# Patient Record
Sex: Female | Born: 1995 | Hispanic: Yes | Marital: Single | Smoking: Never smoker
Health system: Southern US, Community
[De-identification: ages and names within clinical notes are randomized; demographics above are authoritative.]

## PROBLEM LIST (undated history)

## (undated) DIAGNOSIS — J45909 Unspecified asthma, uncomplicated: Secondary | ICD-10-CM

## (undated) DIAGNOSIS — Z923 Personal history of irradiation: Secondary | ICD-10-CM

## (undated) DIAGNOSIS — R519 Headache, unspecified: Secondary | ICD-10-CM

## (undated) DIAGNOSIS — E119 Type 2 diabetes mellitus without complications: Secondary | ICD-10-CM

## (undated) DIAGNOSIS — G43909 Migraine, unspecified, not intractable, without status migrainosus: Secondary | ICD-10-CM

## (undated) DIAGNOSIS — F32A Depression, unspecified: Secondary | ICD-10-CM

## (undated) DIAGNOSIS — F419 Anxiety disorder, unspecified: Secondary | ICD-10-CM

## (undated) DIAGNOSIS — D500 Iron deficiency anemia secondary to blood loss (chronic): Secondary | ICD-10-CM

## (undated) DIAGNOSIS — Z9221 Personal history of antineoplastic chemotherapy: Secondary | ICD-10-CM

## (undated) DIAGNOSIS — D649 Anemia, unspecified: Secondary | ICD-10-CM

## (undated) DIAGNOSIS — C539 Malignant neoplasm of cervix uteri, unspecified: Secondary | ICD-10-CM

## (undated) DIAGNOSIS — N838 Other noninflammatory disorders of ovary, fallopian tube and broad ligament: Secondary | ICD-10-CM

## (undated) HISTORY — PX: TONSILLECTOMY: SUR1361

## (undated) HISTORY — DX: Personal history of irradiation: Z92.3

## (undated) HISTORY — DX: Morbid (severe) obesity due to excess calories: E66.01

## (undated) HISTORY — DX: Other noninflammatory disorders of ovary, fallopian tube and broad ligament: N83.8

---

## 2018-11-10 ENCOUNTER — Other Ambulatory Visit: Payer: Self-pay

## 2018-11-10 ENCOUNTER — Encounter (HOSPITAL_COMMUNITY): Payer: Self-pay | Admitting: *Deleted

## 2018-11-10 ENCOUNTER — Emergency Department (HOSPITAL_COMMUNITY)
Admission: EM | Admit: 2018-11-10 | Discharge: 2018-11-10 | Disposition: A | Discharge status: Home / Self Care | Payer: Medicaid Other | Attending: Emergency Medicine | Admitting: Emergency Medicine

## 2018-11-10 DIAGNOSIS — J45909 Unspecified asthma, uncomplicated: Secondary | ICD-10-CM | POA: Insufficient documentation

## 2018-11-10 DIAGNOSIS — R109 Unspecified abdominal pain: Secondary | ICD-10-CM | POA: Diagnosis present

## 2018-11-10 HISTORY — DX: Unspecified asthma, uncomplicated: J45.909

## 2018-11-10 HISTORY — DX: Anemia, unspecified: D64.9

## 2018-11-10 LAB — URINALYSIS, ROUTINE W REFLEX MICROSCOPIC
Bilirubin Urine: NEGATIVE
Glucose, UA: NEGATIVE mg/dL
Ketones, ur: NEGATIVE mg/dL
Nitrite: NEGATIVE
Protein, ur: 30 mg/dL — AB
RBC / HPF: >50 RBC/hpf — ABNORMAL HIGH (ref 0–5)
Specific Gravity, Urine: 1.015 (ref 1.005–1.030)
WBC, UA: >50 WBC/hpf — ABNORMAL HIGH (ref 0–5)
pH: 5 (ref 5.0–8.0)

## 2018-11-10 LAB — CBC
HCT: 29.7 % — ABNORMAL LOW (ref 36.0–46.0)
Hemoglobin: 7.7 g/dL — ABNORMAL LOW (ref 12.0–15.0)
MCH: 17.5 pg — ABNORMAL LOW (ref 26.0–34.0)
MCHC: 25.9 g/dL — ABNORMAL LOW (ref 30.0–36.0)
MCV: 67.5 fL — ABNORMAL LOW (ref 80.0–100.0)
Platelets: 503 10*3/uL — ABNORMAL HIGH (ref 150–400)
RBC: 4.4 MIL/uL (ref 3.87–5.11)
RDW: 21 % — ABNORMAL HIGH (ref 11.5–15.5)
WBC: 11.5 10*3/uL — ABNORMAL HIGH (ref 4.0–10.5)
nRBC: 0 % (ref 0.0–0.2)

## 2018-11-10 LAB — COMPREHENSIVE METABOLIC PANEL
ALT: 13 U/L (ref 0–44)
AST: 14 U/L — ABNORMAL LOW (ref 15–41)
Albumin: 4.1 g/dL (ref 3.5–5.0)
Alkaline Phosphatase: 107 U/L (ref 38–126)
Anion gap: 8 (ref 5–15)
BUN: 12 mg/dL (ref 6–20)
CO2: 23 mmol/L (ref 22–32)
Calcium: 9 mg/dL (ref 8.9–10.3)
Chloride: 104 mmol/L (ref 98–111)
Creatinine, Ser: 0.65 mg/dL (ref 0.44–1.00)
GFR calc Af Amer: >60 mL/min (ref >60)
GFR calc non Af Amer: >60 mL/min (ref >60)
Glucose, Bld: 105 mg/dL — ABNORMAL HIGH (ref 70–99)
Potassium: 3.8 mmol/L (ref 3.5–5.1)
Sodium: 135 mmol/L (ref 135–145)
Total Bilirubin: 0.4 mg/dL (ref 0.3–1.2)
Total Protein: 7.9 g/dL (ref 6.5–8.1)

## 2018-11-10 LAB — DIFFERENTIAL
Abs Immature Granulocytes: 0.05 10*3/uL (ref 0.00–0.07)
Basophils Absolute: 0 10*3/uL (ref 0.0–0.1)
Basophils Relative: 0 %
Eosinophils Absolute: 0.4 10*3/uL (ref 0.0–0.5)
Eosinophils Relative: 4 %
Immature Granulocytes: 0 %
Lymphocytes Relative: 31 %
Lymphs Abs: 3.5 10*3/uL (ref 0.7–4.0)
Monocytes Absolute: 0.8 10*3/uL (ref 0.1–1.0)
Monocytes Relative: 7 %
Neutro Abs: 6.7 10*3/uL (ref 1.7–7.7)
Neutrophils Relative %: 58 %

## 2018-11-10 LAB — LIPASE, BLOOD: Lipase: 31 U/L (ref 11–51)

## 2018-11-10 NOTE — Discharge Instructions (Addendum)
Your abdominal cramping resolved while here in the ED.  Follow up with a primary care provider for any further management. Follow-up with your primary care provider or OB/GYN for management of heavy menstrual cycles and your anemia.

## 2018-11-10 NOTE — ED Triage Notes (Signed)
Pt c/o sudden, stabbing pain in abdomen x 2 hours. Denies n/v/d.

## 2018-11-10 NOTE — ED Notes (Signed)
POC Preg resulted Negative.

## 2018-11-10 NOTE — ED Provider Notes (Signed)
[facility] EMERGENCY DEPARTMENT Provider Note   CSN: [id number] Arrival date & time: 11/10/18  1826     History   Chief Complaint Chief Complaint  Patient presents with  . Abdominal Pain    HPI Alyssa Becker is a 23 y.o. female.     HPI   Alyssa Becker is a 23 y.o. female, with a history of asthma and anemia, presenting to the ED with generalized abdominal cramping beginning around 5 PM today.  Symptoms resolved while in the waiting room around 7 PM and have not recurred. Patient just finished her menstrual cycle couple days ago.  She states she has heavy cycles that contribute to her anemia.  Denies fever/chills, N/V/C/D, shortness of breath, cough, chest pain, flank/back pain, urinary symptoms, abnormal vaginal discharge/bleeding, dizziness, syncope, or any other complaints.   Past Medical History:  Diagnosis Date  . Anemia   . Asthma     There are no active problems to display for this patient.   History reviewed. No pertinent surgical history.   OB History   No obstetric history on file.      Home Medications    Prior to Admission medications   Not on File    Family History No family history on file.  Social History Social History   Tobacco Use  . Smoking status: Never Smoker  . Smokeless tobacco: Never Used  Substance Use Topics  . Alcohol use: Never    Frequency: Never  . Drug use: Never     Allergies   Pineapple   Review of Systems Review of Systems  Constitutional: Negative for chills, diaphoresis and fever.  Respiratory: Negative for cough and shortness of breath.   Cardiovascular: Negative for chest pain.  Gastrointestinal: Positive for abdominal pain. Negative for blood in stool, constipation, diarrhea, nausea and vomiting.  Genitourinary: Negative for dysuria, flank pain, hematuria, vaginal bleeding and vaginal discharge.  Musculoskeletal: Negative for back pain.  Neurological: Negative for syncope and weakness.   All other systems reviewed and are negative.    Physical Exam Updated Vital Signs BP (!) 152/86 (BP Location: Right Arm)   Pulse 89   Temp 98.4 F (36.9 C) (Oral)   Resp 16   Ht 5\' 3"  (1.6 m)   Wt 99.8 kg   LMP 11/03/2018   SpO2 100%   BMI 38.97 kg/m   Physical Exam Vitals signs and nursing note reviewed.  Constitutional:      General: She is not in acute distress.    Appearance: She is well-developed. She is not diaphoretic.  HENT:     Head: Normocephalic and atraumatic.     Mouth/Throat:     Mouth: Mucous membranes are moist.     Pharynx: Oropharynx is clear.  Eyes:     Conjunctiva/sclera: Conjunctivae normal.  Neck:     Musculoskeletal: Neck supple.  Cardiovascular:     Rate and Rhythm: Normal rate and regular rhythm.     Pulses: Normal pulses.     Heart sounds: Normal heart sounds.  Pulmonary:     Effort: Pulmonary effort is normal. No respiratory distress.     Breath sounds: Normal breath sounds.  Abdominal:     Palpations: Abdomen is soft.     Tenderness: There is no abdominal tenderness. There is no guarding.  Musculoskeletal:     Right lower leg: No edema.     Left lower leg: No edema.  Lymphadenopathy:     Cervical: No cervical adenopathy.  Skin:  General: Skin is warm and dry.  Neurological:     Mental Status: She is alert.  Psychiatric:        Mood and Affect: Mood and affect normal.        Speech: Speech normal.        Behavior: Behavior normal.      ED Treatments / Results  Labs (all labs ordered are listed, but only abnormal results are displayed) Labs Reviewed  COMPREHENSIVE METABOLIC PANEL - Abnormal; Notable for the following components:      Result Value   Glucose, Bld 105 (*)    AST 14 (*)    All other components within normal limits  CBC - Abnormal; Notable for the following components:   WBC 11.5 (*)    Hemoglobin 7.7 (*)    HCT 29.7 (*)    MCV 67.5 (*)    MCH 17.5 (*)    MCHC 25.9 (*)    RDW 21.0 (*)    Platelets  503 (*)    All other components within normal limits  URINALYSIS, ROUTINE W REFLEX MICROSCOPIC - Abnormal; Notable for the following components:   APPearance CLOUDY (*)    Hgb urine dipstick LARGE (*)    Protein, ur 30 (*)    Leukocytes,Ua MODERATE (*)    RBC / HPF >50 (*)    WBC, UA >50 (*)    Bacteria, UA RARE (*)    Non Squamous Epithelial 0-5 (*)    All other components within normal limits  URINE CULTURE  LIPASE, BLOOD  DIFFERENTIAL  POC URINE PREG, ED    EKG None  Radiology No results found.  Procedures Procedures (including critical care time)  Medications Ordered in ED Medications - No data to display   Initial Impression / Assessment and Plan / ED Course  I have reviewed the triage vital signs and the nursing notes.  Pertinent labs & imaging results that were available during my care of the patient were reviewed by me and considered in my medical decision making (see chart for details).         Patient presents with abdominal cramping. Patient is nontoxic appearing, afebrile, not tachycardic, not tachypneic, not hypotensive, maintains excellent SPO2 on room air, and is in no apparent distress.  Pain resolved shortly after arrival in the waiting room and did not recur. She has a completely benign abdominal exam. She has an anemia that patient states is typical for her.  Informed her that this level of anemia would need to be tracked more closely by a primary care provider.  I suspect it may be due to her heavy menstrual cycles, which would need to be addressed by OB/GYN. Return precautions discussed. Patient voices understanding of these instructions, accepts the plan, and is comfortable with discharge.  Urine pregnancy reportedly negative by RN Marita Kansas, however, this result was unable to be imported into the computer system.   Vitals:   11/10/18 1844 11/10/18 1845 11/10/18 2306  BP:  (!) 152/86 122/67  Pulse:  89 71  Resp:  16 16  Temp:  98.4 F (36.9 C)    TempSrc:  Oral   SpO2:  100% 97%  Weight: 99.8 kg    Height: 5\' 3"  (1.6 m)       Final Clinical Impressions(s) / ED Diagnoses   Final diagnoses:  Abdominal cramping    ED Discharge Orders    None       Lorayne Bender, PA-C 11/11/18 0023  Julianne Rice, MD 11/15/18 [phone]

## 2018-11-12 LAB — URINE CULTURE: Culture: >=100000 — AB

## 2018-11-12 LAB — POC URINE PREG, ED: Preg Test, Ur: NEGATIVE

## 2018-11-13 ENCOUNTER — Telehealth: Payer: Self-pay | Admitting: Emergency Medicine

## 2018-11-13 NOTE — Progress Notes (Signed)
ED Antimicrobial Stewardship Positive Culture Follow Up   Alyssa Becker is an 23 y.o. female who presented to [facility] on 11/10/2018 with a chief complaint of  Chief Complaint  Patient presents with  . Abdominal Pain    Recent Results (from the past 720 hour(s))  Urine culture     Status: Abnormal   Collection Time: 11/10/18 11:08 PM   Specimen: Urine, Clean Catch  Result Value Ref Range Status   Specimen Description   Final    URINE, CLEAN CATCH Performed at [facility], [address] [place], [place] [address]    Special Requests   Final    NONE Performed at [facility], [address] [place], [place] [address]    Culture (A)  Final    >=100,000 COLONIES/mL DIPHTHEROIDS(CORYNEBACTERIUM SPECIES) Standardized susceptibility testing for this organism is not available. Performed at [facility], [place] [address], [place], [place] [address]    Report Status 11/12/2018 FINAL  Final   [x]  Patient discharged originally without antimicrobial agent  New antibiotic prescription: No further treatment indicated for asymptomatic bacteriuria  ED Provider: Janetta Hora, PA-C   Romona Curls 11/13/2018, 10:51 AM Clinical Pharmacist Monday - Friday phone -  [phone] Saturday - Sunday phone - [phone]

## 2018-11-13 NOTE — Telephone Encounter (Signed)
Post ED Visit - Positive Culture Follow-up  Culture report reviewed by antimicrobial stewardship pharmacist: [place] Team []  Elenor Quinones, Pharm.D. []  Heide Guile, Pharm.D., BCPS AQ-ID []  Parks Neptune, Pharm.D., BCPS []  Alycia Rossetti, Pharm.D., BCPS []  [place], Pharm.D., BCPS, AAHIVP []  Legrand Como, Pharm.D., BCPS, AAHIVP []  Salome Arnt, PharmD, BCPS []  Johnnette Gourd, PharmD, BCPS []  Hughes Better, PharmD, BCPS [x]  Elicia Lamp, PharmD []  Laqueta Linden, PharmD, BCPS []  Albertina Parr, PharmD  [place] Team []  Leodis Sias, PharmD []  Lindell Spar, PharmD []  Royetta Asal, PharmD []  Graylin Shiver, Rph []  Rema Fendt) Glennon Mac, PharmD []  Arlyn Dunning, PharmD []  Netta Cedars, PharmD []  Dia Sitter, PharmD []  Leone Haven, PharmD []  Gretta Arab, PharmD []  Theodis Shove, PharmD []  Peggyann Juba, PharmD []  Reuel Boom, PharmD   Positive urine culture  no further patient follow-up is required at this time.  [name] Alyssa Becker 11/13/2018, 6:07 PM

## 2019-03-15 ENCOUNTER — Emergency Department (HOSPITAL_COMMUNITY)
Admission: EM | Admit: 2019-03-15 | Discharge: 2019-03-15 | Disposition: A | Discharge status: Home / Self Care | Payer: Medicaid Other | Attending: Emergency Medicine | Admitting: Emergency Medicine

## 2019-03-15 ENCOUNTER — Other Ambulatory Visit: Payer: Self-pay

## 2019-03-15 ENCOUNTER — Encounter (HOSPITAL_COMMUNITY): Payer: Self-pay

## 2019-03-15 DIAGNOSIS — D649 Anemia, unspecified: Secondary | ICD-10-CM | POA: Insufficient documentation

## 2019-03-15 DIAGNOSIS — R42 Dizziness and giddiness: Secondary | ICD-10-CM | POA: Diagnosis present

## 2019-03-15 DIAGNOSIS — Z79899 Other long term (current) drug therapy: Secondary | ICD-10-CM | POA: Diagnosis not present

## 2019-03-15 DIAGNOSIS — N920 Excessive and frequent menstruation with regular cycle: Secondary | ICD-10-CM | POA: Diagnosis not present

## 2019-03-15 DIAGNOSIS — J45909 Unspecified asthma, uncomplicated: Secondary | ICD-10-CM | POA: Diagnosis not present

## 2019-03-15 LAB — BASIC METABOLIC PANEL
Anion gap: 7 (ref 5–15)
BUN: 13 mg/dL (ref 6–20)
CO2: 26 mmol/L (ref 22–32)
Calcium: 8.7 mg/dL — ABNORMAL LOW (ref 8.9–10.3)
Chloride: 103 mmol/L (ref 98–111)
Creatinine, Ser: 0.63 mg/dL (ref 0.44–1.00)
GFR calc Af Amer: >60 mL/min (ref >60)
GFR calc non Af Amer: >60 mL/min (ref >60)
Glucose, Bld: 145 mg/dL — ABNORMAL HIGH (ref 70–99)
Potassium: 3.5 mmol/L (ref 3.5–5.1)
Sodium: 136 mmol/L (ref 135–145)

## 2019-03-15 LAB — CBC WITH DIFFERENTIAL/PLATELET
Abs Immature Granulocytes: 0.04 10*3/uL (ref 0.00–0.07)
Basophils Absolute: 0 10*3/uL (ref 0.0–0.1)
Basophils Relative: 0 %
Eosinophils Absolute: 0.2 10*3/uL (ref 0.0–0.5)
Eosinophils Relative: 1 %
HCT: 27.5 % — ABNORMAL LOW (ref 36.0–46.0)
Hemoglobin: 7.6 g/dL — ABNORMAL LOW (ref 12.0–15.0)
Immature Granulocytes: 0 %
Lymphocytes Relative: 34 %
Lymphs Abs: 3.7 10*3/uL (ref 0.7–4.0)
MCH: 17.3 pg — ABNORMAL LOW (ref 26.0–34.0)
MCHC: 27.6 g/dL — ABNORMAL LOW (ref 30.0–36.0)
MCV: 62.5 fL — ABNORMAL LOW (ref 80.0–100.0)
Monocytes Absolute: 0.5 10*3/uL (ref 0.1–1.0)
Monocytes Relative: 4 %
Neutro Abs: 6.6 10*3/uL (ref 1.7–7.7)
Neutrophils Relative %: 61 %
Platelets: 400 10*3/uL (ref 150–400)
RBC: 4.4 MIL/uL (ref 3.87–5.11)
RDW: 20.4 % — ABNORMAL HIGH (ref 11.5–15.5)
WBC: 11 10*3/uL — ABNORMAL HIGH (ref 4.0–10.5)
nRBC: 0 % (ref 0.0–0.2)

## 2019-03-15 LAB — POC URINE PREG, ED: Preg Test, Ur: NEGATIVE

## 2019-03-15 MED ORDER — SODIUM CHLORIDE 0.9 % IV BOLUS
1000.0000 mL | Freq: Once | INTRAVENOUS | Status: AC
  Administered 2019-03-15: 1000 mL via INTRAVENOUS

## 2019-03-15 NOTE — ED Notes (Signed)
Pt is aware we need urine sample.  

## 2019-03-15 NOTE — ED Provider Notes (Signed)
[facility] Provider Note   CSN: [id number] Arrival date & time: 03/15/19  0715     History Chief Complaint  Patient presents with  . Dizziness    Alyssa Becker is a 24 y.o. female with a history of asthma and anemia associated with menorrhagia presenting with a 1 week history of increasing lightheadedness which she describes as transient dizziness with positional changes without near or full syncope.  She has a history of very heavy menstrual flow, historically has had to have blood transfusions for this problem, received 2 units of blood when living in [place].  Her current menses started yesterday and states it has been heavier than normal.  She denies chest pain, shortness of breath abdominal pain, fevers or chills.  She has had some typical menstrual cramping for which she has taken Tylenol.  She initially reports having to change her pad every 5 minutes since yesterday, however has been here over an hour and has not needed to change her pad, stating she is not currently bleeding.  She has had no GYN evaluation for her heavy periods.  Denies vaginal discharge or risk for STDs.  She established care with a pcp in [place] last month and she was prescribed iron supplementation at this visit which she started taking yesterday 325mg .    The history is provided by the patient.       Past Medical History:  Diagnosis Date  . Anemia   . Asthma     There are no problems to display for this patient.   History reviewed. No pertinent surgical history.   OB History   No obstetric history on file.     No family history on file.  Social History   Tobacco Use  . Smoking status: Never Smoker  . Smokeless tobacco: Never Used  Substance Use Topics  . Alcohol use: Never  . Drug use: Never    Home Medications Prior to Admission medications   Medication Sig Start Date End Date Taking? Authorizing Provider  albuterol (VENTOLIN HFA) 108 (90 Base) MCG/ACT  inhaler Inhale 1-2 puffs into the lungs every 6 (six) hours as needed for wheezing or shortness of breath.   Yes [provider]  ferrous sulfate 325 (65 FE) MG tablet Take 325 mg by mouth daily.    Yes [provider]    Allergies    Pineapple  Review of Systems   Review of Systems  Constitutional: Negative for chills and fever.  HENT: Negative for congestion and sore throat.   Eyes: Negative.   Respiratory: Negative for chest tightness and shortness of breath.   Cardiovascular: Negative for chest pain.  Gastrointestinal: Negative for abdominal pain, nausea and vomiting.  Genitourinary: Positive for menstrual problem and vaginal bleeding. Negative for dysuria.  Musculoskeletal: Negative for arthralgias, joint swelling and neck pain.  Skin: Negative.  Negative for rash and wound.  Neurological: Positive for light-headedness. Negative for dizziness, syncope, weakness, numbness and headaches.  Psychiatric/Behavioral: Negative.     Physical Exam Updated Vital Signs BP 128/67   Pulse (!) 115   Temp 97.8 F (36.6 C) (Oral)   Resp 18   Ht 5\' 3"  (1.6 m)   Wt 128.4 kg   LMP 03/14/2019   SpO2 100%   BMI 50.13 kg/m   Physical Exam Vitals and nursing note reviewed.  Constitutional:      Appearance: She is well-developed.  HENT:     Head: Normocephalic and atraumatic.  Eyes:  General: No scleral icterus.    Conjunctiva/sclera: Conjunctivae normal.     Comments: Mild conjunctival pallor.   Cardiovascular:     Rate and Rhythm: Regular rhythm. Tachycardia present.     Heart sounds: Normal heart sounds.  Pulmonary:     Effort: Pulmonary effort is normal.     Breath sounds: Normal breath sounds. No wheezing.  Abdominal:     General: Bowel sounds are normal.     Palpations: Abdomen is soft. There is no mass.     Tenderness: There is no abdominal tenderness. There is no guarding.  Genitourinary:    Comments: deferred Musculoskeletal:        General: Normal  range of motion.     Cervical back: Normal range of motion.  Skin:    General: Skin is warm and dry.  Neurological:     Mental Status: She is alert.     ED Results / Procedures / Treatments   Labs (all labs ordered are listed, but only abnormal results are displayed) Labs Reviewed  CBC WITH DIFFERENTIAL/PLATELET - Abnormal; Notable for the following components:      Result Value   WBC 11.0 (*)    Hemoglobin 7.6 (*)    HCT 27.5 (*)    MCV 62.5 (*)    MCH 17.3 (*)    MCHC 27.6 (*)    RDW 20.4 (*)    All other components within normal limits  BASIC METABOLIC PANEL - Abnormal; Notable for the following components:   Glucose, Bld 145 (*)    Calcium 8.7 (*)    All other components within normal limits  POC URINE PREG, ED    EKG None  Radiology No results found.  Procedures Procedures (including critical care time)  Medications Ordered in ED Medications  sodium chloride 0.9 % bolus 1,000 mL (0 mLs Intravenous Stopped 03/15/19 1244)  sodium chloride 0.9 % bolus 1,000 mL (1,000 mLs Intravenous New Bag/Given 03/15/19 1244)    ED Course  I have reviewed the triage vital signs and the nursing notes.  Pertinent labs & imaging results that were available during my care of the patient were reviewed by me and considered in my medical decision making (see chart for details).    MDM Rules/Calculators/A&P                      Patient with chronic significant anemia felt secondary to heavy periods.  Her lab tests today are stable, although she is significantly anemic with a hemoglobin of 7.6 this is stable compared to hemoglobin of 7.7 which was measured here in October.  She was tilt positive for orthostatics, was given 2 L of normal saline after which time she felt improved.  It was not felt she required blood transfusion at this time, especially since she has just started taking iron supplementation just yesterday and her symptoms are not severe enough to require acute transfusion.   We discussed referral to GYN for further evaluation of her menorrhagia which she is willing to do.  [facility]  She was encouraged to continue her iron supplementation.   Final Clinical Impression(s) / ED Diagnoses Final diagnoses:  Lightheadedness  Chronic anemia  Menorrhagia with regular cycle    Rx / DC Orders ED Discharge Orders    None       Landis Martins 03/15/19 1313    Ezequiel Essex, MD 03/15/19 [phone]

## 2019-03-15 NOTE — Discharge Instructions (Addendum)
Continue taking your iron supplementation and make sure you are staying hydrated by drinking plenty of fluids.  Your labs today are stable but as discussed you are significantly anemic but there is no reason to suspect that your body will not improve this now that you have started your iron supplement.  Take this daily. I recommend followup with a gynecologist for further management of your heavy periods which can also help improve your anemia.

## 2019-03-15 NOTE — ED Triage Notes (Signed)
Pt reports feeling dizzy x 1 week.  Pt says felt like she was going to pass out yesterday.  Pt started period yesterday and says is heavier than normal.  Denies any n/v/d, cough, or fever.

## 2019-04-07 ENCOUNTER — Encounter (HOSPITAL_COMMUNITY): Payer: Self-pay | Admitting: Hematology

## 2019-04-07 ENCOUNTER — Other Ambulatory Visit: Payer: Self-pay

## 2019-04-07 ENCOUNTER — Inpatient Hospital Stay (HOSPITAL_COMMUNITY): Payer: Medicaid Other

## 2019-04-07 ENCOUNTER — Inpatient Hospital Stay (HOSPITAL_COMMUNITY): Payer: Medicaid Other | Attending: Hematology | Admitting: Hematology

## 2019-04-07 VITALS — BP 158/73 | HR 116 | Temp 97.3°F | Resp 18 | Ht 66.0 in | Wt 299.4 lb

## 2019-04-07 DIAGNOSIS — Z79899 Other long term (current) drug therapy: Secondary | ICD-10-CM | POA: Diagnosis not present

## 2019-04-07 DIAGNOSIS — D508 Other iron deficiency anemias: Secondary | ICD-10-CM | POA: Insufficient documentation

## 2019-04-07 DIAGNOSIS — N920 Excessive and frequent menstruation with regular cycle: Secondary | ICD-10-CM | POA: Diagnosis not present

## 2019-04-07 DIAGNOSIS — D72829 Elevated white blood cell count, unspecified: Secondary | ICD-10-CM | POA: Diagnosis not present

## 2019-04-07 DIAGNOSIS — J45909 Unspecified asthma, uncomplicated: Secondary | ICD-10-CM | POA: Diagnosis not present

## 2019-04-07 DIAGNOSIS — D509 Iron deficiency anemia, unspecified: Secondary | ICD-10-CM | POA: Diagnosis not present

## 2019-04-07 LAB — CBC WITH DIFFERENTIAL/PLATELET
Abs Immature Granulocytes: 0.04 10*3/uL (ref 0.00–0.07)
Basophils Absolute: 0 10*3/uL (ref 0.0–0.1)
Basophils Relative: 0 %
Eosinophils Absolute: 0.2 10*3/uL (ref 0.0–0.5)
Eosinophils Relative: 2 %
HCT: 31.8 % — ABNORMAL LOW (ref 36.0–46.0)
Hemoglobin: 8 g/dL — ABNORMAL LOW (ref 12.0–15.0)
Immature Granulocytes: 1 %
Lymphocytes Relative: 32 %
Lymphs Abs: 2.7 10*3/uL (ref 0.7–4.0)
MCH: 16 pg — ABNORMAL LOW (ref 26.0–34.0)
MCHC: 25.2 g/dL — ABNORMAL LOW (ref 30.0–36.0)
MCV: 63.6 fL — ABNORMAL LOW (ref 80.0–100.0)
Monocytes Absolute: 0.5 10*3/uL (ref 0.1–1.0)
Monocytes Relative: 6 %
Neutro Abs: 5.1 10*3/uL (ref 1.7–7.7)
Neutrophils Relative %: 59 %
Platelets: 317 10*3/uL (ref 150–400)
RBC: 5 MIL/uL (ref 3.87–5.11)
RDW: 21.1 % — ABNORMAL HIGH (ref 11.5–15.5)
WBC: 8.6 10*3/uL (ref 4.0–10.5)
nRBC: 0 % (ref 0.0–0.2)

## 2019-04-07 LAB — LACTATE DEHYDROGENASE: LDH: 136 U/L (ref 98–192)

## 2019-04-07 LAB — IRON AND TIBC
Iron: 14 ug/dL — ABNORMAL LOW (ref 28–170)
Saturation Ratios: 4 % — ABNORMAL LOW (ref 10.4–31.8)
TIBC: 386 ug/dL (ref 250–450)
UIBC: 372 ug/dL

## 2019-04-07 LAB — RETICULOCYTES
Immature Retic Fract: 22.7 % — ABNORMAL HIGH (ref 2.3–15.9)
RBC.: 4.97 MIL/uL (ref 3.87–5.11)
Retic Count, Absolute: 128.2 10*3/uL (ref 19.0–186.0)
Retic Ct Pct: 2.6 % (ref 0.4–3.1)

## 2019-04-07 LAB — VITAMIN B12: Vitamin B-12: 493 pg/mL (ref 180–914)

## 2019-04-07 LAB — FERRITIN: Ferritin: 4 ng/mL — ABNORMAL LOW (ref 11–307)

## 2019-04-07 LAB — FOLATE: Folate: 14.9 ng/mL (ref >5.9)

## 2019-04-07 NOTE — Patient Instructions (Addendum)
[place] at [facility] Discharge Instructions  You were seen today by Dr. Delton Coombes. He went over your history, family history and how you've been feeling lately. Continue taking the Iron supplement. You will have blood drawn prior to leaving the hospital today. He will schedule you for 2 weekly doses of IV Iron. He will see you back in 3 weeks for follow up.   Thank you for choosing [place] at [facility] to provide your oncology and hematology care.  To afford each patient quality time with our provider, please arrive at least 15 minutes before your scheduled appointment time.   If you have a lab appointment with the [facility] please come in thru the  Main Entrance and check in at the main information desk  You need to re-schedule your appointment should you arrive 10 or more minutes late.  We strive to give you quality time with our providers, and arriving late affects you and other patients whose appointments are after yours.  Also, if you no show three or more times for appointments you may be dismissed from the clinic at the providers discretion.     Again, thank you for choosing [facility].  Our hope is that these requests will decrease the amount of time that you wait before being seen by our physicians.       _____________________________________________________________  Should you have questions after your visit to [facility], please contact our office at [phone] between the hours of 8:00 a.m. and 4:30 p.m.  Voicemails left after 4:00 p.m. will not be returned until the following business day.  For prescription refill requests, have your pharmacy contact our office and allow 72 hours.    Cancer Center Support Programs:   > Cancer Support Group  2nd Tuesday of the month 1pm-2pm, Journey Room

## 2019-04-07 NOTE — Assessment & Plan Note (Addendum)
1.  Severe microcytic anemia: -CBC from 03/15/2019 when she presented to the ER with dizziness showed hemoglobin 7.6 with MCV 62.5. -CBC from 11/10/2018 showed hemoglobin 7.7 with MCV 67. -She denies any fevers, night sweats or weight loss. -He denies any bleeding per rectum or melena. -She has menorrhagia with heavy menses with bleeding lasting 4 to 5 days, heavy, every 28 days. -She reports that she has been taking iron tablet twice daily since 2019.  She denies any constipation. -She reportedly had 2 units of blood transfusion while in [place]. -No family history of recurrent blood transfusions or malignancies. -I think she has difficulty absorbing iron as there is no improvement in hemoglobin despite her taking iron tablet since 2019. -I have recommended parenteral iron therapy with Feraheme.  We will give 2 doses once a week.  We discussed about side effects in detail. -I will check a CBC as a baseline today along with a baseline ferritin, iron panel.  We will also rule out other nutritional deficiencies by checking 123456, folic acid, methylmalonic acid and copper.  Will check LDH reticulocyte count and haptoglobin.  We will also send SPEP. -We will see her back in 3 weeks for follow-up.  2.  Leukocytosis: -She has very mild leukocytosis reported on the 2 CBC she had in epic system. -Her white count was 11 and 11.5 with normal differential. -We will follow up on today's CBC. -Thrombocytosis is reactive secondary to iron deficiency.

## 2019-04-07 NOTE — Progress Notes (Signed)
CONSULT NOTE  Patient Care Team: Patient, No Pcp Per as PCP - General (General Practice)  CHIEF COMPLAINTS/PURPOSE OF CONSULTATION:  Microcytic anemia.  HISTORY OF PRESENTING ILLNESS:  Alyssa Becker 24 y.o. female is seen at the request of Dr. Wyvonnia Dusky for further work-up and management of severe microcytic anemia.  She came to the ER with dizziness.  She was found to have orthostatic hypotension.  Hemoglobin was 7.6 with MCV of 62.5.  She was taking iron tablet twice daily.  She was given 2 L of fluid when her orthostatic symptoms improved.  She reports on and off pressure in the chest when she exerts.  Also reports fatigue all the time.  Appetite is 50%.  Also reports some headaches which are chronic.  She had a history of 2 units of PRBC when she was living in [place].  Reports having heavy menses, lasting 4 to 5 days every 28 days.  She was started on iron tablet in 2019.  She takes it twice daily and denies any complications from it.  Denies any fevers, night sweats or weight loss.  She never had a colonoscopy.  She is not on any antiplatelet drugs.  Her only medication is inhaler for her asthma.  Today she denies any dizziness.  Denies any bleeding per rectum or melena.  Denies any pica.   MEDICAL HISTORY:  Past Medical History:  Diagnosis Date  . Anemia   . Asthma     SURGICAL HISTORY: History reviewed. No pertinent surgical history.  SOCIAL HISTORY: Social History   Socioeconomic History  . Marital status: Single    Spouse name: Not on file  . Number of children: Not on file  . Years of education: Not on file  . Highest education level: Not on file  Occupational History  . Not on file  Tobacco Use  . Smoking status: Never Smoker  . Smokeless tobacco: Never Used  Substance and Sexual Activity  . Alcohol use: Never  . Drug use: Never  . Sexual activity: Not Currently  Other Topics Concern  . Not on file  Social History Narrative  . Not on file   Social  Determinants of Health   Financial Resource Strain:   . Difficulty of Paying Living Expenses:   Food Insecurity:   . Worried About Charity fundraiser in the Last Year:   . Arboriculturist in the Last Year:   Transportation Needs:   . Film/video editor (Medical):   Marland Kitchen Lack of Transportation (Non-Medical):   Physical Activity:   . Days of Exercise per Week:   . Minutes of Exercise per Session:   Stress:   . Feeling of Stress :   Social Connections:   . Frequency of Communication with Friends and Family:   . Frequency of Social Gatherings with Friends and Family:   . Attends Religious Services:   . Active Member of Clubs or Organizations:   . Attends Archivist Meetings:   Marland Kitchen Marital Status:   Intimate Partner Violence:   . Fear of Current or Ex-Partner:   . Emotionally Abused:   Marland Kitchen Physically Abused:   . Sexually Abused:     FAMILY HISTORY: Family History  Problem Relation Age of Onset  . Asthma Mother   . Diabetes Mother   . Hypertension Mother   . Asthma Father   . Diabetes Father   . Kidney disease Father   . Diabetes Sister   . Asthma Sister   .  Asthma Brother   . Diabetes Brother   . Diabetes Maternal Uncle   . Diabetes Maternal Grandmother   . Hypertension Maternal Grandmother   . Anemia Maternal Grandmother   . Heart attack Paternal Grandfather     ALLERGIES:  is allergic to pineapple.  MEDICATIONS:  Current Outpatient Medications  Medication Sig Dispense Refill  . albuterol (VENTOLIN HFA) 108 (90 Base) MCG/ACT inhaler Inhale 1-2 puffs into the lungs every 6 (six) hours as needed for wheezing or shortness of breath.    . ferrous sulfate 325 (65 FE) MG tablet Take 325 mg by mouth daily.      No current facility-administered medications for this visit.    REVIEW OF SYSTEMS:   Constitutional: Denies fevers, chills or abnormal night sweats.  Positive for headaches. Eyes: Denies blurriness of vision, double vision or watery eyes Ears, nose,  mouth, throat, and face: Denies mucositis or sore throat Respiratory: Denies cough, dyspnea or wheezes  Cardiovascular: Denies palpitation, or lower extremity swelling.  Reports chest pressure when she exerts. Gastrointestinal:  Denies nausea, heartburn or change in bowel habits Skin: Denies abnormal skin rashes Lymphatics: Denies new lymphadenopathy or easy bruising Neurological:Denies numbness, tingling or new weaknesses Behavioral/Psych: Mood is stable, no new changes  All other systems were reviewed with the patient and are negative.  PHYSICAL EXAMINATION: ECOG PERFORMANCE STATUS: 1 - Symptomatic but completely ambulatory  Vitals:   04/07/19 1304  BP: (!) 158/73  Pulse: (!) 116  Resp: 18  Temp: (!) 97.3 F (36.3 C)  SpO2: 100%   Filed Weights   04/07/19 1304  Weight: 299 lb 6.4 oz (135.8 kg)    GENERAL:alert, no distress and comfortable SKIN: skin color, texture, turgor are normal, no rashes or significant lesions EYES: normal, conjunctiva are pink and non-injected, sclera clear OROPHARYNX:no exudate, no erythema and lips, buccal mucosa, and tongue normal  NECK: supple, thyroid normal size, non-tender, without nodularity LYMPH:  no palpable lymphadenopathy in the cervical, axillary or inguinal LUNGS: clear to auscultation and percussion with normal breathing effort HEART: regular rate & rhythm and no murmurs and no lower extremity edema.  Tachycardia present. ABDOMEN:abdomen soft, non-tender and normal bowel sounds Musculoskeletal:no cyanosis of digits and no clubbing  PSYCH: alert & oriented x 3 with fluent speech NEURO: no focal motor/sensory deficits  LABORATORY DATA:  I have reviewed the data as listed Recent Results (from the past 2160 hour(s))  CBC with Differential     Status: Abnormal   Collection Time: 03/15/19  9:17 AM  Result Value Ref Range   WBC 11.0 (H) 4.0 - 10.5 K/uL   RBC 4.40 3.87 - 5.11 MIL/uL   Hemoglobin 7.6 (L) 12.0 - 15.0 g/dL    Comment:  Reticulocyte Hemoglobin testing may be clinically indicated, consider ordering this additional test [id number]    HCT 27.5 (L) 36.0 - 46.0 %   MCV 62.5 (L) 80.0 - 100.0 fL   MCH 17.3 (L) 26.0 - 34.0 pg   MCHC 27.6 (L) 30.0 - 36.0 g/dL   RDW 20.4 (H) 11.5 - 15.5 %   Platelets 400 150 - 400 K/uL    Comment: PLATELET COUNT CONFIRMED BY SMEAR SPECIMEN CHECKED FOR CLOTS    nRBC 0.0 0.0 - 0.2 %   Neutrophils Relative % 61 %   Neutro Abs 6.6 1.7 - 7.7 K/uL   Lymphocytes Relative 34 %   Lymphs Abs 3.7 0.7 - 4.0 K/uL   Monocytes Relative 4 %   Monocytes Absolute 0.5 0.1 -  1.0 K/uL   Eosinophils Relative 1 %   Eosinophils Absolute 0.2 0.0 - 0.5 K/uL   Basophils Relative 0 %   Basophils Absolute 0.0 0.0 - 0.1 K/uL   Immature Granulocytes 0 %   Abs Immature Granulocytes 0.04 0.00 - 0.07 K/uL   Reactive, Benign Lymphocytes PRESENT    Schistocytes PRESENT    Tear Drop Cells PRESENT    Polychromasia PRESENT    Target Cells PRESENT     Comment: Performed at [facility], [address] [place], [place] [id number]  Basic metabolic panel     Status: Abnormal   Collection Time: 03/15/19  9:17 AM  Result Value Ref Range   Sodium 136 135 - 145 mmol/L   Potassium 3.5 3.5 - 5.1 mmol/L   Chloride 103 98 - 111 mmol/L   CO2 26 22 - 32 mmol/L   Glucose, Bld 145 (H) 70 - 99 mg/dL    Comment: Glucose reference range applies only to samples taken after fasting for at least 8 hours.   BUN 13 6 - 20 mg/dL   Creatinine, Ser 0.63 0.44 - 1.00 mg/dL   Calcium 8.7 (L) 8.9 - 10.3 mg/dL   GFR calc non Af Amer >60 >60 mL/min   GFR calc Af Amer >60 >60 mL/min   Anion gap 7 5 - 15    Comment: Performed at [facility], [address] [place], [place] [address]  POC urine preg, ED (not at [facility])     Status: None   Collection Time: 03/15/19 12:18 PM  Result Value Ref Range   Preg Test, Ur NEGATIVE NEGATIVE    Comment:        THE SENSITIVITY OF THIS METHODOLOGY IS >24 mIU/mL       ASSESSMENT & PLAN:   Microcytic anemia 1.  Severe microcytic anemia: -CBC from 03/15/2019 when she presented to the ER with dizziness showed hemoglobin 7.6 with MCV 62.5. -CBC from 11/10/2018 showed hemoglobin 7.7 with MCV 67. -She denies any fevers, night sweats or weight loss. -He denies any bleeding per rectum or melena. -She has menorrhagia with heavy menses with bleeding lasting 4 to 5 days, heavy, every 28 days. -She reports that she has been taking iron tablet twice daily since 2019.  She denies any constipation. -She reportedly had 2 units of blood transfusion while in [place]. -No family history of recurrent blood transfusions or malignancies. -I think she has difficulty absorbing iron as there is no improvement in hemoglobin despite her taking iron tablet since 2019. -I have recommended parenteral iron therapy with Feraheme.  We will give 2 doses once a week.  We discussed about side effects in detail. -I will check a CBC as a baseline today along with a baseline ferritin, iron panel.  We will also rule out other nutritional deficiencies by checking [id number], folic acid, methylmalonic acid and copper.  Will check LDH reticulocyte count and haptoglobin.  We will also send SPEP. -We will see her back in 3 weeks for follow-up.  2.  Leukocytosis: -She has very mild leukocytosis reported on the 2 CBC she had in epic system. -Her white count was 11 and 11.5 with normal differential. -We will follow up on today's CBC. -Thrombocytosis is reactive secondary to iron deficiency.     All questions were answered. The patient knows to call the clinic with any problems, questions or concerns.      Derek Jack, MD 04/07/19 1:54 PM

## 2019-04-08 ENCOUNTER — Inpatient Hospital Stay (HOSPITAL_COMMUNITY): Payer: Medicaid Other

## 2019-04-08 ENCOUNTER — Encounter (HOSPITAL_COMMUNITY): Payer: Self-pay

## 2019-04-08 VITALS — BP 131/56 | HR 88 | Temp 97.9°F | Resp 18

## 2019-04-08 DIAGNOSIS — D508 Other iron deficiency anemias: Secondary | ICD-10-CM | POA: Diagnosis not present

## 2019-04-08 DIAGNOSIS — D509 Iron deficiency anemia, unspecified: Secondary | ICD-10-CM

## 2019-04-08 LAB — HAPTOGLOBIN: Haptoglobin: 174 mg/dL (ref 33–278)

## 2019-04-08 MED ORDER — SODIUM CHLORIDE 0.9 % IV SOLN: Freq: Once | INTRAVENOUS | Status: AC

## 2019-04-08 MED ORDER — SODIUM CHLORIDE 0.9 % IV SOLN
510.0000 mg | Freq: Once | INTRAVENOUS | Status: AC
  Administered 2019-04-08: 510 mg via INTRAVENOUS
  Filled 2019-04-08: qty 510

## 2019-04-08 NOTE — Progress Notes (Signed)
Alyssa Becker tolerated Feraheme infusion well without complaints or incident. Peripheral IV site checked with positive blood return noted prior to and after infusion. VSS upon discharge. Pt discharged self ambulatory in satisfactory condition

## 2019-04-08 NOTE — Patient Instructions (Signed)
[facility] at [facility] Discharge Instructions  Received Feraheme infusion today. Follow-up as scheduled. Call clinic for any questions or concerns   Thank you for choosing [facility] at [facility] to provide your oncology and hematology care.  To afford each patient quality time with our provider, please arrive at least 15 minutes before your scheduled appointment time.   If you have a lab appointment with the Cancer Center please come in thru the Main Entrance and check in at the main information desk.  You need to re-schedule your appointment should you arrive 10 or more minutes late.  We strive to give you quality time with our providers, and arriving late affects you and other patients whose appointments are after yours.  Also, if you no show three or more times for appointments you may be dismissed from the clinic at the providers discretion.     Again, thank you for choosing [facility].  Our hope is that these requests will decrease the amount of time that you wait before being seen by our physicians.       _____________________________________________________________  Should you have questions after your visit to  Cancer Center, please contact our office at [phone] between the hours of 8:00 a.m. and 4:30 p.m.  Voicemails left after 4:00 p.m. will not be returned until the following business day.  For prescription refill requests, have your pharmacy contact our office and allow 72 hours.    Due to Covid, you will need to wear a mask upon entering the hospital. If you do not have a mask, a mask will be given to you at the Main Entrance upon arrival. For doctor visits, patients may have 1 support person with them. For treatment visits, patients can not have anyone with them due to social distancing guidelines and our immunocompromised population.     

## 2019-04-11 LAB — PROTEIN ELECTROPHORESIS, SERUM
A/G Ratio: 1.1 (ref 0.7–1.7)
Albumin ELP: 3.6 g/dL (ref 2.9–4.4)
Alpha-1-Globulin: 0.3 g/dL (ref 0.0–0.4)
Alpha-2-Globulin: 0.7 g/dL (ref 0.4–1.0)
Beta Globulin: 1.1 g/dL (ref 0.7–1.3)
Gamma Globulin: 1.3 g/dL (ref 0.4–1.8)
Globulin, Total: 3.4 g/dL (ref 2.2–3.9)
Total Protein ELP: 7 g/dL (ref 6.0–8.5)

## 2019-04-12 LAB — COPPER, SERUM: Copper: 156 ug/dL (ref 80–158)

## 2019-04-15 ENCOUNTER — Encounter (HOSPITAL_COMMUNITY): Payer: Self-pay

## 2019-04-15 ENCOUNTER — Inpatient Hospital Stay (HOSPITAL_COMMUNITY): Payer: Medicaid Other

## 2019-04-15 ENCOUNTER — Other Ambulatory Visit: Payer: Self-pay

## 2019-04-15 VITALS — BP 110/79 | HR 87 | Temp 96.9°F | Resp 18

## 2019-04-15 DIAGNOSIS — D508 Other iron deficiency anemias: Secondary | ICD-10-CM | POA: Diagnosis not present

## 2019-04-15 DIAGNOSIS — D509 Iron deficiency anemia, unspecified: Secondary | ICD-10-CM

## 2019-04-15 LAB — METHYLMALONIC ACID, SERUM: Methylmalonic Acid, Quantitative: 134 nmol/L (ref 0–378)

## 2019-04-15 MED ORDER — SODIUM CHLORIDE 0.9 % IV SOLN
510.0000 mg | Freq: Once | INTRAVENOUS | Status: AC
  Administered 2019-04-15: 510 mg via INTRAVENOUS
  Filled 2019-04-15: qty 510

## 2019-04-15 MED ORDER — SODIUM CHLORIDE 0.9 % IV SOLN: Freq: Once | INTRAVENOUS | Status: AC

## 2019-04-15 NOTE — Patient Instructions (Signed)
[facility] at [facility]  Discharge Instructions:   _______________________________________________________________  Thank you for choosing [facility] at [facility] to provide your oncology and hematology care.  To afford each patient quality time with our providers, please arrive at least 15 minutes before your scheduled appointment.  You need to re-schedule your appointment if you arrive 10 or more minutes late.  We strive to give you quality time with our providers, and arriving late affects you and other patients whose appointments are after yours.  Also, if you no show three or more times for appointments you may be dismissed from the clinic.  Again, thank you for choosing [facility] at [facility]. Our hope is that these requests will allow you access to exceptional care and in a timely manner. _______________________________________________________________  If you have questions after your visit, please contact our office at [phone] between the hours of 8:30 a.m. and 5:00 p.m. Voicemails left after 4:30 p.m. will not be returned until the following business day. _______________________________________________________________  For prescription refill requests, have your pharmacy contact our office. _______________________________________________________________  Recommendations made by the consultant and any test results will be sent to your referring physician. _______________________________________________________________ 

## 2019-04-15 NOTE — Progress Notes (Signed)
Patient presents today for Feraheme infusion. Vital signs stable. Patient has no complaints of any significant changes since her last visit. Patient states she has received IRON in the past with no complications.   Feraheme given today per MD orders. Tolerated infusion without adverse affects. Vital signs stable. No complaints at this time. Discharged from clinic ambulatory. F/U with [facility] as scheduled.

## 2019-04-21 ENCOUNTER — Inpatient Hospital Stay (HOSPITAL_COMMUNITY): Payer: Medicaid Other | Attending: Hematology | Admitting: Hematology

## 2019-05-16 ENCOUNTER — Other Ambulatory Visit: Payer: Self-pay

## 2019-05-16 ENCOUNTER — Ambulatory Visit: Payer: Medicaid Other | Attending: Internal Medicine

## 2019-05-16 DIAGNOSIS — Z20822 Contact with and (suspected) exposure to covid-19: Secondary | ICD-10-CM

## 2019-05-17 ENCOUNTER — Telehealth: Payer: Self-pay

## 2019-05-17 LAB — NOVEL CORONAVIRUS, NAA: SARS-CoV-2, NAA: NOT DETECTED

## 2019-05-17 LAB — SARS-COV-2, NAA 2 DAY TAT

## 2019-05-17 NOTE — Telephone Encounter (Signed)
Given COVID 19 results, verbalizes understanding. 

## 2019-05-18 ENCOUNTER — Emergency Department (HOSPITAL_COMMUNITY): Payer: Medicaid Other

## 2019-05-18 ENCOUNTER — Other Ambulatory Visit: Payer: Self-pay

## 2019-05-18 ENCOUNTER — Emergency Department (HOSPITAL_COMMUNITY)
Admission: EM | Admit: 2019-05-18 | Discharge: 2019-05-19 | Disposition: A | Discharge status: Home / Self Care | Payer: Medicaid Other | Attending: Emergency Medicine | Admitting: Emergency Medicine

## 2019-05-18 ENCOUNTER — Encounter (HOSPITAL_COMMUNITY): Payer: Self-pay | Admitting: Emergency Medicine

## 2019-05-18 DIAGNOSIS — J45909 Unspecified asthma, uncomplicated: Secondary | ICD-10-CM | POA: Diagnosis not present

## 2019-05-18 DIAGNOSIS — U071 COVID-19: Secondary | ICD-10-CM | POA: Diagnosis not present

## 2019-05-18 DIAGNOSIS — R42 Dizziness and giddiness: Secondary | ICD-10-CM | POA: Diagnosis present

## 2019-05-18 DIAGNOSIS — Z79899 Other long term (current) drug therapy: Secondary | ICD-10-CM | POA: Diagnosis not present

## 2019-05-18 LAB — CBC WITH DIFFERENTIAL/PLATELET
Abs Immature Granulocytes: 0.11 10*3/uL — ABNORMAL HIGH (ref 0.00–0.07)
Basophils Absolute: 0.1 10*3/uL (ref 0.0–0.1)
Basophils Relative: 1 %
Eosinophils Absolute: 0.2 10*3/uL (ref 0.0–0.5)
Eosinophils Relative: 2 %
HCT: 25.9 % — ABNORMAL LOW (ref 36.0–46.0)
Hemoglobin: 7.7 g/dL — ABNORMAL LOW (ref 12.0–15.0)
Immature Granulocytes: 1 %
Lymphocytes Relative: 17 %
Lymphs Abs: 1.6 10*3/uL (ref 0.7–4.0)
MCH: 21.8 pg — ABNORMAL LOW (ref 26.0–34.0)
MCHC: 29.7 g/dL — ABNORMAL LOW (ref 30.0–36.0)
MCV: 73.2 fL — ABNORMAL LOW (ref 80.0–100.0)
Monocytes Absolute: 0.8 10*3/uL (ref 0.1–1.0)
Monocytes Relative: 8 %
Neutro Abs: 6.8 10*3/uL (ref 1.7–7.7)
Neutrophils Relative %: 71 %
Platelets: 450 10*3/uL — ABNORMAL HIGH (ref 150–400)
RBC: 3.54 MIL/uL — ABNORMAL LOW (ref 3.87–5.11)
RDW: 27.2 % — ABNORMAL HIGH (ref 11.5–15.5)
WBC: 9.5 10*3/uL (ref 4.0–10.5)
nRBC: 0 % (ref 0.0–0.2)

## 2019-05-18 LAB — POC SARS CORONAVIRUS 2 AG -  ED: SARS Coronavirus 2 Ag: POSITIVE — AB

## 2019-05-18 LAB — BASIC METABOLIC PANEL
Anion gap: 8 (ref 5–15)
BUN: 15 mg/dL (ref 6–20)
CO2: 23 mmol/L (ref 22–32)
Calcium: 8.2 mg/dL — ABNORMAL LOW (ref 8.9–10.3)
Chloride: 104 mmol/L (ref 98–111)
Creatinine, Ser: 0.73 mg/dL (ref 0.44–1.00)
GFR calc Af Amer: >60 mL/min (ref >60)
GFR calc non Af Amer: >60 mL/min (ref >60)
Glucose, Bld: 181 mg/dL — ABNORMAL HIGH (ref 70–99)
Potassium: 3.5 mmol/L (ref 3.5–5.1)
Sodium: 135 mmol/L (ref 135–145)

## 2019-05-18 LAB — HCG, SERUM, QUALITATIVE: Preg, Serum: NEGATIVE

## 2019-05-18 MED ORDER — ONDANSETRON HCL 4 MG PO TABS
4.0000 mg | ORAL_TABLET | Freq: Four times a day (QID) | ORAL | 0 refills | Status: DC
Start: 2019-05-18 — End: 2020-10-03

## 2019-05-18 MED ORDER — BENZONATATE 100 MG PO CAPS
100.0000 mg | ORAL_CAPSULE | Freq: Three times a day (TID) | ORAL | 0 refills | Status: AC
Start: 2019-05-18 — End: 2019-05-23

## 2019-05-18 MED ORDER — SODIUM CHLORIDE 0.9 % IV BOLUS
1000.0000 mL | Freq: Once | INTRAVENOUS | Status: AC
  Administered 2019-05-18: 1000 mL via INTRAVENOUS

## 2019-05-18 MED ORDER — ACETAMINOPHEN 500 MG PO TABS
1000.0000 mg | ORAL_TABLET | Freq: Once | ORAL | Status: AC
  Administered 2019-05-18: 1000 mg via ORAL
  Filled 2019-05-18: qty 2

## 2019-05-18 MED ORDER — SODIUM CHLORIDE 0.9 % IV BOLUS
1000.0000 mL | Freq: Once | INTRAVENOUS | Status: AC
  Administered 2019-05-18: 20:00:00 1000 mL via INTRAVENOUS

## 2019-05-18 NOTE — ED Provider Notes (Signed)
[facility] EMERGENCY DEPARTMENT Provider Note   CSN: [id number] Arrival date & time: 05/18/19  1751     History Chief Complaint  Patient presents with  . Dizziness    Alyssa Becker is a 24 y.o. female.  HPI   Patient is a 24 year old female with a history of anemia, asthma, who presents to the emergency department today for evaluation of dizziness described as lightheadedness.  Denies vertiginous symptoms.  States she has felt this way for the last several days.  She is also had vaginal bleeding for the last 8 days since starting her menstrual cycle.  She has a history of heavy menstrual cycles for many many years and has required blood transfusions in the past.  She receives iron infusions for this. States that the heavy bleeding she is currently experiencing is very normal for her during her menses.  Additionally, she tells me that she has been having body aches, nose pain, sore throat, cough, chest wall pain with cough, nausea, vomiting for the last several days.  She states that multiple people in her household have recently been diagnosed with Covid including her significant other.  She states she tested negative earlier in the week however since then her symptoms have progressed.  Past Medical History:  Diagnosis Date  . Anemia   . Asthma     Patient Active Problem List   Diagnosis Date Noted  . Microcytic anemia 04/07/2019    History reviewed. No pertinent surgical history.   OB History   No obstetric history on file.     Family History  Problem Relation Age of Onset  . Asthma Mother   . Diabetes Mother   . Hypertension Mother   . Asthma Father   . Diabetes Father   . Kidney disease Father   . Diabetes Sister   . Asthma Sister   . Asthma Brother   . Diabetes Brother   . Diabetes Maternal Uncle   . Diabetes Maternal Grandmother   . Hypertension Maternal Grandmother   . Anemia Maternal Grandmother   . Heart attack Paternal Grandfather     Social  History   Tobacco Use  . Smoking status: Never Smoker  . Smokeless tobacco: Never Used  Substance Use Topics  . Alcohol use: Never  . Drug use: Never    Home Medications Prior to Admission medications   Medication Sig Start Date End Date Taking? Authorizing Provider  albuterol (VENTOLIN HFA) 108 (90 Base) MCG/ACT inhaler Inhale 1-2 puffs into the lungs every 6 (six) hours as needed for wheezing or shortness of breath.   Yes [provider]  ferrous sulfate 325 (65 FE) MG tablet Take 325 mg by mouth daily.    Yes [provider]  benzonatate (TESSALON) 100 MG capsule Take 1 capsule (100 mg total) by mouth every 8 (eight) hours for 5 days. 05/18/19 05/23/19  Cerinity Zynda S, PA-C  ondansetron (ZOFRAN) 4 MG tablet Take 1 tablet (4 mg total) by mouth every 6 (six) hours. 05/18/19   Ishitha Roper S, PA-C    Allergies    Pineapple  Review of Systems   Review of Systems  Constitutional: Positive for fatigue. Negative for chills and fever.  HENT: Negative for ear pain and sore throat.   Eyes: Negative for pain and visual disturbance.  Respiratory: Positive for cough and shortness of breath.   Cardiovascular: Positive for chest pain.  Gastrointestinal: Positive for nausea and vomiting. Negative for abdominal pain, constipation and diarrhea.  Genitourinary: Positive  for vaginal bleeding. Negative for dysuria and hematuria.  Musculoskeletal: Positive for myalgias.  Skin: Negative for rash.  Neurological: Positive for dizziness and light-headedness.  All other systems reviewed and are negative.   Physical Exam Updated Vital Signs BP 125/75   Pulse (!) 127   Temp 99.8 F (37.7 C) (Oral)   Resp 17   Ht 5\' 5"  (1.651 m)   Wt 133.9 kg   LMP 05/11/2019   SpO2 100%   BMI 49.14 kg/m   Physical Exam Vitals and nursing note reviewed.  Constitutional:      General: She is not in acute distress.    Appearance: She is well-developed.  HENT:     Head: Normocephalic  and atraumatic.     Mouth/Throat:     Mouth: Mucous membranes are moist.     Pharynx: No oropharyngeal exudate or posterior oropharyngeal erythema.  Eyes:     Conjunctiva/sclera: Conjunctivae normal.  Cardiovascular:     Rate and Rhythm: Regular rhythm. Tachycardia present.     Heart sounds: Normal heart sounds. No murmur.  Pulmonary:     Effort: Pulmonary effort is normal. No respiratory distress.     Breath sounds: Normal breath sounds. No wheezing, rhonchi or rales.  Abdominal:     General: Bowel sounds are normal.     Palpations: Abdomen is soft.     Tenderness: There is no abdominal tenderness. There is no guarding or rebound.  Genitourinary:    Comments: deferred Musculoskeletal:     Cervical back: Neck supple.  Skin:    General: Skin is warm and dry.  Neurological:     Mental Status: She is alert.     Comments: Mental Status:  Alert, thought content appropriate, able to give a coherent history. Speech fluent without evidence of aphasia. Able to follow 2 step commands without difficulty.  Cranial Nerves:  II:  Pupils equal, round, reactive to light III,IV, VI: ptosis not present, extra-ocular motions intact bilaterally  V,VII: smile symmetric, facial light touch sensation equal VIII: hearing grossly normal to voice  X: uvula elevates symmetrically  XI: bilateral shoulder shrug symmetric and strong XII: midline tongue extension without fassiculations Motor:  Normal tone. 5/5 strength of BUE and BLE major muscle groups including strong and equal grip strength and dorsiflexion/plantar flexion Sensory: light touch normal in all extremities.     ED Results / Procedures / Treatments   Labs (all labs ordered are listed, but only abnormal results are displayed) Labs Reviewed  CBC WITH DIFFERENTIAL/PLATELET - Abnormal; Notable for the following components:      Result Value   RBC 3.54 (*)    Hemoglobin 7.7 (*)    HCT 25.9 (*)    MCV 73.2 (*)    MCH 21.8 (*)    MCHC  29.7 (*)    RDW 27.2 (*)    Platelets 450 (*)    Abs Immature Granulocytes 0.11 (*)    All other components within normal limits  BASIC METABOLIC PANEL - Abnormal; Notable for the following components:   Glucose, Bld 181 (*)    Calcium 8.2 (*)    All other components within normal limits  HCG, SERUM, QUALITATIVE  URINALYSIS, ROUTINE W REFLEX MICROSCOPIC  POC SARS CORONAVIRUS 2 AG -  ED    EKG None  Radiology DG Chest Portable 1 View  Result Date: 05/18/2019 CLINICAL DATA:  Patient with dizziness for 3 days. EXAM: PORTABLE CHEST 1 VIEW COMPARISON:  None. FINDINGS: Normal cardiac and mediastinal contours.  Low lung volumes. Bibasilar atelectasis. No pleural effusion or pneumothorax. IMPRESSION: Low lung volumes with bibasilar atelectasis. Electronically Signed   By: Lovey Newcomer M.D.   On: 05/18/2019 19:59    Procedures Procedures (including critical care time)  Medications Ordered in ED Medications  sodium chloride 0.9 % bolus 1,000 mL (has no administration in time range)  sodium chloride 0.9 % bolus 1,000 mL (0 mLs Intravenous Stopped 05/18/19 2134)  acetaminophen (TYLENOL) tablet 1,000 mg (1,000 mg Oral Given 05/18/19 2040)    ED Course  I have reviewed the triage vital signs and the nursing notes.  Pertinent labs & imaging results that were available during my care of the patient were reviewed by me and considered in my medical decision making (see chart for details).    MDM Rules/Calculators/A&P                      25 year old female presenting for evaluation of dizziness/lightheadedness, vaginal bleeding.  Also with URI symptoms and recent exposure to multiple people who are positive for Covid.  On arrival she is tachycardic, febrile.  Her blood pressure is normal and she has a normal respiratory rate and O2 sat. She does have evidence of orthostasis.  Her abdomen is soft and nontender.  Lungs are clear to auscultation.  Pelvic exam deferred.  We will get labs, chest  x-ray, give IV fluids, Tylenol and reassess.  CBC with anemia, hgb 7.7, decreased from prior 1 month ago however does seem to be consistent with patients baseline BMP w/o significant electrolyte derangement, normal kidney function Pregnancy test pending UA pending  EKG with sinus tachycardia, low voltage in the precordial leads and borderline t abnormalities diffusely   Chest x-ray reviewed/interpreted - Low lung volumes with bibasilar atelectasis.   9:49 PM rechecked patient.  She is sitting up in bed on her iPad.  She states she feels much improved after the first liter of IV fluids however her heart rate is still elevated in the 120s.  She has been able to tolerate ice chips without any episodes of vomiting.  We discussed her labs thus far and her anemia.  She states that she does not feel like she needs a blood transfusion at this time.  I am in agreement with this given her hemoglobin appears to be baseline today.  At shift change, care transition to Kem Parkinson, PA-C with plan to follow-up on pending UA, pregnancy test and reassessment after second liter of IV fluids.  If heart rate improving and patient is still feeling well, she is likely appropriate for discharge with symptomatic management with cough medication, Zofran and OB/GYN follow-up.  Final Clinical Impression(s) / ED Diagnoses Final diagnoses:  Lightheadedness  Person under investigation for COVID-19    Rx / DC Orders ED Discharge Orders         Ordered    ondansetron (ZOFRAN) 4 MG tablet  Every 6 hours     05/18/19 2152    benzonatate (TESSALON) 100 MG capsule  Every 8 hours     05/18/19 2152           Bishop Dublin 05/18/19 2217    Carmin Muskrat, MD 05/23/19 1429

## 2019-05-18 NOTE — ED Triage Notes (Signed)
Dizzy x 3 days.  Reports heavy vaginal bleeding x 8 days. Unable to state how many pads she bleeds through in an hour.

## 2019-05-18 NOTE — ED Notes (Signed)
Pt given snack per request- pt drank both cups of water

## 2019-05-18 NOTE — ED Provider Notes (Signed)
   Patient signed out to me by Marvia Pickles, PA-C at end of shift pending recheck after IV fluid hydration.  Patient here with dizziness and flulike symptoms for several days.  She was found to be orthostatic and tachycardic.  She has been given IV normal saline and she is tolerating oral fluids well.  Evaluation here hemoglobin was found to be 7.7 which appears to be her baseline.  She is Covid positive.  Patient of advised of findings, she is not hypoxic and reports feeling better and states she is ready for discharge home.  She remains tachycardic after her second liter of IV fluids, but states that she feels much better and she ambulated in the department and no longer feels dizzy.  Heart rate improved from initial.  She was also seen by Dr. Vanita Panda.  She was given instructions to quarantine at home and she agrees to this plan.  Return precautions discussed.  She will continue oral fluids at home as well.    Labs Reviewed  CBC WITH DIFFERENTIAL/PLATELET - Abnormal; Notable for the following components:      Result Value   RBC 3.54 (*)    Hemoglobin 7.7 (*)    HCT 25.9 (*)    MCV 73.2 (*)    MCH 21.8 (*)    MCHC 29.7 (*)    RDW 27.2 (*)    Platelets 450 (*)    Abs Immature Granulocytes 0.11 (*)    All other components within normal limits  BASIC METABOLIC PANEL - Abnormal; Notable for the following components:   Glucose, Bld 181 (*)    Calcium 8.2 (*)    All other components within normal limits  POC SARS CORONAVIRUS 2 AG -  ED - Abnormal; Notable for the following components:   SARS Coronavirus 2 Ag POSITIVE (*)    All other components within normal limits  HCG, SERUM, QUALITATIVE  URINALYSIS, ROUTINE W REFLEX MICROSCOPIC      Kem Parkinson, PA-C 05/19/19 0016    Carmin Muskrat, MD 05/23/19 1430

## 2019-05-18 NOTE — Discharge Instructions (Addendum)
You were given a prescription for zofran to help with your nausea. Please take as directed.  Take tessalon as directed for your cough.   Your Covid test this evening was positive.  You will need to quarantine at home for at least 10 days.  Please follow up with and OB-GYN within 5-7 days for re-evaluation of your symptoms of vaginal bleeding.  If you do not have a primary care provider, information for a healthcare clinic has been provided for you to make arrangements for follow up care. Please return to the emergency department for any new or worsening symptoms.

## 2019-05-18 NOTE — ED Notes (Signed)
Pt given 2 cups of water per request

## 2019-05-19 ENCOUNTER — Encounter: Payer: Self-pay | Admitting: Physician Assistant

## 2019-05-19 ENCOUNTER — Other Ambulatory Visit: Payer: Self-pay | Admitting: Physician Assistant

## 2019-05-19 DIAGNOSIS — U071 COVID-19: Secondary | ICD-10-CM

## 2019-05-19 MED ORDER — SODIUM CHLORIDE 0.9 % IV SOLN
Freq: Once | INTRAVENOUS | Status: AC
  Filled 2019-05-19: qty 700

## 2019-05-19 NOTE — Progress Notes (Signed)
  I connected by phone with Alyssa Becker on 05/19/2019 at 8:01 AM to discuss the potential use of an new treatment for mild to moderate COVID-19 viral infection in non-hospitalized patients.  This patient is a 24 y.o. female that meets the FDA criteria for Emergency Use Authorization of bamlanivimab/etesevimab or casirivimab/imdevimab.  Has a (+) direct SARS-CoV-2 viral test result  Has mild or moderate COVID-19   Is ? 23 years of age and weighs ? 40 kg  Is NOT hospitalized due to COVID-19  Is NOT requiring oxygen therapy or requiring an increase in baseline oxygen flow rate due to COVID-19  Is within 10 days of symptom onset  Has at least one of the high risk factor(s) for progression to severe COVID-19 and/or hospitalization as defined in EUA.  Specific high risk criteria : BMI >/= 35   I have spoken and communicated the following to the patient or parent/caregiver:  1. FDA has authorized the emergency use of bamlanivimab/etesevimab and casirivimab\imdevimab for the treatment of mild to moderate COVID-19 in adults and pediatric patients with positive results of direct SARS-CoV-2 viral testing who are 37 years of age and older weighing at least 40 kg, and who are at high risk for progressing to severe COVID-19 and/or hospitalization.  2. The significant known and potential risks and benefits of bamlanivimab/etesevimab and casirivimab\imdevimab, and the extent to which such potential risks and benefits are unknown.  3. Information on available alternative treatments and the risks and benefits of those alternatives, including clinical trials.  4. Patients treated with bamlanivimab/etesevimab and casirivimab\imdevimab should continue to self-isolate and use infection control measures (e.g., wear mask, isolate, social distance, avoid sharing personal items, clean and disinfect "high touch" surfaces, and frequent handwashing) according to CDC guidelines.   5. The patient or  parent/caregiver has the option to accept or refuse bamlanivimab/etesevimab or casirivimab\imdevimab .  After reviewing this information with the patient, The patient agreed to proceed with receiving the bamlanivimab/etesevimab infusion and will be provided a copy of the Fact sheet prior to receiving the infusion.  Sx onset 4/26. Set up for infusion 4/30 @ 11:30am. Directions given.   Angelena Form PA-C 05/19/2019 8:01 AM

## 2019-05-20 ENCOUNTER — Ambulatory Visit (HOSPITAL_COMMUNITY)
Admission: RE | Admit: 2019-05-20 | Discharge: 2019-05-20 | Disposition: A | Discharge status: Home / Self Care | Payer: Medicaid Other | Source: Ambulatory Visit | Attending: Pulmonary Disease | Admitting: Pulmonary Disease

## 2019-05-20 DIAGNOSIS — U071 COVID-19: Secondary | ICD-10-CM | POA: Diagnosis present

## 2019-05-20 MED ORDER — ALBUTEROL SULFATE HFA 108 (90 BASE) MCG/ACT IN AERS: 2.0000 | INHALATION_SPRAY | Freq: Once | RESPIRATORY_TRACT | Status: DC | PRN

## 2019-05-20 MED ORDER — EPINEPHRINE 0.3 MG/0.3ML IJ SOAJ: 0.3000 mg | Freq: Once | INTRAMUSCULAR | Status: DC | PRN

## 2019-05-20 MED ORDER — METHYLPREDNISOLONE SODIUM SUCC 125 MG IJ SOLR: 125.0000 mg | Freq: Once | INTRAMUSCULAR | Status: DC | PRN

## 2019-05-20 MED ORDER — FAMOTIDINE IN NACL 20-0.9 MG/50ML-% IV SOLN: 20.0000 mg | Freq: Once | INTRAVENOUS | Status: DC | PRN

## 2019-05-20 MED ORDER — SODIUM CHLORIDE 0.9 % IV SOLN: INTRAVENOUS | Status: DC | PRN

## 2019-05-20 MED ORDER — DIPHENHYDRAMINE HCL 50 MG/ML IJ SOLN: 50.0000 mg | Freq: Once | INTRAMUSCULAR | Status: DC | PRN

## 2019-05-20 NOTE — Discharge Instructions (Signed)
10 Things You Can Do to Manage Your COVID-19 Symptoms at Home If you have possible or confirmed COVID-19: 1. Stay home from work and school. And stay away from other public places. If you must go out, avoid using any kind of public transportation, ridesharing, or taxis. 2. Monitor your symptoms carefully. If your symptoms get worse, call your healthcare provider immediately. 3. Get rest and stay hydrated. 4. If you have a medical appointment, call the healthcare provider ahead of time and tell them that you have or may have COVID-19. 5. For medical emergencies, call 911 and notify the dispatch personnel that you have or may have COVID-19. 6. Cover your cough and sneezes with a tissue or use the inside of your elbow. 7. Wash your hands often with soap and water for at least 20 seconds or clean your hands with an alcohol-based hand sanitizer that contains at least 60% alcohol. 8. As much as possible, stay in a specific room and away from other people in your home. Also, you should use a separate bathroom, if available. If you need to be around other people in or outside of the home, wear a mask. 9. Avoid sharing personal items with other people in your household, like dishes, towels, and bedding. 10. Clean all surfaces that are touched often, like counters, tabletops, and doorknobs. Use household cleaning sprays or wipes according to the label instructions. cdc.gov/coronavirus 07/21/2018   

## 2019-05-20 NOTE — Progress Notes (Signed)
Patient ID: Alyssa Becker, female   DOB: 17-May-1995, 24 y.o.   MRN: [id number]  Diagnosis: [id number]  Procedure: [facility] Infusion [facility] Med: bamlanivimab\etesevimab infusion - Provided patient with bamlanimivab\etesevimab fact sheet for patients, parents and caregivers prior to infusion.  Complications: No immediate complications noted.  Discharge: Discharged home   Heide Scales 05/20/2019

## 2019-08-29 ENCOUNTER — Encounter (HOSPITAL_COMMUNITY): Payer: Self-pay | Admitting: *Deleted

## 2019-08-29 ENCOUNTER — Emergency Department (HOSPITAL_COMMUNITY)
Admission: EM | Admit: 2019-08-29 | Discharge: 2019-08-29 | Disposition: A | Discharge status: Home / Self Care | Payer: Medicaid Other | Attending: Emergency Medicine | Admitting: Emergency Medicine

## 2019-08-29 ENCOUNTER — Other Ambulatory Visit: Payer: Self-pay

## 2019-08-29 DIAGNOSIS — Z5321 Procedure and treatment not carried out due to patient leaving prior to being seen by health care provider: Secondary | ICD-10-CM | POA: Diagnosis not present

## 2019-08-29 DIAGNOSIS — R935 Abnormal findings on diagnostic imaging of other abdominal regions, including retroperitoneum: Secondary | ICD-10-CM | POA: Insufficient documentation

## 2019-08-29 NOTE — ED Triage Notes (Signed)
Pt states she did 2 pregnancy tests at home that turned out positive and states she wants to confirm she is pregnant; pt states she has not had a menstrual cycle since April 2021

## 2019-10-24 ENCOUNTER — Ambulatory Visit
Admission: EM | Admit: 2019-10-24 | Discharge: 2019-10-24 | Disposition: A | Discharge status: Home / Self Care | Payer: Medicaid Other | Attending: Emergency Medicine | Admitting: Emergency Medicine

## 2019-10-24 ENCOUNTER — Other Ambulatory Visit: Payer: Self-pay

## 2019-10-24 DIAGNOSIS — M25562 Pain in left knee: Secondary | ICD-10-CM

## 2019-10-24 DIAGNOSIS — J029 Acute pharyngitis, unspecified: Secondary | ICD-10-CM

## 2019-10-24 LAB — POCT RAPID STREP A (OFFICE): Rapid Strep A Screen: NEGATIVE

## 2019-10-24 MED ORDER — LIDOCAINE VISCOUS HCL 2 % MT SOLN: 15.0000 mL | OROMUCOSAL | 0 refills | Status: DC | PRN

## 2019-10-24 MED ORDER — IBUPROFEN 800 MG PO TABS: 800.0000 mg | ORAL_TABLET | Freq: Three times a day (TID) | ORAL | 0 refills | Status: DC

## 2019-10-24 NOTE — ED Provider Notes (Signed)
[place]   [id number] 10/24/19 Arrival Time: 0845  WN:IOEV THROAT  SUBJECTIVE: History from: patient.  Alyssa Becker is a 24 y.o. female who presents with abrupt onset of sore throat for the past 1 days.  Denies sick exposure to strep, flu or mono, or precipitating event.  Has tried OTC medication without relief.  Symptoms are made worse with swallowing, but tolerating liquids and own secretions without difficulty.  Denies previous symptoms in the past.   Denies chills, fever, nausea, vomiting, diarrhea.  She is also complaining of left knee pain.  Reports she fell on concrete.  Report pain of 1 on a scale of 1-10.  Has tried OTC Tylenol with relief.  Denies similar symptoms in the past.  Denies alleviating or aggravating factors.  Denies chills, fever, nausea, vomiting, diarrhea  ROS: As per HPI.  All other pertinent ROS negative.      Past Medical History:  Diagnosis Date   Anemia    Asthma    Morbid obesity ([place])    History reviewed. No pertinent surgical history. Allergies  Allergen Reactions   Pineapple Anaphylaxis and Swelling    Throat swells   No current facility-administered medications on file prior to encounter.   Current Outpatient Medications on File Prior to Encounter  Medication Sig Dispense Refill   albuterol (VENTOLIN HFA) 108 (90 Base) MCG/ACT inhaler Inhale 1-2 puffs into the lungs every 6 (six) hours as needed for wheezing or shortness of breath.     ferrous sulfate 325 (65 FE) MG tablet Take 325 mg by mouth daily.      ondansetron (ZOFRAN) 4 MG tablet Take 1 tablet (4 mg total) by mouth every 6 (six) hours. 12 tablet 0   Social History   Socioeconomic History   Marital status: Single    Spouse name: Not on file   Number of children: Not on file   Years of education: Not on file   Highest education level: Not on file  Occupational History   Not on file  Tobacco Use   Smoking status: Never Smoker   Smokeless tobacco:  Never Used  Vaping Use   Vaping Use: Never used  Substance and Sexual Activity   Alcohol use: Never   Drug use: Never   Sexual activity: Not Currently  Other Topics Concern   Not on file  Social History Narrative   Not on file   Social Determinants of Health   Financial Resource Strain:    Difficulty of Paying Living Expenses: Not on file  Food Insecurity:    Worried About [place] in the Last Year: Not on file   Ran Out of Food in the Last Year: Not on file  Transportation Needs:    Lack of Transportation (Medical): Not on file   Lack of Transportation (Non-Medical): Not on file  Physical Activity:    Days of Exercise per Week: Not on file   Minutes of Exercise per Session: Not on file  Stress:    Feeling of Stress : Not on file  Social Connections:    Frequency of Communication with Friends and Family: Not on file   Frequency of Social Gatherings with Friends and Family: Not on file   Attends Religious Services: Not on file   Active Member of Clubs or Organizations: Not on file   Attends Archivist Meetings: Not on file   Marital Status: Not on file  Intimate Partner Violence:    Fear of Current or  Ex-Partner: Not on file   Emotionally Abused: Not on file   Physically Abused: Not on file   Sexually Abused: Not on file   Family History  Problem Relation Age of Onset   Asthma Mother    Diabetes Mother    Hypertension Mother    Asthma Father    Diabetes Father    Kidney disease Father    Diabetes Sister    Asthma Sister    Asthma Brother    Diabetes Brother    Diabetes Maternal Uncle    Diabetes Maternal Grandmother    Hypertension Maternal Grandmother    Anemia Maternal Grandmother    Heart attack Paternal Grandfather     OBJECTIVE:  Vitals:   10/24/19 0904  BP: 131/81  Pulse: (!) 102  Resp: 20  Temp: 98.6 F (37 C)  SpO2: 96%    Physical Exam Vitals and nursing note reviewed.   Constitutional:      General: She is not in acute distress.    Appearance: Normal appearance. She is normal weight. She is not ill-appearing, toxic-appearing or diaphoretic.  HENT:     Head: Normocephalic.     Right Ear: Tympanic membrane, ear canal and external ear normal. There is no impacted cerumen.     Left Ear: Tympanic membrane, ear canal and external ear normal. There is no impacted cerumen.     Nose: Nose normal. No congestion or rhinorrhea.     Mouth/Throat:     Mouth: Mucous membranes are moist. No oral lesions.     Pharynx: Uvula midline. No pharyngeal swelling, oropharyngeal exudate or posterior oropharyngeal erythema.     Comments: Tonsillectomy Cardiovascular:     Rate and Rhythm: Normal rate and regular rhythm.     Pulses: Normal pulses.     Heart sounds: Normal heart sounds. No murmur heard.  No friction rub. No gallop.   Pulmonary:     Effort: Pulmonary effort is normal. No respiratory distress.     Breath sounds: Normal breath sounds. No stridor. No wheezing, rhonchi or rales.  Chest:     Chest wall: No tenderness.  Neurological:     Mental Status: She is alert and oriented to person, place, and time.     LABS: Results for orders placed or performed during the hospital encounter of 10/24/19 (from the past 24 hour(s))  POCT rapid strep A     Status: None   Collection Time: 10/24/19  9:38 AM  Result Value Ref Range   Rapid Strep A Screen Negative Negative     ASSESSMENT & PLAN:  1. Sore throat   2. Acute pain of left knee     Meds ordered this encounter  Medications   lidocaine (XYLOCAINE) 2 % solution    Sig: Use as directed 15 mLs in the mouth or throat as needed for mouth pain.    Dispense:  100 mL    Refill:  0     Strep test negative, will send out for culture and we will call you with results Declines test for mono at this time Get plenty of rest and push fluids Viscous lidocaine prescribed.  This is an oral solution you can swish, and  gargle as needed for symptomatic relief of sore throat.  Do not exceed 8 doses in a 24 hour period.  Do not use prior to eating, as this will numb your entire mouth.   Drink warm or cool liquids, use throat lozenges, or popsicles to help alleviate symptoms  Take OTC ibuprofen or tylenol as needed for pain Follow up with PCP if symptoms persists Return or go to ER if patient has any new or worsening symptoms such as fever, chills, nausea, vomiting, worsening sore throat, cough, abdominal pain, chest pain, changes in bowel or bladder habits, etc...  Follow RICE instruction that is attached Follow PCP/orthopedic if symptoms persist Return or go to ED for worsening of symptoms  Reviewed expectations re: course of current medical issues. Questions answered. Outlined signs and symptoms indicating need for more acute intervention. Patient verbalized understanding. After Visit Summary given.        Emerson Monte, [place] 10/24/19 [phone]

## 2019-10-24 NOTE — Discharge Instructions (Addendum)
Strep test negative, will send out for culture and we will call you with results Declines test for mono at this time Get plenty of rest and push fluids Viscous lidocaine prescribed.  This is an oral solution you can swish, and gargle as needed for symptomatic relief of sore throat.  Do not exceed 8 doses in a 24 hour period.  Do not use prior to eating, as this will numb your entire mouth.   Drink warm or cool liquids, use throat lozenges, or popsicles to help alleviate symptoms Take OTC ibuprofen or tylenol as needed for pain Follow up with PCP if symptoms persists Return or go to ER if patient has any new or worsening symptoms such as fever, chills, nausea, vomiting, worsening sore throat, cough, abdominal pain, chest pain, changes in bowel or bladder habits, etc...  Follow RICE instruction that is attached Follow PCP/orthopedic if symptoms persist Return or go to ED for worsening of symptoms

## 2019-10-24 NOTE — ED Triage Notes (Signed)
Pt presents with sore throat that began yesterday  And also had fall on concrete last week injuring left knee, describes pain in knee as 1/10, had covid in April, declines testing

## 2019-10-26 ENCOUNTER — Other Ambulatory Visit: Payer: Self-pay

## 2019-10-26 ENCOUNTER — Emergency Department (HOSPITAL_COMMUNITY)
Admission: EM | Admit: 2019-10-26 | Discharge: 2019-10-26 | Disposition: A | Discharge status: Home / Self Care | Payer: Medicaid Other | Attending: Emergency Medicine | Admitting: Emergency Medicine

## 2019-10-26 ENCOUNTER — Emergency Department (HOSPITAL_COMMUNITY): Payer: Medicaid Other

## 2019-10-26 ENCOUNTER — Encounter (HOSPITAL_COMMUNITY): Payer: Self-pay | Admitting: Emergency Medicine

## 2019-10-26 DIAGNOSIS — M25561 Pain in right knee: Secondary | ICD-10-CM | POA: Diagnosis not present

## 2019-10-26 DIAGNOSIS — W19XXXA Unspecified fall, initial encounter: Secondary | ICD-10-CM | POA: Diagnosis not present

## 2019-10-26 DIAGNOSIS — J45909 Unspecified asthma, uncomplicated: Secondary | ICD-10-CM | POA: Insufficient documentation

## 2019-10-26 MED ORDER — ACETAMINOPHEN 325 MG PO TABS
650.0000 mg | ORAL_TABLET | Freq: Once | ORAL | Status: AC
  Administered 2019-10-26: 650 mg via ORAL
  Filled 2019-10-26: qty 2

## 2019-10-26 NOTE — ED Triage Notes (Signed)
Pt c/o of right knee pain. States she heard a pop today and is unable to bear weight.

## 2019-10-26 NOTE — ED Notes (Signed)
I assumed care of this patient at this time. At the time of assuming care, bed is locked in the lowest position, side rails x2, call bell within reach. Pt is resting comfortably in bed, right leg elevated and talking on phone at this time. All questions and concerns voiced addressed by this RN at this time. Educated on call light and hourly rounding and verbalized understanding at this time.

## 2019-10-26 NOTE — Discharge Instructions (Addendum)
Please read and follow all provided instructions.  You have been seen today for knee pain. Follow up with orthopedic doctor.   Tests performed today include: An x-ray of the affected area - does NOT show any broken bones or dislocations.  Vital signs. See below for your results today.   Home care instructions: -- *PRICE in the first 24-48 hours after injury: Protect (with brace, splint, sling), if given by your provider Rest Ice- Do not apply ice pack directly to your skin, place towel or similar between your skin and ice/ice pack. Apply ice for 20 min, then remove for 40 min while awake Compression- Wear brace, elastic bandage, splint as directed by your provider Elevate affected extremity above the level of your heart when not walking around for the first 24-48 hours   Use Ibuprofen (Motrin/Advil) 600mg  every 6 hours as needed for pain (do not exceed max dose in 24 hours, 2400mg )  Follow-up instructions: Please follow-up with your primary care provider or the provided orthopedic physician (bone specialist) if you continue to have significant pain in 1 week. In this case you may have a more severe injury that requires further care.   Return instructions:  Please return if your toes or feet are numb or tingling, appear gray or blue, or you have severe pain (also elevate the leg and loosen splint or wrap if you were given one) Please return to the Emergency Department if you experience worsening symptoms.  Please return if you have any other emergent concerns. Additional Information:  Your vital signs today were: BP (!) 144/79 (BP Location: Right Arm)   Pulse 98   Temp 97.9 F (36.6 C) (Oral)   Resp 18   Ht 5\' 6"  (1.676 m)   Wt 132.5 kg   LMP 09/13/2019   SpO2 100%   BMI 47.13 kg/m  If your blood pressure (BP) was elevated above 135/85 this visit, please have this repeated by your doctor within one month. ---------------

## 2019-10-26 NOTE — ED Provider Notes (Signed)
[facility] EMERGENCY DEPARTMENT Provider Note   CSN: [id number] Arrival date & time: 10/26/19  1438     History Chief Complaint  Patient presents with  . Knee Pain    Alyssa Becker is a 24 y.o. female with past medical history of anemia, asthma, morbid obesity that presents the emergency department today for right knee pain.  Patient states that she fell on her knee on Monday, fell directly on her knee, since then has been having some pain on her knee.  States that she was speed walking today and heard a popping sensation on that knee.  No twisting motion.   Denies any pain radiating down into her leg.  Denies any numbness or tingling.  States that she cannot bear weight on her knee.  States that she was in normal health before this.  Denies any fevers, chills, nausea, vomiting.  Has not taken anything for this.   HPI     Past Medical History:  Diagnosis Date  . Anemia   . Asthma   . Morbid obesity [facility])     Patient Active Problem List   Diagnosis Date Noted  . Morbid obesity ([place])   . Microcytic anemia 04/07/2019    History reviewed. No pertinent surgical history.   OB History   No obstetric history on file.     Family History  Problem Relation Age of Onset  . Asthma Mother   . Diabetes Mother   . Hypertension Mother   . Asthma Father   . Diabetes Father   . Kidney disease Father   . Diabetes Sister   . Asthma Sister   . Asthma Brother   . Diabetes Brother   . Diabetes Maternal Uncle   . Diabetes Maternal Grandmother   . Hypertension Maternal Grandmother   . Anemia Maternal Grandmother   . Heart attack Paternal Grandfather     Social History   Tobacco Use  . Smoking status: Never Smoker  . Smokeless tobacco: Never Used  Vaping Use  . Vaping Use: Never used  Substance Use Topics  . Alcohol use: Never  . Drug use: Never    Home Medications Prior to Admission medications   Medication Sig Start Date End Date Taking? Authorizing Provider   albuterol (VENTOLIN HFA) 108 (90 Base) MCG/ACT inhaler Inhale 1-2 puffs into the lungs every 6 (six) hours as needed for wheezing or shortness of breath.    [provider]  ferrous sulfate 325 (65 FE) MG tablet Take 325 mg by mouth daily.     [provider]  ibuprofen (ADVIL) 800 MG tablet Take 1 tablet (800 mg total) by mouth 3 (three) times daily. 10/24/19   Avegno, Darrelyn Hillock, FNP  lidocaine (XYLOCAINE) 2 % solution Use as directed 15 mLs in the mouth or throat as needed for mouth pain. 10/24/19   Avegno, Darrelyn Hillock, FNP  ondansetron (ZOFRAN) 4 MG tablet Take 1 tablet (4 mg total) by mouth every 6 (six) hours. 05/18/19   Couture, Cortni S, PA-C    Allergies    Pineapple  Review of Systems   Review of Systems  Constitutional: Negative for diaphoresis, fatigue and fever.  Eyes: Negative for visual disturbance.  Respiratory: Negative for shortness of breath.   Cardiovascular: Negative for chest pain.  Gastrointestinal: Negative for nausea and vomiting.  Musculoskeletal: Positive for arthralgias. Negative for back pain and myalgias.  Skin: Negative for color change, pallor, rash and wound.  Neurological: Negative for syncope, weakness, light-headedness, numbness and  headaches.  Psychiatric/Behavioral: Negative for behavioral problems and confusion.    Physical Exam Updated Vital Signs BP (!) 144/79 (BP Location: Right Arm)   Pulse 98   Temp 97.9 F (36.6 C) (Oral)   Resp 18   Ht 5\' 6"  (1.676 m)   Wt 132.5 kg   LMP 09/13/2019   SpO2 100%   BMI 47.13 kg/m   Physical Exam Constitutional:      General: She is not in acute distress.    Appearance: Normal appearance. She is not ill-appearing, toxic-appearing or diaphoretic.  Cardiovascular:     Rate and Rhythm: Normal rate and regular rhythm.     Pulses: Normal pulses.  Pulmonary:     Effort: Pulmonary effort is normal.     Breath sounds: Normal breath sounds.  Musculoskeletal:     Left knee: No swelling,  deformity, effusion, erythema, ecchymosis, lacerations, bony tenderness or crepitus. Decreased range of motion. Tenderness present.       Legs:     Comments: Knee with  tenderness inferior to patella, as depicted in the picture.  No erythema or warmth noted.  No effusion.  Patient able to range knee slowly in all directions, slightly limited due to pain.  Unable to perform laxity test due to habitus.  Was able to see patient move knee and get out of bed to bear weight on knee.  Patient did have difficulty with this but was able to do it.  Patient with normal range of motion to ankle and toes.  PT and DP pulses 2+ and equal, normal cap refill.  Normal sensation throughout.  Skin:    General: Skin is warm and dry.     Capillary Refill: Capillary refill takes less than 2 seconds.  Neurological:     General: No focal deficit present.     Mental Status: She is alert and oriented to person, place, and time.  Psychiatric:        Mood and Affect: Mood normal.        Behavior: Behavior normal.        Thought Content: Thought content normal.     ED Results / Procedures / Treatments   Labs (all labs ordered are listed, but only abnormal results are displayed) Labs Reviewed - No data to display  EKG None  Radiology DG Knee Complete 4 Views Right  Result Date: 10/26/2019 CLINICAL DATA:  Recent fall with right-sided knee pain, initial encounter EXAM: RIGHT KNEE - COMPLETE 4+ VIEW COMPARISON:  None. FINDINGS: No acute fracture or dislocation is noted. Mild soft tissue swelling is noted anteriorly. IMPRESSION: No acute fracture noted. Electronically Signed   By: Inez Catalina M.D.   On: 10/26/2019 15:53    Procedures Procedures (including critical care time)  Medications Ordered in ED Medications  acetaminophen (TYLENOL) tablet 650 mg (has no administration in time range)    ED Course  I have reviewed the triage vital signs and the nursing notes.  Pertinent labs & imaging results that were  available during my care of the patient were reviewed by me and considered in my medical decision making (see chart for details).    MDM Rules/Calculators/A&P                         Alyssa Becker is a 24 y.o. female with past medical history of anemia, asthma, morbid obesity that presents the emergency department today for right knee pain.  Patient with tenderness inferior to  patella, unable to fully perform laxity test due to habitus, however patient did not twist the knee so unlikely for ligamental tear.  Patient could have dislocated and relocated knee, patient is distally neurovascularly intact with good PT and DP pulses therefore not worried about popliteal injury.  Patient with normal sensation and strength throughout leg, distally neurovascularly intact.  Knee brace, crutches, Ortho referral given and symptomatic treatment discussed. Upon reassement, pt able to walk on knee with brace and crutches, pt to be discharged.   Doubt need for further emergent work up at this time. I explained the diagnosis and have given explicit precautions to return to the ER including for any other new or worsening symptoms. The patient understands and accepts the medical plan as it's been dictated and I have answered their questions. Discharge instructions concerning home care and prescriptions have been given. The patient is STABLE and is discharged to home in good condition. Final Clinical Impression(s) / ED Diagnoses Final diagnoses:  Acute pain of right knee    Rx / DC Orders ED Discharge Orders    None       Alfredia Client, PA-C 10/26/19 1742    Fredia Sorrow, MD 10/28/19 1536

## 2019-10-26 NOTE — ED Notes (Signed)
X Ray at bedside at this time.  

## 2019-10-27 LAB — CULTURE, GROUP A STREP (THRC)

## 2020-02-16 ENCOUNTER — Encounter (HOSPITAL_COMMUNITY): Payer: Self-pay | Admitting: Emergency Medicine

## 2020-02-16 ENCOUNTER — Emergency Department (HOSPITAL_COMMUNITY)
Admission: EM | Admit: 2020-02-16 | Discharge: 2020-02-16 | Disposition: A | Discharge status: Home / Self Care | Payer: Medicaid Other | Attending: Emergency Medicine | Admitting: Emergency Medicine

## 2020-02-16 ENCOUNTER — Other Ambulatory Visit: Payer: Self-pay

## 2020-02-16 DIAGNOSIS — Z5321 Procedure and treatment not carried out due to patient leaving prior to being seen by health care provider: Secondary | ICD-10-CM | POA: Insufficient documentation

## 2020-02-16 DIAGNOSIS — R1030 Lower abdominal pain, unspecified: Secondary | ICD-10-CM | POA: Insufficient documentation

## 2020-02-16 LAB — CBC
HCT: 38.9 % (ref 36.0–46.0)
Hemoglobin: 10.5 g/dL — ABNORMAL LOW (ref 12.0–15.0)
MCH: 18.1 pg — ABNORMAL LOW (ref 26.0–34.0)
MCHC: 27 g/dL — ABNORMAL LOW (ref 30.0–36.0)
MCV: 67 fL — ABNORMAL LOW (ref 80.0–100.0)
Platelets: 393 10*3/uL (ref 150–400)
RBC: 5.81 MIL/uL — ABNORMAL HIGH (ref 3.87–5.11)
RDW: 25.9 % — ABNORMAL HIGH (ref 11.5–15.5)
WBC: 11.8 10*3/uL — ABNORMAL HIGH (ref 4.0–10.5)
nRBC: 0 % (ref 0.0–0.2)

## 2020-02-16 LAB — COMPREHENSIVE METABOLIC PANEL
ALT: 19 U/L (ref 0–44)
AST: 21 U/L (ref 15–41)
Albumin: 4.1 g/dL (ref 3.5–5.0)
Alkaline Phosphatase: 99 U/L (ref 38–126)
Anion gap: 5 (ref 5–15)
BUN: 12 mg/dL (ref 6–20)
CO2: 27 mmol/L (ref 22–32)
Calcium: 9.2 mg/dL (ref 8.9–10.3)
Chloride: 103 mmol/L (ref 98–111)
Creatinine, Ser: 0.62 mg/dL (ref 0.44–1.00)
GFR, Estimated: >60 mL/min (ref >60)
Glucose, Bld: 109 mg/dL — ABNORMAL HIGH (ref 70–99)
Potassium: 3.7 mmol/L (ref 3.5–5.1)
Sodium: 135 mmol/L (ref 135–145)
Total Bilirubin: 0.3 mg/dL (ref 0.3–1.2)
Total Protein: 7.9 g/dL (ref 6.5–8.1)

## 2020-02-16 LAB — LIPASE, BLOOD: Lipase: 33 U/L (ref 11–51)

## 2020-02-16 NOTE — ED Triage Notes (Signed)
Pt c/o lower abd pain x 2.5 weeks that is intermittent. Pt denies any n/v/d or urinary problems.

## 2020-02-21 DIAGNOSIS — Z8616 Personal history of COVID-19: Secondary | ICD-10-CM

## 2020-02-21 HISTORY — DX: Personal history of COVID-19: Z86.16

## 2020-07-02 IMAGING — DX DG CHEST 1V PORT
1 series · 1 of 1 positions shown · non-contrast
Comparison: None.

CLINICAL DATA: Patient with dizziness for 3 days.

EXAM:
PORTABLE CHEST 1 VIEW

[chest ap grid]
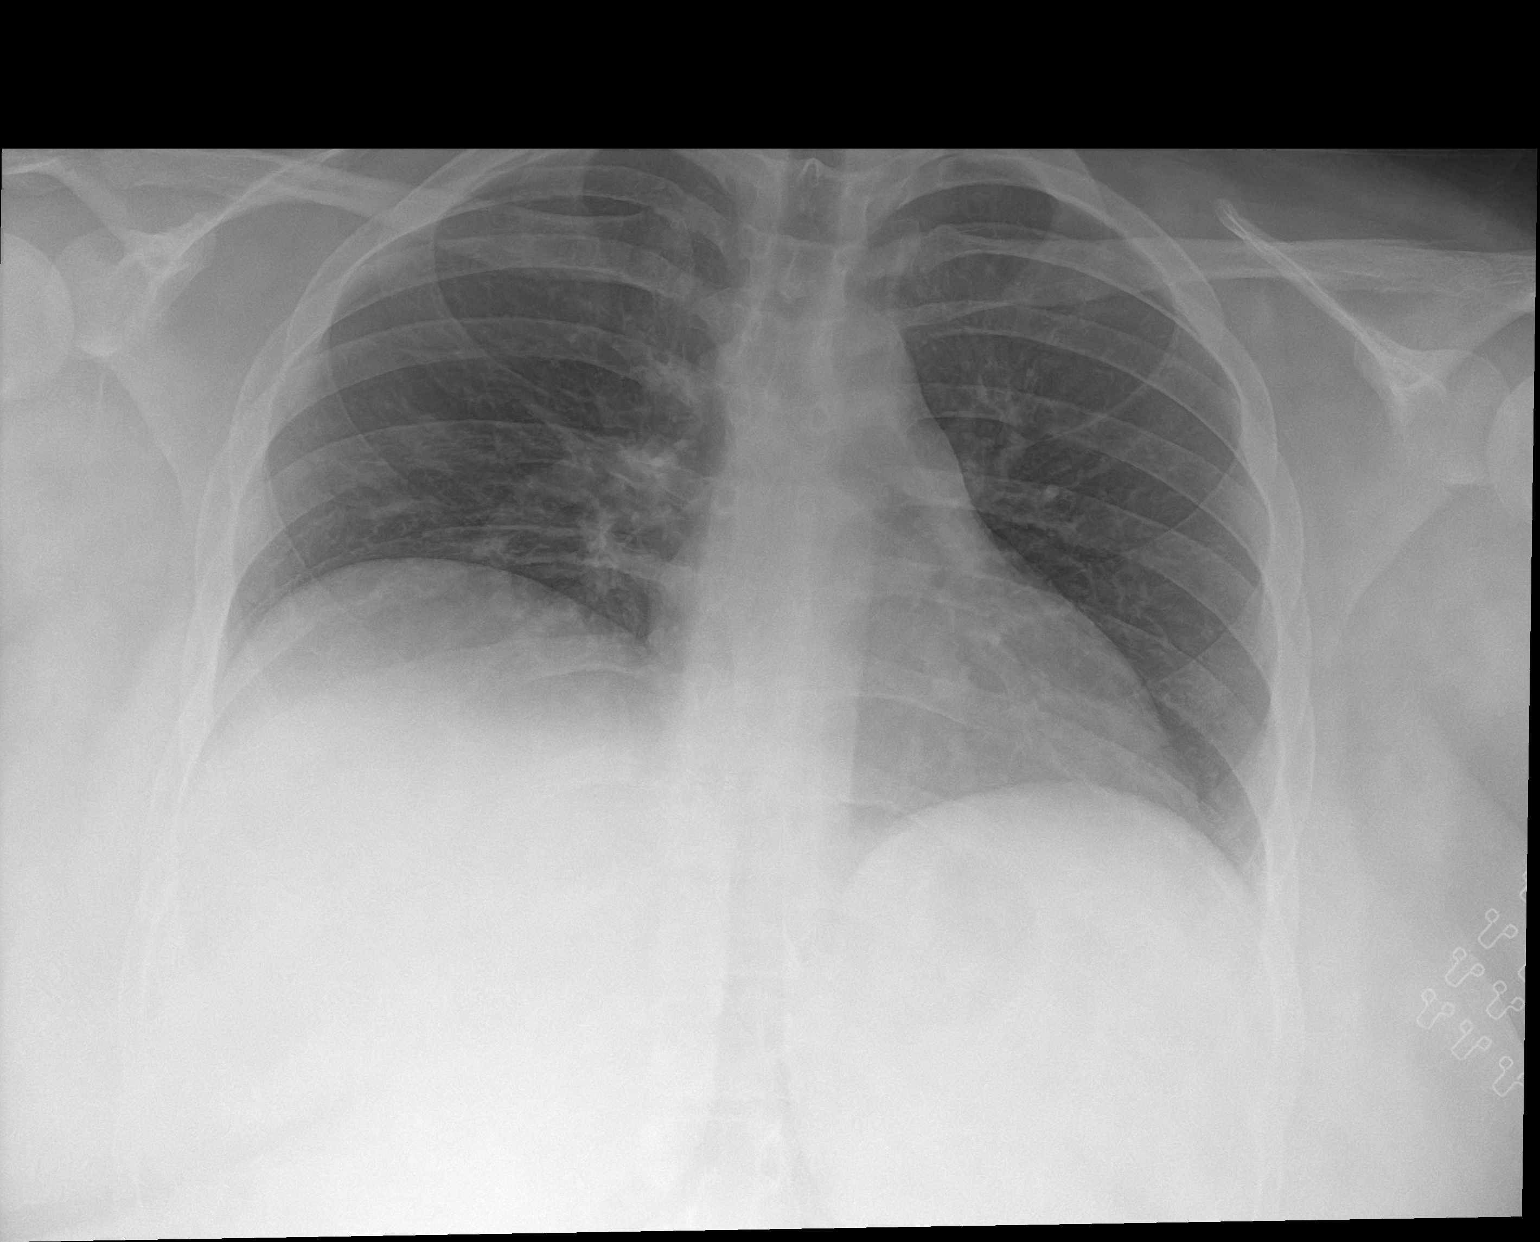

[1 of 1 positions shown; findings below may reference images not displayed]

FINDINGS: Normal cardiac and mediastinal contours. Low lung volumes. Bibasilar
atelectasis. No pleural effusion or pneumothorax.
IMPRESSION: Low lung volumes with bibasilar atelectasis.

## 2020-10-01 ENCOUNTER — Emergency Department (HOSPITAL_COMMUNITY): Payer: Medicaid Other

## 2020-10-01 ENCOUNTER — Ambulatory Visit
Admission: EM | Admit: 2020-10-01 | Discharge: 2020-10-01 | Disposition: A | Discharge status: Home / Self Care | Payer: Medicaid Other

## 2020-10-01 ENCOUNTER — Encounter (HOSPITAL_COMMUNITY): Payer: Self-pay | Admitting: Emergency Medicine

## 2020-10-01 ENCOUNTER — Inpatient Hospital Stay (HOSPITAL_COMMUNITY)
Admission: EM | Admit: 2020-10-01 | Discharge: 2020-10-03 | DRG: 759 | Disposition: A | Discharge status: Home / Self Care | Payer: Medicaid Other | Attending: Obstetrics & Gynecology | Admitting: Obstetrics & Gynecology

## 2020-10-01 ENCOUNTER — Other Ambulatory Visit: Payer: Self-pay

## 2020-10-01 ENCOUNTER — Encounter: Payer: Self-pay | Admitting: Emergency Medicine

## 2020-10-01 DIAGNOSIS — E119 Type 2 diabetes mellitus without complications: Secondary | ICD-10-CM | POA: Diagnosis present

## 2020-10-01 DIAGNOSIS — Z8249 Family history of ischemic heart disease and other diseases of the circulatory system: Secondary | ICD-10-CM | POA: Diagnosis not present

## 2020-10-01 DIAGNOSIS — N7093 Salpingitis and oophoritis, unspecified: Principal | ICD-10-CM | POA: Diagnosis present

## 2020-10-01 DIAGNOSIS — Z833 Family history of diabetes mellitus: Secondary | ICD-10-CM

## 2020-10-01 DIAGNOSIS — Z91018 Allergy to other foods: Secondary | ICD-10-CM | POA: Diagnosis not present

## 2020-10-01 DIAGNOSIS — Z20822 Contact with and (suspected) exposure to covid-19: Secondary | ICD-10-CM | POA: Diagnosis present

## 2020-10-01 DIAGNOSIS — J45909 Unspecified asthma, uncomplicated: Secondary | ICD-10-CM | POA: Diagnosis present

## 2020-10-01 DIAGNOSIS — Z841 Family history of disorders of kidney and ureter: Secondary | ICD-10-CM

## 2020-10-01 DIAGNOSIS — R1011 Right upper quadrant pain: Secondary | ICD-10-CM | POA: Diagnosis present

## 2020-10-01 DIAGNOSIS — Z825 Family history of asthma and other chronic lower respiratory diseases: Secondary | ICD-10-CM

## 2020-10-01 DIAGNOSIS — Z79899 Other long term (current) drug therapy: Secondary | ICD-10-CM

## 2020-10-01 DIAGNOSIS — N838 Other noninflammatory disorders of ovary, fallopian tube and broad ligament: Secondary | ICD-10-CM

## 2020-10-01 HISTORY — DX: Type 2 diabetes mellitus without complications: E11.9

## 2020-10-01 LAB — RESP PANEL BY RT-PCR (FLU A&B, COVID) ARPGX2
Influenza A by PCR: NEGATIVE
Influenza B by PCR: NEGATIVE
SARS Coronavirus 2 by RT PCR: NEGATIVE

## 2020-10-01 LAB — COMPREHENSIVE METABOLIC PANEL
ALT: 11 U/L (ref 0–44)
AST: 16 U/L (ref 15–41)
Albumin: 4.1 g/dL (ref 3.5–5.0)
Alkaline Phosphatase: 129 U/L — ABNORMAL HIGH (ref 38–126)
Anion gap: 8 (ref 5–15)
BUN: 13 mg/dL (ref 6–20)
CO2: 26 mmol/L (ref 22–32)
Calcium: 9 mg/dL (ref 8.9–10.3)
Chloride: 101 mmol/L (ref 98–111)
Creatinine, Ser: 0.59 mg/dL (ref 0.44–1.00)
GFR, Estimated: >60 mL/min (ref >60)
Glucose, Bld: 132 mg/dL — ABNORMAL HIGH (ref 70–99)
Potassium: 3.9 mmol/L (ref 3.5–5.1)
Sodium: 135 mmol/L (ref 135–145)
Total Bilirubin: 0.4 mg/dL (ref 0.3–1.2)
Total Protein: 7.8 g/dL (ref 6.5–8.1)

## 2020-10-01 LAB — CBC WITH DIFFERENTIAL/PLATELET
Abs Immature Granulocytes: 0.11 10*3/uL — ABNORMAL HIGH (ref 0.00–0.07)
Basophils Absolute: 0.1 10*3/uL (ref 0.0–0.1)
Basophils Relative: 0 %
Eosinophils Absolute: 0.2 10*3/uL (ref 0.0–0.5)
Eosinophils Relative: 1 %
HCT: 37 % (ref 36.0–46.0)
Hemoglobin: 10.2 g/dL — ABNORMAL LOW (ref 12.0–15.0)
Immature Granulocytes: 1 %
Lymphocytes Relative: 22 %
Lymphs Abs: 3.8 10*3/uL (ref 0.7–4.0)
MCH: 18.3 pg — ABNORMAL LOW (ref 26.0–34.0)
MCHC: 27.6 g/dL — ABNORMAL LOW (ref 30.0–36.0)
MCV: 66.5 fL — ABNORMAL LOW (ref 80.0–100.0)
Monocytes Absolute: 1 10*3/uL (ref 0.1–1.0)
Monocytes Relative: 6 %
Neutro Abs: 12.1 10*3/uL — ABNORMAL HIGH (ref 1.7–7.7)
Neutrophils Relative %: 70 %
Platelets: 427 10*3/uL — ABNORMAL HIGH (ref 150–400)
RBC: 5.56 MIL/uL — ABNORMAL HIGH (ref 3.87–5.11)
RDW: 17.7 % — ABNORMAL HIGH (ref 11.5–15.5)
WBC: 17.4 10*3/uL — ABNORMAL HIGH (ref 4.0–10.5)
nRBC: 0 % (ref 0.0–0.2)

## 2020-10-01 LAB — LIPASE, BLOOD: Lipase: 32 U/L (ref 11–51)

## 2020-10-01 LAB — WET PREP, GENITAL
Clue Cells Wet Prep HPF POC: NONE SEEN
Sperm: NONE SEEN
Trich, Wet Prep: NONE SEEN
Yeast Wet Prep HPF POC: NONE SEEN

## 2020-10-01 LAB — HCG, QUANTITATIVE, PREGNANCY: hCG, Beta Chain, Quant, S: 1 m[IU]/mL (ref <5)

## 2020-10-01 LAB — CBG MONITORING, ED: Glucose-Capillary: 95 mg/dL (ref 70–99)

## 2020-10-01 MED ORDER — FENTANYL CITRATE PF 50 MCG/ML IJ SOSY: 25.0000 ug | PREFILLED_SYRINGE | INTRAMUSCULAR | Status: DC | PRN

## 2020-10-01 MED ORDER — BISACODYL 10 MG RE SUPP: 10.0000 mg | Freq: Every day | RECTAL | Status: DC | PRN

## 2020-10-01 MED ORDER — IBUPROFEN 800 MG PO TABS
800.0000 mg | ORAL_TABLET | Freq: Three times a day (TID) | ORAL | Status: DC
  Administered 2020-10-01 – 2020-10-03 (×5): 800 mg via ORAL
  Filled 2020-10-01 (×5): qty 1

## 2020-10-01 MED ORDER — SODIUM CHLORIDE 0.9 % IV SOLN
100.0000 mg | Freq: Two times a day (BID) | INTRAVENOUS | Status: DC
  Administered 2020-10-01 – 2020-10-03 (×4): 100 mg via INTRAVENOUS
  Filled 2020-10-01 (×5): qty 100

## 2020-10-01 MED ORDER — SENNOSIDES-DOCUSATE SODIUM 8.6-50 MG PO TABS: 1.0000 | ORAL_TABLET | Freq: Every evening | ORAL | Status: DC | PRN

## 2020-10-01 MED ORDER — IOHEXOL 350 MG/ML SOLN
75.0000 mL | Freq: Once | INTRAVENOUS | Status: AC | PRN
  Administered 2020-10-01: 75 mL via INTRAVENOUS

## 2020-10-01 MED ORDER — SODIUM CHLORIDE 0.9 % IV SOLN: 2.0000 g | Freq: Once | INTRAVENOUS | Status: DC

## 2020-10-01 MED ORDER — PRENATAL MULTIVITAMIN CH
1.0000 | ORAL_TABLET | Freq: Every day | ORAL | Status: DC
  Administered 2020-10-02: 1 via ORAL
  Filled 2020-10-01 (×2): qty 1

## 2020-10-01 MED ORDER — SODIUM CHLORIDE 0.9 % IV SOLN
2.0000 g | Freq: Two times a day (BID) | INTRAVENOUS | Status: DC
  Administered 2020-10-02 – 2020-10-03 (×3): 2 g via INTRAVENOUS
  Filled 2020-10-01 (×4): qty 2

## 2020-10-01 MED ORDER — OXYCODONE-ACETAMINOPHEN 5-325 MG PO TABS: 1.0000 | ORAL_TABLET | ORAL | Status: DC | PRN

## 2020-10-01 MED ORDER — METFORMIN HCL 500 MG PO TABS
500.0000 mg | ORAL_TABLET | Freq: Two times a day (BID) | ORAL | Status: DC
  Administered 2020-10-03: 500 mg via ORAL
  Filled 2020-10-01: qty 1

## 2020-10-01 MED ORDER — ONDANSETRON HCL 4 MG PO TABS: 4.0000 mg | ORAL_TABLET | Freq: Four times a day (QID) | ORAL | Status: DC

## 2020-10-01 MED ORDER — KCL IN DEXTROSE-NACL 20-5-0.45 MEQ/L-%-% IV SOLN
INTRAVENOUS | Status: DC
  Filled 2020-10-01: qty 1000

## 2020-10-01 MED ORDER — SODIUM CHLORIDE 0.9 % IV SOLN
8.0000 mg | Freq: Four times a day (QID) | INTRAVENOUS | Status: DC | PRN
  Filled 2020-10-01: qty 4

## 2020-10-01 MED ORDER — ONDANSETRON HCL 4 MG PO TABS: 4.0000 mg | ORAL_TABLET | Freq: Four times a day (QID) | ORAL | Status: DC | PRN

## 2020-10-01 MED ORDER — SODIUM CHLORIDE 0.9 % IV SOLN
2.0000 g | Freq: Once | INTRAVENOUS | Status: AC
  Administered 2020-10-01: 2 g via INTRAVENOUS
  Filled 2020-10-01: qty 2

## 2020-10-01 MED ORDER — ALBUTEROL SULFATE (2.5 MG/3ML) 0.083% IN NEBU: 2.5000 mg | INHALATION_SOLUTION | Freq: Four times a day (QID) | RESPIRATORY_TRACT | Status: DC | PRN

## 2020-10-01 NOTE — ED Provider Notes (Signed)
[facility] EMERGENCY DEPARTMENT Provider Note   CSN: [id number] Arrival date & time: 10/01/20  [id number]     History Chief Complaint  Patient presents with   Abdominal Pain    Alyssa Becker is a 25 y.o. female.  The history is provided by the patient.  Abdominal Pain Pain location:  Generalized (Pain started in her right upper quadrant 2 days ago and has gradually worsened, now involving her entire abdomen.) Pain quality: sharp   Pain radiates to:  Does not radiate Pain severity:  Severe Onset quality:  Gradual Duration:  2 days Timing:  Constant Progression:  Worsening Chronicity:  New Context: not diet changes, not eating and not retching   Context comment:  Denies nausea, vomiting or any bowel changes.  She has had anorexia x2 days. Relieved by:  Nothing Worsened by:  Position changes (Worse with movement and when supine, better in a seated position.) Ineffective treatments:  Acetaminophen Associated symptoms: anorexia   Associated symptoms: no chest pain, no chills, no constipation, no diarrhea, no dysuria, no fever, no hematuria, no nausea, no shortness of breath, no sore throat, no vaginal discharge and no vomiting   Risk factors: no alcohol abuse, has not had multiple surgeries and not pregnant     Patient was seen at a local urgent care this morning and was sent here with concerns for possible appendicitis.     Past Medical History:  Diagnosis Date   Anemia    Asthma    Diabetes mellitus without complication ([place])    Morbid obesity ([place])     Patient Active Problem List   Diagnosis Date Noted   Diabetes mellitus without complication ([place]) [id number]   Morbid obesity ([place])    Microcytic anemia 04/07/2019    No past surgical history on file.   OB History   No obstetric history on file.     Family History  Problem Relation Age of Onset   Asthma Mother    Diabetes Mother    Hypertension Mother    Asthma Father    Diabetes Father    Kidney  disease Father    Diabetes Sister    Asthma Sister    Asthma Brother    Diabetes Brother    Diabetes Maternal Uncle    Diabetes Maternal Grandmother    Hypertension Maternal Grandmother    Anemia Maternal Grandmother    Heart attack Paternal Grandfather     Social History   Tobacco Use   Smoking status: Never   Smokeless tobacco: Never  Vaping Use   Vaping Use: Never used  Substance Use Topics   Alcohol use: Never   Drug use: Never    Home Medications Prior to Admission medications   Medication Sig Start Date End Date Taking? Authorizing Provider  albuterol (VENTOLIN HFA) 108 (90 Base) MCG/ACT inhaler Inhale 1-2 puffs into the lungs every 6 (six) hours as needed for wheezing or shortness of breath.   Yes [provider]  ferrous sulfate 325 (65 FE) MG tablet Take 325 mg by mouth daily.    Yes [provider]  ibuprofen (ADVIL) 800 MG tablet Take 1 tablet (800 mg total) by mouth 3 (three) times daily. Patient not taking: Reported on 10/01/2020 10/24/19   Emerson Monte, FNP  lidocaine (XYLOCAINE) 2 % solution Use as directed 15 mLs in the mouth or throat as needed for mouth pain. Patient not taking: Reported on 10/01/2020 10/24/19   Emerson Monte, FNP  ondansetron (ZOFRAN) 4 MG  tablet Take 1 tablet (4 mg total) by mouth every 6 (six) hours. Patient not taking: Reported on 10/01/2020 05/18/19   Couture, Cortni S, PA-C    Allergies    Pineapple  Review of Systems   Review of Systems  Constitutional:  Positive for appetite change. Negative for chills and fever.  HENT:  Negative for congestion and sore throat.   Eyes: Negative.   Respiratory:  Negative for chest tightness and shortness of breath.   Cardiovascular:  Negative for chest pain.  Gastrointestinal:  Positive for abdominal pain and anorexia. Negative for constipation, diarrhea, nausea and vomiting.  Genitourinary: Negative.  Negative for dysuria, flank pain, hematuria and vaginal discharge.   Musculoskeletal:  Negative for arthralgias, joint swelling and neck pain.  Skin: Negative.  Negative for rash and wound.  Neurological:  Negative for dizziness, weakness, light-headedness, numbness and headaches.  Psychiatric/Behavioral: Negative.    All other systems reviewed and are negative.  Physical Exam Updated Vital Signs BP (!) 127/57   Pulse 95   Temp 99.3 F (37.4 C) (Oral)   Resp 18   Ht '5\' 6"'$  (1.676 m)   Wt 108.9 kg   LMP 08/04/2020   SpO2 93%   BMI 38.74 kg/m   Physical Exam Vitals and nursing note reviewed.  Constitutional:      Appearance: She is well-developed.  HENT:     Head: Normocephalic and atraumatic.  Eyes:     Conjunctiva/sclera: Conjunctivae normal.  Cardiovascular:     Rate and Rhythm: Normal rate and regular rhythm.     Heart sounds: Normal heart sounds.  Pulmonary:     Effort: Pulmonary effort is normal.     Breath sounds: Normal breath sounds. No wheezing.  Abdominal:     General: Bowel sounds are normal.     Palpations: Abdomen is soft.     Tenderness: There is generalized abdominal tenderness. There is guarding.     Comments: Generalized abdominal pain to deep palpation, worse in right upper and right lower quadrants, without guarding, no localizing pain to McBurney's point.  Musculoskeletal:        General: Normal range of motion.     Cervical back: Normal range of motion.  Skin:    General: Skin is warm and dry.  Neurological:     General: No focal deficit present.     Mental Status: She is alert.  Psychiatric:        Mood and Affect: Mood normal.    ED Results / Procedures / Treatments   Labs (all labs ordered are listed, but only abnormal results are displayed) Labs Reviewed  WET PREP, GENITAL - Abnormal; Notable for the following components:      Result Value   WBC, Wet Prep HPF POC MODERATE (*)    All other components within normal limits  CBC WITH DIFFERENTIAL/PLATELET - Abnormal; Notable for the following components:    WBC 17.4 (*)    RBC 5.56 (*)    Hemoglobin 10.2 (*)    MCV 66.5 (*)    MCH 18.3 (*)    MCHC 27.6 (*)    RDW 17.7 (*)    Platelets 427 (*)    Neutro Abs 12.1 (*)    Abs Immature Granulocytes 0.11 (*)    All other components within normal limits  COMPREHENSIVE METABOLIC PANEL - Abnormal; Notable for the following components:   Glucose, Bld 132 (*)    Alkaline Phosphatase 129 (*)    All other components within normal  limits  HCG, QUANTITATIVE, PREGNANCY  LIPASE, BLOOD  URINALYSIS, ROUTINE W REFLEX MICROSCOPIC  CBG MONITORING, ED  GC/CHLAMYDIA PROBE AMP ([place]) NOT AT [facility]    EKG None  Radiology CT ABDOMEN PELVIS W CONTRAST  Result Date: 10/01/2020 CLINICAL DATA:  Lower abdominal pain EXAM: CT ABDOMEN AND PELVIS WITH CONTRAST TECHNIQUE: Multidetector CT imaging of the abdomen and pelvis was performed using the standard protocol following bolus administration of intravenous contrast. CONTRAST:  88m OMNIPAQUE IOHEXOL 350 MG/ML SOLN COMPARISON:  None. FINDINGS: Lower chest: Lung bases are clear. No effusions. Heart is normal size. Hepatobiliary: No focal hepatic abnormality. Gallbladder unremarkable. Pancreas: No focal abnormality or ductal dilatation. Spleen: Splenomegaly with a craniocaudal length of 16.3 cm. No focal abnormality. Adrenals/Urinary Tract: No adrenal abnormality. No focal renal abnormality. No stones or hydronephrosis. Urinary bladder is unremarkable. Stomach/Bowel: Normal appendix. Stomach, large and small bowel grossly unremarkable. Vascular/Lymphatic: No evidence of aneurysm or adenopathy. Shotty retroperitoneal lymph nodes, none pathologically enlarged. Reproductive: Prominence of the cervix. Otherwise no uterine abnormality. Abnormal mixed density mass in the right ovary measures 10.1 x 6.6 cm. This is likely mixed solid and cystic. Left ovary unremarkable. Other: No free fluid or free air. Musculoskeletal: Small amount of free fluid anterior to the right  ovary/ovarian mass. IMPRESSION: Large mixed solid and cystic mass in the right ovary measuring up to 10.1 cm. Cannot exclude ovarian cystic neoplasm. Recommend further evaluation with ultrasound or MRI. Prominent appearance of the cervix. Recommend correlation with physical exam and direct visualization to exclude cervical mass. Small amount of free fluid adjacent to the right ovary. Splenomegaly. Electronically Signed   By: KRolm BaptiseM.D.   On: 10/01/2020 11:11   UK[place]PELVIC COMPLETE WITH TRANSVAGINAL  Result Date: 10/01/2020 CLINICAL DATA:  25year old with pelvic pain for 2-3 days and ovarian mass on CT. Patient reports irregular periods. Mild leukocytosis. Negative serum pregnancy test. EXAM: TRANSABDOMINAL AND TRANSVAGINAL ULTRASOUND OF PELVIS TECHNIQUE: Both transabdominal and transvaginal ultrasound examinations of the pelvis were performed. Transabdominal technique was performed for global imaging of the pelvis including uterus, ovaries, adnexal regions, and pelvic cul-de-sac. It was necessary to proceed with endovaginal exam following the transabdominal exam to visualize the endometrium and ovaries to better advantage. The patient terminated the study prior to evaluation of the ovaries transvaginally. COMPARISON:  Abdominopelvic CT same date. FINDINGS: Uterus Measurements: 10.2 x 4.5 x 5.8 cm = volume: 138.3 mL. Right adnexal mass described above abuts the uterus, but no definite uterine fibroid or other uterine mass identified. Endometrium Thickness: 19 mm.  No focal abnormality visualized. Right ovary Complex right adnexal mass measures 9.9 x 6.5 x 5.9 cm = volume of 201.1 mL. The right ovary is not seen separate from this. This mass has both solid and cystic components. The solid components are associated with hypervascularity on color Doppler. Left ovary Measurements: 4.4 x 2.5 x 3.1 cm = volume: 17.9 mL. Normal appearance/no adnexal mass. Other findings Trace free pelvic fluid noted. IMPRESSION:  1. Large indeterminate right adnexal mass with solid hypervascular and cystic components. This appears separate from the base of the appendix on preceding CT and is likely arising from the right ovary based on relationship to the gonadal vessels on CT. Given the patient's young age, hypervascularity and leukocytosis, findings could represent an atypical tubo-ovarian abscess. However, the appearance is concerning for ovarian malignancy despite the patient's young age. Recommend prompt gynecology consultation. Pelvic MRI without and with contrast may be helpful for further evaluation. 2. The  left ovary appears normal. Mild nonspecific thickening of the endometrium, within physiologic limits. Electronically Signed   By: Richardean Sale M.D.   On: 10/01/2020 14:36    Procedures Procedures   Medications Ordered in ED Medications  doxycycline (VIBRAMYCIN) 100 mg in sodium chloride 0.9 % 250 mL IVPB (100 mg Intravenous New Bag/Given 10/01/20 1716)  cefoTEtan (CEFOTAN) 2 g in sodium chloride 0.9 % 100 mL IVPB (has no administration in time range)  iohexol (OMNIPAQUE) 350 MG/ML injection 75 mL (75 mLs Intravenous Contrast Given 10/01/20 1036)    ED Course  I have reviewed the triage vital signs and the nursing notes.  Pertinent labs & imaging results that were available during my care of the patient were reviewed by me and considered in my medical decision making (see chart for details).    MDM Rules/Calculators/A&P                           Labs and imaging reviewed and discussed with patient.  Also discussed with Dr. Elonda Husky with gynecology.  Given her elevated white blood cell count her history and CT/ultrasound suggest tubo-ovarian abscess as opposed to mass or malignancy.  He has recommended that this patient be [facility].  He also advised starting doxycycline and cefoxitin.  Discussed with our pharmacist here, we do not have that available she substituted Cefotan.   Temporary admission orders placed, will arrange transportation to [facility] for direct admission. Final Clinical Impression(s) / ED Diagnoses Final diagnoses:  Right pyosalpinx    Rx / DC Orders ED Discharge Orders     None        Landis Martins 10/01/20 Donnetta Hail, MD 10/04/20 [phone]

## 2020-10-01 NOTE — ED Triage Notes (Signed)
Pt c/o generalized abd pain that started 2-3 days ago. Pt states it radiates to her right side. Pt sent from urgent care to rule out appendicitis.

## 2020-10-01 NOTE — ED Triage Notes (Signed)
Pt presents with right lower quadrant pain , pt states pain is worse with walking, began 2 days ago

## 2020-10-01 NOTE — ED Triage Notes (Signed)
Patient is being discharged from the Urgent Care and sent to the Emergency Department via private vehicle  . Per A. Amyot NP , patient is in need of higher level of care to rule out appendicitis . Patient is aware and verbalizes understanding of plan of care.  Vitals:   10/01/20 0822  BP: 132/83  Pulse: (!) 107  Resp: 18  Temp: 98.6 F (37 C)  SpO2: 99%

## 2020-10-01 NOTE — Progress Notes (Signed)
Patient arrived to 6N03 from [facility] ED. Alert and oriented x4. Denies c/o pain. Vital signs stable. Oriented to room and remote. Admitting MD Tania Ade paged.

## 2020-10-01 NOTE — H&P (Signed)
Preoperative History and Physical  Alyssa Becker is a 25 y.o.  G0P0 with Patient's last menstrual period was 08/04/2020. admitted for a management of presumed right TOA.  Pt began having abdominal pain on Saturday 09/29/20 all along the right side of her abdomen ribs to pelvis Her pain worsened on 9/11 and localized to the right pelvis No N/V + anorxia, low grade fever at home Went to urgent care who sent her to [facility] ED for evaluation for appendicits CT reveals 9-10 cm complex oblong mass in the right adnexa, sonogram confirms Rest of pelvis is normal by scan WBC 17.4K 70% PMN, temp 99.3 Reports she has never had intercourse  PMH:    Past Medical History:  Diagnosis Date   Anemia    Asthma    Diabetes mellitus without complication ([place])    Morbid obesity ([place])     PSH:    No past surgical history on file.  POb/GynH:      OB History   No obstetric history on file.     SH:   Social History   Tobacco Use   Smoking status: Never   Smokeless tobacco: Never  Vaping Use   Vaping Use: Never used  Substance Use Topics   Alcohol use: Never   Drug use: Never    FH:    Family History  Problem Relation Age of Onset   Asthma Mother    Diabetes Mother    Hypertension Mother    Asthma Father    Diabetes Father    Kidney disease Father    Diabetes Sister    Asthma Sister    Asthma Brother    Diabetes Brother    Diabetes Maternal Uncle    Diabetes Maternal Grandmother    Hypertension Maternal Grandmother    Anemia Maternal Grandmother    Heart attack Paternal Grandfather      Allergies:  Allergies  Allergen Reactions   Pineapple Anaphylaxis and Swelling    Throat swells    Medications:       Current Facility-Administered Medications:    [START ON 10/02/2020] cefoTEtan (CEFOTAN) 2 g in sodium chloride 0.9 % 100 mL IVPB, 2 g, Intravenous, Q12H, Leana Springston, Mertie Clause, MD   doxycycline (VIBRAMYCIN) 100 mg in sodium chloride 0.9 % 250 mL IVPB, 100 mg, Intravenous, Q12H,  Azhane Eckart, Mertie Clause, MD, Stopped at 10/01/20 2002  Review of Systems:   Review of Systems  Constitutional: Negative for fever, chills, weight loss, malaise/fatigue and diaphoresis.  HENT: Negative for hearing loss, ear pain, nosebleeds, congestion, sore throat, neck pain, tinnitus and ear discharge.   Eyes: Negative for blurred vision, double vision, photophobia, pain, discharge and redness.  Respiratory: Negative for cough, hemoptysis, sputum production, shortness of breath, wheezing and stridor.   Cardiovascular: Negative for chest pain, palpitations, orthopnea, claudication, leg swelling and PND.  Gastrointestinal: Positive for abdominal pain. Negative for heartburn, nausea, vomiting, diarrhea, constipation, blood in stool and melena.  Genitourinary: Negative for dysuria, urgency, frequency, hematuria and flank pain.  Musculoskeletal: Negative for myalgias, back pain, joint pain and falls.  Skin: Negative for itching and rash.  Neurological: Negative for dizziness, tingling, tremors, sensory change, speech change, focal weakness, seizures, loss of consciousness, weakness and headaches.  Endo/Heme/Allergies: Negative for environmental allergies and polydipsia. Does not bruise/bleed easily.  Psychiatric/Behavioral: Negative for depression, suicidal ideas, hallucinations, memory loss and substance abuse. The patient is not nervous/anxious and does not have insomnia.      PHYSICAL EXAM:  Blood pressure 134/71, pulse Marland Kitchen)  110, temperature 98.3 F (36.8 C), temperature source Oral, resp. rate 18, height '5\' 6"'$  (1.676 m), weight 108.9 kg, last menstrual period 08/04/2020, SpO2 100 %.    Vitals reviewed. Constitutional: She is oriented to person, place, and time. She appears well-developed and well-nourished.  HENT:  Head: Normocephalic and atraumatic.  Right Ear: External ear normal.  Left Ear: External ear normal.  Nose: Nose normal.  Mouth/Throat: Oropharynx is clear and moist.  Eyes:  Conjunctivae and EOM are normal. Pupils are equal, round, and reactive to light. Right eye exhibits no discharge. Left eye exhibits no discharge. No scleral icterus.  Neck: Normal range of motion. Neck supple. No tracheal deviation present. No thyromegaly present.  Cardiovascular: Normal rate, regular rhythm, normal heart sounds and intact distal pulses.  Exam reveals no gallop and no friction rub.   No murmur heard. Respiratory: Effort normal and breath sounds normal. No respiratory distress. She has no wheezes. She has no rales. She exhibits no tenderness.  GI: Soft. Bowel sounds are normal. She exhibits no distension and no mass. There is tenderness. moderate no rebound Genitourinary: deferred       Musculoskeletal: Normal range of motion. She exhibits no edema and no tenderness.  Neurological: She is alert and oriented to person, place, and time. She has normal reflexes. She displays normal reflexes. No cranial nerve deficit. She exhibits normal muscle tone. Coordination normal.  Skin: Skin is warm and dry. No rash noted. No erythema. No pallor.  Psychiatric: She has a normal mood and affect. Her behavior is normal. Judgment and thought content normal.    Labs: Results for orders placed or performed during the hospital encounter of 10/01/20 (from the past 336 hour(s))  CBC with Differential/Platelet   Collection Time: 10/01/20  9:03 AM  Result Value Ref Range   WBC 17.4 (H) 4.0 - 10.5 K/uL   RBC 5.56 (H) 3.87 - 5.11 MIL/uL   Hemoglobin 10.2 (L) 12.0 - 15.0 g/dL   HCT 37.0 36.0 - 46.0 %   MCV 66.5 (L) 80.0 - 100.0 fL   MCH 18.3 (L) 26.0 - 34.0 pg   MCHC 27.6 (L) 30.0 - 36.0 g/dL   RDW 17.7 (H) 11.5 - 15.5 %   Platelets 427 (H) 150 - 400 K/uL   nRBC 0.0 0.0 - 0.2 %   Neutrophils Relative % 70 %   Neutro Abs 12.1 (H) 1.7 - 7.7 K/uL   Lymphocytes Relative 22 %   Lymphs Abs 3.8 0.7 - 4.0 K/uL   Monocytes Relative 6 %   Monocytes Absolute 1.0 0.1 - 1.0 K/uL   Eosinophils Relative 1 %    Eosinophils Absolute 0.2 0.0 - 0.5 K/uL   Basophils Relative 0 %   Basophils Absolute 0.1 0.0 - 0.1 K/uL   Immature Granulocytes 1 %   Abs Immature Granulocytes 0.11 (H) 0.00 - 0.07 K/uL  Comprehensive metabolic panel   Collection Time: 10/01/20  9:03 AM  Result Value Ref Range   Sodium 135 135 - 145 mmol/L   Potassium 3.9 3.5 - 5.1 mmol/L   Chloride 101 98 - 111 mmol/L   CO2 26 22 - 32 mmol/L   Glucose, Bld 132 (H) 70 - 99 mg/dL   BUN 13 6 - 20 mg/dL   Creatinine, Ser 0.59 0.44 - 1.00 mg/dL   Calcium 9.0 8.9 - 10.3 mg/dL   Total Protein 7.8 6.5 - 8.1 g/dL   Albumin 4.1 3.5 - 5.0 g/dL   AST 16 15 -  41 U/L   ALT 11 0 - 44 U/L   Alkaline Phosphatase 129 (H) 38 - 126 U/L   Total Bilirubin 0.4 0.3 - 1.2 mg/dL   GFR, Estimated >60 >60 mL/min   Anion gap 8 5 - 15  hCG, quantitative, pregnancy   Collection Time: 10/01/20  9:03 AM  Result Value Ref Range   hCG, Beta Chain, Quant, S 1 <5 mIU/mL  Lipase, blood   Collection Time: 10/01/20  9:03 AM  Result Value Ref Range   Lipase 32 11 - 51 U/L  Wet prep, genital   Collection Time: 10/01/20  2:20 PM  Result Value Ref Range   Yeast Wet Prep HPF POC NONE SEEN NONE SEEN   Trich, Wet Prep NONE SEEN NONE SEEN   Clue Cells Wet Prep HPF POC NONE SEEN NONE SEEN   WBC, Wet Prep HPF POC MODERATE (A) NONE SEEN   Sperm NONE SEEN   CBG monitoring, ED   Collection Time: 10/01/20  4:18 PM  Result Value Ref Range   Glucose-Capillary 95 70 - 99 mg/dL  Resp Panel by RT-PCR (Flu A&B, Covid) Nasopharyngeal Swab   Collection Time: 10/01/20  7:30 PM   Specimen: Nasopharyngeal Swab; Nasopharyngeal(NP) swabs in vial transport medium  Result Value Ref Range   SARS Coronavirus 2 by RT PCR NEGATIVE NEGATIVE   Influenza A by PCR NEGATIVE NEGATIVE   Influenza B by PCR NEGATIVE NEGATIVE    EKG: Orders placed or performed during the hospital encounter of 05/18/19   ED EKG   ED EKG   EKG 12-Lead   EKG 12-Lead    Imaging Studies: CT ABDOMEN  PELVIS W CONTRAST  Result Date: 10/01/2020 CLINICAL DATA:  Lower abdominal pain EXAM: CT ABDOMEN AND PELVIS WITH CONTRAST TECHNIQUE: Multidetector CT imaging of the abdomen and pelvis was performed using the standard protocol following bolus administration of intravenous contrast. CONTRAST:  69m OMNIPAQUE IOHEXOL 350 MG/ML SOLN COMPARISON:  None. FINDINGS: Lower chest: Lung bases are clear. No effusions. Heart is normal size. Hepatobiliary: No focal hepatic abnormality. Gallbladder unremarkable. Pancreas: No focal abnormality or ductal dilatation. Spleen: Splenomegaly with a craniocaudal length of 16.3 cm. No focal abnormality. Adrenals/Urinary Tract: No adrenal abnormality. No focal renal abnormality. No stones or hydronephrosis. Urinary bladder is unremarkable. Stomach/Bowel: Normal appendix. Stomach, large and small bowel grossly unremarkable. Vascular/Lymphatic: No evidence of aneurysm or adenopathy. Shotty retroperitoneal lymph nodes, none pathologically enlarged. Reproductive: Prominence of the cervix. Otherwise no uterine abnormality. Abnormal mixed density mass in the right ovary measures 10.1 x 6.6 cm. This is likely mixed solid and cystic. Left ovary unremarkable. Other: No free fluid or free air. Musculoskeletal: Small amount of free fluid anterior to the right ovary/ovarian mass. IMPRESSION: Large mixed solid and cystic mass in the right ovary measuring up to 10.1 cm. Cannot exclude ovarian cystic neoplasm. Recommend further evaluation with ultrasound or MRI. Prominent appearance of the cervix. Recommend correlation with physical exam and direct visualization to exclude cervical mass. Small amount of free fluid adjacent to the right ovary. Splenomegaly. Electronically Signed   By: KRolm BaptiseM.D.   On: 10/01/2020 11:11   [place]PELVIC COMPLETE WITH TRANSVAGINAL  Result Date: 10/01/2020 CLINICAL DATA:  25year old with pelvic pain for 2-3 days and ovarian mass on CT. Patient reports irregular  periods. Mild leukocytosis. Negative serum pregnancy test. EXAM: TRANSABDOMINAL AND TRANSVAGINAL ULTRASOUND OF PELVIS TECHNIQUE: Both transabdominal and transvaginal ultrasound examinations of the pelvis were performed. Transabdominal technique was performed for global imaging  of the pelvis including uterus, ovaries, adnexal regions, and pelvic cul-de-sac. It was necessary to proceed with endovaginal exam following the transabdominal exam to visualize the endometrium and ovaries to better advantage. The patient terminated the study prior to evaluation of the ovaries transvaginally. COMPARISON:  Abdominopelvic CT same date. FINDINGS: Uterus Measurements: 10.2 x 4.5 x 5.8 cm = volume: 138.3 mL. Right adnexal mass described above abuts the uterus, but no definite uterine fibroid or other uterine mass identified. Endometrium Thickness: 19 mm.  No focal abnormality visualized. Right ovary Complex right adnexal mass measures 9.9 x 6.5 x 5.9 cm = volume of 201.1 mL. The right ovary is not seen separate from this. This mass has both solid and cystic components. The solid components are associated with hypervascularity on color Doppler. Left ovary Measurements: 4.4 x 2.5 x 3.1 cm = volume: 17.9 mL. Normal appearance/no adnexal mass. Other findings Trace free pelvic fluid noted. IMPRESSION: 1. Large indeterminate right adnexal mass with solid hypervascular and cystic components. This appears separate from the base of the appendix on preceding CT and is likely arising from the right ovary based on relationship to the gonadal vessels on CT. Given the patient's young age, hypervascularity and leukocytosis, findings could represent an atypical tubo-ovarian abscess. However, the appearance is concerning for ovarian malignancy despite the patient's young age. Recommend prompt gynecology consultation. Pelvic MRI without and with contrast may be helpful for further evaluation. 2. The left ovary appears normal. Mild nonspecific  thickening of the endometrium, within physiologic limits. Electronically Signed   By: Richardean Sale M.D.   On: 10/01/2020 14:36      Assessment: Right sided TOA, presumed  Plan: >Cefotan/doxycycline therapy >check labs in am >IR consult in am for percutaneous drainage  Florian Buff 10/01/2020 10:34 PM

## 2020-10-02 DIAGNOSIS — N7093 Salpingitis and oophoritis, unspecified: Secondary | ICD-10-CM | POA: Diagnosis not present

## 2020-10-02 LAB — GLUCOSE, CAPILLARY
Glucose-Capillary: 102 mg/dL — ABNORMAL HIGH (ref 70–99)
Glucose-Capillary: 108 mg/dL — ABNORMAL HIGH (ref 70–99)
Glucose-Capillary: 110 mg/dL — ABNORMAL HIGH (ref 70–99)
Glucose-Capillary: 136 mg/dL — ABNORMAL HIGH (ref 70–99)
Glucose-Capillary: 162 mg/dL — ABNORMAL HIGH (ref 70–99)

## 2020-10-02 LAB — CBC WITH DIFFERENTIAL/PLATELET
Abs Immature Granulocytes: 0.12 10*3/uL — ABNORMAL HIGH (ref 0.00–0.07)
Basophils Absolute: 0 10*3/uL (ref 0.0–0.1)
Basophils Relative: 0 %
Eosinophils Absolute: 0.2 10*3/uL (ref 0.0–0.5)
Eosinophils Relative: 2 %
HCT: 30.3 % — ABNORMAL LOW (ref 36.0–46.0)
Hemoglobin: 8.6 g/dL — ABNORMAL LOW (ref 12.0–15.0)
Immature Granulocytes: 1 %
Lymphocytes Relative: 26 %
Lymphs Abs: 3.3 10*3/uL (ref 0.7–4.0)
MCH: 18.7 pg — ABNORMAL LOW (ref 26.0–34.0)
MCHC: 28.4 g/dL — ABNORMAL LOW (ref 30.0–36.0)
MCV: 65.7 fL — ABNORMAL LOW (ref 80.0–100.0)
Monocytes Absolute: 0.8 10*3/uL (ref 0.1–1.0)
Monocytes Relative: 6 %
Neutro Abs: 8.2 10*3/uL — ABNORMAL HIGH (ref 1.7–7.7)
Neutrophils Relative %: 65 %
Platelets: 370 10*3/uL (ref 150–400)
RBC: 4.61 MIL/uL (ref 3.87–5.11)
RDW: 17.4 % — ABNORMAL HIGH (ref 11.5–15.5)
WBC: 12.6 10*3/uL — ABNORMAL HIGH (ref 4.0–10.5)
nRBC: 0.2 % (ref 0.0–0.2)

## 2020-10-02 LAB — COMPREHENSIVE METABOLIC PANEL
ALT: 11 U/L (ref 0–44)
AST: 16 U/L (ref 15–41)
Albumin: 3.4 g/dL — ABNORMAL LOW (ref 3.5–5.0)
Alkaline Phosphatase: 110 U/L (ref 38–126)
Anion gap: 9 (ref 5–15)
BUN: 10 mg/dL (ref 6–20)
CO2: 22 mmol/L (ref 22–32)
Calcium: 8.7 mg/dL — ABNORMAL LOW (ref 8.9–10.3)
Chloride: 103 mmol/L (ref 98–111)
Creatinine, Ser: 0.68 mg/dL (ref 0.44–1.00)
GFR, Estimated: >60 mL/min (ref >60)
Glucose, Bld: 129 mg/dL — ABNORMAL HIGH (ref 70–99)
Potassium: 3.7 mmol/L (ref 3.5–5.1)
Sodium: 134 mmol/L — ABNORMAL LOW (ref 135–145)
Total Bilirubin: 0.5 mg/dL (ref 0.3–1.2)
Total Protein: 6.6 g/dL (ref 6.5–8.1)

## 2020-10-02 LAB — GC/CHLAMYDIA PROBE AMP (~~LOC~~) NOT AT ARMC
Chlamydia: NEGATIVE
Comment: NEGATIVE
Comment: NORMAL
Neisseria Gonorrhea: NEGATIVE

## 2020-10-02 LAB — PROTIME-INR
INR: 1.1 (ref 0.8–1.2)
Prothrombin Time: 14.3 seconds (ref 11.4–15.2)

## 2020-10-02 MED ORDER — SODIUM CHLORIDE 0.9 % IV SOLN
500.0000 mg | Freq: Once | INTRAVENOUS | Status: AC
  Administered 2020-10-02: 500 mg via INTRAVENOUS
  Filled 2020-10-02: qty 25

## 2020-10-02 NOTE — Plan of Care (Signed)
  Problem: Education: Goal: Knowledge of General Education information will improve Description Including pain rating scale, medication(s)/side effects and non-pharmacologic comfort measures Outcome: Progressing   

## 2020-10-02 NOTE — Progress Notes (Signed)
    Request received for ovarian cyst/abscess drain placement  IMPRESSION: Large mixed solid and cystic mass in the right ovary measuring up to 10.1 cm. Cannot exclude ovarian cystic neoplasm. Recommend further evaluation with ultrasound or MRI.   Reviewed with Dr Pascal Lux He is recommending MRI Pelvis Looks to be ovarian mass--- not abscess  No drain to be performed

## 2020-10-02 NOTE — Progress Notes (Signed)
HD # 1 Right pyosalpinx vs TOA  Subjective: Patient reports feeling much better. She denies any pain. No N/V. Ambulating and voiding without problems. Tolerating diet  Objective: AF VSS  Lungs clear Heart RRR Abd soft + BS non tender Ext non tender   Assessment/Plan: Stable. Pain has resolved. Continue with IV antibiotics x 24 hours. Unable to have IR drainage today due to question ovarian process. Unable to obtain MRI due to recent Venefor infusion.  Will continue to treat as infectious process. If continues to do well, discharge home tomorrow to complete 14 days of antibiotics with MRI pelvis w/wo contrast in 6 weeks.   LOS: 1 day    Chancy Milroy 10/02/2020, 2:54 PM

## 2020-10-03 DIAGNOSIS — N7093 Salpingitis and oophoritis, unspecified: Secondary | ICD-10-CM | POA: Diagnosis not present

## 2020-10-03 LAB — GLUCOSE, CAPILLARY: Glucose-Capillary: 114 mg/dL — ABNORMAL HIGH (ref 70–99)

## 2020-10-03 LAB — CA 125: Cancer Antigen (CA) 125: 792 U/mL — ABNORMAL HIGH (ref 0.0–38.1)

## 2020-10-03 MED ORDER — DOXYCYCLINE HYCLATE 100 MG PO TABS: 100.0000 mg | ORAL_TABLET | Freq: Two times a day (BID) | ORAL | 0 refills | Status: DC

## 2020-10-03 MED ORDER — DOXYCYCLINE HYCLATE 100 MG PO TABS: 100.0000 mg | ORAL_TABLET | Freq: Two times a day (BID) | ORAL | Status: DC

## 2020-10-03 MED ORDER — IBUPROFEN 800 MG PO TABS
800.0000 mg | ORAL_TABLET | Freq: Three times a day (TID) | ORAL | 0 refills | Status: DC
Start: 2020-10-03 — End: 2021-01-22

## 2020-10-03 MED ORDER — METFORMIN HCL 500 MG PO TABS: 500.0000 mg | ORAL_TABLET | Freq: Two times a day (BID) | ORAL | 1 refills | Status: AC

## 2020-10-03 MED ORDER — METRONIDAZOLE 500 MG PO TABS: 500.0000 mg | ORAL_TABLET | Freq: Two times a day (BID) | ORAL | 0 refills | Status: AC

## 2020-10-03 NOTE — Discharge Summary (Signed)
Physician Discharge Summary  Patient ID: Alyssa Becker MRN: [id number] DOB/AGE: 1995/12/25 25 y.o.  Admit date: 10/01/2020 Discharge date: 10/03/2020  Admission Diagnoses: Right TOA vs possible pyosalpinx  Discharge Diagnoses:  Active Problems:   Right pyosalpinx   TOA (tubo-ovarian abscess)   Discharged Condition: good  Hospital Course: Pt was admitted with above Dx. Placed on IV antibiotics with resolution of here pain. IR was consulted for possible drainage. However IR thought this was more of an ovarain process vs an infectious process. MRI of pelvis was recommended. However unable to perform due to pt having received a Venefor infusion. CA 125 was elevated but this can be seen in infection. Discussed case with GYN ONC and they agreed with plan to treat as an infectious process since pt's Sx had resolved. Obtain MRI in 6 weeks to verify resolution of adnexal mass.  Pt progressed to ambulating, voiding, tolerating diet and good oral pain control. Felt amendable for discharge home. Discharge instructions, medications, and follow up reviewed with pt. Pt verbalized understanding.  Consults:  IR  Significant Diagnostic Studies: labs and radiology: CT scan: abd/pelvis and Ultrasound: pelvis  Treatments: IV hydration and antibiotics  Discharge Exam: Blood pressure (!) 129/50, pulse 79, temperature (!) 97.5 F (36.4 C), temperature source Oral, resp. rate 14, height '5\' 6"'$  (1.676 m), weight 108.9 kg, last menstrual period 08/04/2020, SpO2 100 %.  Lungs clear Heart RRR Abd soft + BS non tender Ext non tender  Disposition: Discharge disposition: 01-Home or Self Care       Discharge Instructions     Call MD for:  difficulty breathing, headache or visual disturbances   Complete by: As directed    Call MD for:  extreme fatigue   Complete by: As directed    Call MD for:  hives   Complete by: As directed    Call MD for:  persistant dizziness or light-headedness   Complete  by: As directed    Call MD for:  persistant nausea and vomiting   Complete by: As directed    Call MD for:  redness, tenderness, or signs of infection (pain, swelling, redness, odor or green/yellow discharge around incision site)   Complete by: As directed    Call MD for:  severe uncontrolled pain   Complete by: As directed    Call MD for:  temperature >100.4   Complete by: As directed    Diet Carb Modified   Complete by: As directed    Increase activity slowly   Complete by: As directed    Sexual Activity Restrictions   Complete by: As directed    Pelvic rest x 4 weeks      Allergies as of 10/03/2020       Reactions   Pineapple Anaphylaxis, Swelling   Throat swells        Medication List     STOP taking these medications    lidocaine 2 % solution Commonly known as: XYLOCAINE   ondansetron 4 MG tablet Commonly known as: ZOFRAN       TAKE these medications    albuterol 108 (90 Base) MCG/ACT inhaler Commonly known as: VENTOLIN HFA Inhale 1-2 puffs into the lungs every 6 (six) hours as needed for wheezing or shortness of breath.   doxycycline 100 MG tablet Commonly known as: VIBRA-TABS Take 1 tablet (100 mg total) by mouth every 12 (twelve) hours.   ferrous sulfate 325 (65 FE) MG tablet Take 325 mg by mouth daily.   ibuprofen 800 MG  tablet Commonly known as: ADVIL Take 1 tablet (800 mg total) by mouth 3 (three) times daily with meals. What changed: when to take this   metFORMIN 500 MG tablet Commonly known as: GLUCOPHAGE Take 1 tablet (500 mg total) by mouth 2 (two) times daily with a meal.   metroNIDAZOLE 500 MG tablet Commonly known as: FLAGYL Take 1 tablet (500 mg total) by mouth 2 (two) times daily for 14 days.         Signed: Chancy Milroy 10/03/2020, 10:38 AM

## 2020-10-18 ENCOUNTER — Encounter: Payer: Medicaid Other | Admitting: Obstetrics & Gynecology

## 2020-11-26 ENCOUNTER — Encounter: Payer: Medicaid Other | Admitting: Obstetrics & Gynecology

## 2021-01-02 ENCOUNTER — Other Ambulatory Visit (HOSPITAL_COMMUNITY)
Admission: RE | Admit: 2021-01-02 | Discharge: 2021-01-02 | Disposition: A | Discharge status: Home / Self Care | Payer: Medicaid Other | Source: Ambulatory Visit | Attending: Obstetrics & Gynecology | Admitting: Obstetrics & Gynecology

## 2021-01-02 ENCOUNTER — Encounter: Payer: Self-pay | Admitting: Obstetrics & Gynecology

## 2021-01-02 ENCOUNTER — Ambulatory Visit: Payer: Medicaid Other | Admitting: Obstetrics & Gynecology

## 2021-01-02 ENCOUNTER — Other Ambulatory Visit: Payer: Self-pay

## 2021-01-02 VITALS — BP 136/71 | HR 99 | Ht 66.0 in | Wt 285.0 lb

## 2021-01-02 DIAGNOSIS — N888 Other specified noninflammatory disorders of cervix uteri: Secondary | ICD-10-CM | POA: Diagnosis not present

## 2021-01-02 DIAGNOSIS — N838 Other noninflammatory disorders of ovary, fallopian tube and broad ligament: Secondary | ICD-10-CM | POA: Diagnosis not present

## 2021-01-02 DIAGNOSIS — Z124 Encounter for screening for malignant neoplasm of cervix: Secondary | ICD-10-CM | POA: Insufficient documentation

## 2021-01-02 NOTE — Addendum Note (Signed)
Addended by: Annalee Genta on: 01/02/2021 04:15 PM   Modules accepted: Orders

## 2021-01-02 NOTE — Progress Notes (Addendum)
er  WELL-WOMAN EXAMINATION Patient name: Alyssa Becker MRN [id number]  Date of birth: 26-Oct-1995 Chief Complaint:   Follow-up (From ER visit; was having abdominal pain; pain is better now)  History of Present Illness:   Alyssa Becker is a 25 y.o. G0P0000  female being seen today for a routine well-woman exam.   Today she notes: that she is doing way better.  She no longer has any abdominal pain.  No nausea/vomiting tolerating general diet.  Per patient menses have been each month, but a change in the bleeding.Typically, periods will be 2days with moderate to heavy flow.  Prior to all of this, menses were about 5-7 days.  Denies intermenstrual bleeding- however after exam- further questioning- it does seem as though she has intermittent vaginal bleeding.  States it is usually spotting and may last a few days.  Denies dysmenorrhea.  Not currently sexually active.  Does not want anything for contraception, interested in a pregnancy in the future.  Records reviewed- pt seen 10/01/2020- admitted due to Right TOA. Advised f/u MRI for further evaluation of adnexal mass  Patient's last menstrual period was 12/29/2020 (approximate).   Last pap never completed.    Review of Systems:   Pertinent items are noted in HPI Denies any headaches, blurred vision, fatigue, shortness of breath, chest pain, abdominal pain, bowel movements, urination, or intercourse unless otherwise stated above.  Pertinent History Reviewed:  Reviewed past medical,surgical, social and family history.  Reviewed problem list, medications and allergies. Physical Assessment:   Vitals:   01/02/21 1421  BP: 136/71  Pulse: 99  Weight: 285 lb (129.3 kg)  Height: 5\' 6"  (1.676 m)  Body mass index is 46 kg/m.        Physical Examination:   General appearance - well appearing, and in no distress  Mental status - alert, oriented to person, place, and time  Psych:  She has a normal mood and affect  Skin - warm and  dry, normal color, no suspicious lesions noted  Chest - effort normal, all lung fields clear to auscultation bilaterally  Heart - normal rate and regular rhythm  Neck:  midline trachea, no thyromegaly or nodules, acanthosis noted  Breasts - deferred  Abdomen - obese, soft, nontender, nondistended, no masses or organomegaly  Pelvic - VULVA: normal appearing vulva with no masses, tenderness or lesions  VAGINA: normal appearing vagina with normal color and discharge, no lesions  CERVIX: friable, irregular villous appearing mass noted at cervical os- with manipulation with broom- excessive bleeding noted making further visualization difficult.  Thin prep pap is done with HR HPV co-testing.  Tissler forceps also used to obtain sample of mass.    Extremities:  No swelling or varicosities noted  Chaperone: Levy Pupa     Assessment & Plan:  1) Cervical mass -based on findings concern for malignancy -pap and cervical biopsy obtained  2) Right adnexal mass -as per prior recommendations and in light of today's finding, plan for MRI at next available  -Briefly discussed with patient and family member concern for malignancy -Plan for referral to gyn/oncology at next available -Anticipate results of pathology and MRI prior to referral   Meds: No orders of the defined types were placed in this encounter.   Follow-up: Referral to gyn oncology   Janyth Pupa, DO Attending [place], [facility] for [facility], [place]

## 2021-01-07 LAB — CYTOLOGY - PAP
Chlamydia: NEGATIVE
Comment: NEGATIVE
Comment: NORMAL
Neisseria Gonorrhea: NEGATIVE

## 2021-01-08 ENCOUNTER — Other Ambulatory Visit: Payer: Self-pay | Admitting: *Deleted

## 2021-01-08 DIAGNOSIS — C539 Malignant neoplasm of cervix uteri, unspecified: Secondary | ICD-10-CM

## 2021-01-09 ENCOUNTER — Other Ambulatory Visit (HOSPITAL_COMMUNITY): Payer: Self-pay | Admitting: Obstetrics & Gynecology

## 2021-01-09 ENCOUNTER — Other Ambulatory Visit: Payer: Self-pay | Admitting: Obstetrics & Gynecology

## 2021-01-09 ENCOUNTER — Telehealth: Payer: Self-pay | Admitting: *Deleted

## 2021-01-09 DIAGNOSIS — C539 Malignant neoplasm of cervix uteri, unspecified: Secondary | ICD-10-CM

## 2021-01-09 NOTE — Telephone Encounter (Signed)
Spoke with the patient and scheduled a new patient appt with Dr Berline Lopes on 12/28 at 12 pm. Patient given the arrival time of 11:30 am. Patient given the address and phone number for the clinic; along with the policy for mask and visitors

## 2021-01-15 ENCOUNTER — Other Ambulatory Visit: Payer: Self-pay

## 2021-01-15 ENCOUNTER — Encounter: Payer: Self-pay | Admitting: Obstetrics & Gynecology

## 2021-01-15 ENCOUNTER — Encounter: Payer: Self-pay | Admitting: Gynecologic Oncology

## 2021-01-15 ENCOUNTER — Ambulatory Visit (HOSPITAL_COMMUNITY)
Admission: RE | Admit: 2021-01-15 | Discharge: 2021-01-15 | Disposition: A | Discharge status: Home / Self Care | Payer: Medicaid Other | Source: Ambulatory Visit | Attending: Obstetrics & Gynecology | Admitting: Obstetrics & Gynecology

## 2021-01-15 DIAGNOSIS — N838 Other noninflammatory disorders of ovary, fallopian tube and broad ligament: Secondary | ICD-10-CM | POA: Insufficient documentation

## 2021-01-15 MED ORDER — GADOBUTROL 1 MMOL/ML IV SOLN
10.0000 mL | Freq: Once | INTRAVENOUS | Status: AC | PRN
  Administered 2021-01-15: 17:00:00 10 mL via INTRAVENOUS

## 2021-01-16 ENCOUNTER — Encounter: Payer: Self-pay | Admitting: Gynecologic Oncology

## 2021-01-16 ENCOUNTER — Encounter: Payer: Self-pay | Admitting: Oncology

## 2021-01-16 ENCOUNTER — Inpatient Hospital Stay: Payer: Medicaid Other | Attending: Gynecologic Oncology | Admitting: Gynecologic Oncology

## 2021-01-16 ENCOUNTER — Inpatient Hospital Stay (HOSPITAL_BASED_OUTPATIENT_CLINIC_OR_DEPARTMENT_OTHER): Payer: Medicaid Other | Admitting: Gynecologic Oncology

## 2021-01-16 VITALS — BP 146/64 | HR 95 | Temp 97.5°F | Resp 18 | Ht 66.0 in | Wt 282.4 lb

## 2021-01-16 DIAGNOSIS — Z807 Family history of other malignant neoplasms of lymphoid, hematopoietic and related tissues: Secondary | ICD-10-CM | POA: Diagnosis not present

## 2021-01-16 DIAGNOSIS — J45909 Unspecified asthma, uncomplicated: Secondary | ICD-10-CM | POA: Diagnosis not present

## 2021-01-16 DIAGNOSIS — C539 Malignant neoplasm of cervix uteri, unspecified: Secondary | ICD-10-CM

## 2021-01-16 DIAGNOSIS — D3911 Neoplasm of uncertain behavior of right ovary: Secondary | ICD-10-CM

## 2021-01-16 DIAGNOSIS — N838 Other noninflammatory disorders of ovary, fallopian tube and broad ligament: Secondary | ICD-10-CM

## 2021-01-16 DIAGNOSIS — Z7984 Long term (current) use of oral hypoglycemic drugs: Secondary | ICD-10-CM | POA: Diagnosis not present

## 2021-01-16 DIAGNOSIS — R5383 Other fatigue: Secondary | ICD-10-CM | POA: Diagnosis not present

## 2021-01-16 DIAGNOSIS — Z79899 Other long term (current) drug therapy: Secondary | ICD-10-CM | POA: Insufficient documentation

## 2021-01-16 DIAGNOSIS — Z6841 Body Mass Index (BMI) 40.0 and over, adult: Secondary | ICD-10-CM | POA: Diagnosis not present

## 2021-01-16 DIAGNOSIS — E119 Type 2 diabetes mellitus without complications: Secondary | ICD-10-CM | POA: Diagnosis not present

## 2021-01-16 DIAGNOSIS — C530 Malignant neoplasm of endocervix: Secondary | ICD-10-CM | POA: Diagnosis not present

## 2021-01-16 DIAGNOSIS — Z803 Family history of malignant neoplasm of breast: Secondary | ICD-10-CM | POA: Diagnosis not present

## 2021-01-16 DIAGNOSIS — Z809 Family history of malignant neoplasm, unspecified: Secondary | ICD-10-CM | POA: Diagnosis not present

## 2021-01-16 DIAGNOSIS — D649 Anemia, unspecified: Secondary | ICD-10-CM | POA: Diagnosis not present

## 2021-01-16 NOTE — Patient Instructions (Signed)
Plan to have your PET scan tomorrow and Dr. Berline Lopes will contact you with the results.    Preparing for your Surgery   Plan for surgery on January 5 or 10 th, 2023 (we will contact you once we have confirmed a date with the OR) with Dr. Jeral Pinch at [facility] will be scheduled for robotic assisted laparoscopic unilateral oophorectomy (removal of one ovary), bilateral salpingectomy (removal of both fallopian tubes), possible staging, oophoropexy of left ovary (moving the left ovary out of the way of radiation field), possible laparotomy (larger incision on your abdomen if needed).    Pre-operative Testing -You will receive a phone call from presurgical testing at [facility] to arrange for a pre-operative appointment and lab work.   -Bring your insurance card, copy of an advanced directive if applicable, medication list   -At that visit, you will be asked to sign a consent for a possible blood transfusion in case a transfusion becomes necessary during surgery.  The need for a blood transfusion is rare but having consent is a necessary part of your care.      -You should not be taking blood thinners or aspirin at least ten days prior to surgery unless instructed by your surgeon.   -Do not take supplements such as fish oil (omega 3), red yeast rice, turmeric before your surgery. You want to avoid medications with aspirin in them including headache powders such as BC or Goody's), Excedrin migraine.   Day Before Surgery at [place] will be asked to take in a light diet the day before surgery. You will be advised you can have clear liquids up until 3 hours before your surgery.     Eat a light diet the day before surgery.  Examples including soups, broths, toast, yogurt, mashed potatoes.  AVOID GAS PRODUCING FOODS. Things to avoid include carbonated beverages (fizzy beverages, sodas), raw fruits and raw vegetables (uncooked), or beans.    If your bowels are filled with  gas, your surgeon will have difficulty visualizing your pelvic organs which increases your surgical risks.   Your role in recovery Your role is to become active as soon as directed by your doctor, while still giving yourself time to heal.  Rest when you feel tired. You will be asked to do the following in order to speed your recovery:   - Cough and breathe deeply. This helps to clear and expand your lungs and can prevent pneumonia after surgery.  - [place]. Do mild physical activity. Walking or moving your legs help your circulation and body functions return to normal. Do not try to get up or walk alone the first time after surgery.   -If you develop swelling on one leg or the other, pain in the back of your leg, redness/warmth in one of your legs, please call the office or go to the Emergency Room to have a doppler to rule out a blood clot. For shortness of breath, chest pain-seek care in the Emergency Room as soon as possible. - Actively manage your pain. Managing your pain lets you move in comfort. We will ask you to rate your pain on a scale of zero to 10. It is your responsibility to tell your doctor or nurse where and how much you hurt so your pain can be treated.   Special Considerations -If you are diabetic, you may be placed on insulin after surgery to have closer control over your blood sugars  to promote healing and recovery.  This does not mean that you will be discharged on insulin.  If applicable, your oral antidiabetics will be resumed when you are tolerating a solid diet.   -Your final pathology results from surgery should be available around one week after surgery and the results will be relayed to you when available.   -Dr. Lahoma Crocker is the surgeon that assists your GYN Oncologist with surgery.  If you end up staying the night, the next day after your surgery you will either see Dr. Berline Lopes or Dr. Lahoma Crocker.   -FMLA forms can be faxed to  [phone] and please allow 5-7 business days for completion.   Pain Management After Surgery -You will be prescribed your pain medication and bowel regimen medications before surgery closer to the date so that you can have these available when you are discharged from the hospital. The pain medication is for use ONLY AFTER surgery and a new prescription will not be given.    -Make sure that you have Tylenol and Ibuprofen at home to use on a regular basis after surgery for pain control. We recommend alternating the medications every hour to six hours since they work differently and are processed in the body differently for pain relief.   -Review the attached handout on narcotic use and their risks and side effects.    Bowel Regimen -You will be prescribed Sennakot-S to take nightly to prevent constipation especially if you are taking the narcotic pain medication intermittently.  It is important to prevent constipation and drink adequate amounts of liquids. You can stop taking this medication when you are not taking pain medication and you are back on your normal bowel routine.   Risks of Surgery Risks of surgery are low but include bleeding, infection, damage to surrounding structures, re-operation, blood clots, and very rarely death.     Blood Transfusion Information (For the consent to be signed before surgery)   We will be checking your blood type before surgery so in case of emergencies, we will know what type of blood you would need.                                             WHAT IS A BLOOD TRANSFUSION?   A transfusion is the replacement of blood or some of its parts. Blood is made up of multiple cells which provide different functions. Red blood cells carry oxygen and are used for blood loss replacement. White blood cells fight against infection. Platelets control bleeding. Plasma helps clot blood. Other blood products are available for specialized needs, such as hemophilia or  other clotting disorders. BEFORE THE TRANSFUSION  Who gives blood for transfusions?  You may be able to donate blood to be used at a later date on yourself (autologous donation). Relatives can be asked to donate blood. This is generally not any safer than if you have received blood from a stranger. The same precautions are taken to ensure safety when a relative's blood is donated. Healthy volunteers who are fully evaluated to make sure their blood is safe. This is blood bank blood. Transfusion therapy is the safest it has ever been in the practice of medicine. Before blood is taken from a donor, a complete history is taken to make sure that person has no history of diseases nor engages in risky social behavior (examples  are intravenous drug use or sexual activity with multiple partners). The donor's travel history is screened to minimize risk of transmitting infections, such as malaria. The donated blood is tested for signs of infectious diseases, such as HIV and hepatitis. The blood is then tested to be sure it is compatible with you in order to minimize the chance of a transfusion reaction. If you or a relative donates blood, this is often done in anticipation of surgery and is not appropriate for emergency situations. It takes many days to process the donated blood. RISKS AND COMPLICATIONS Although transfusion therapy is very safe and saves many lives, the main dangers of transfusion include:  Getting an infectious disease. Developing a transfusion reaction. This is an allergic reaction to something in the blood you were given. Every precaution is taken to prevent this. The decision to have a blood transfusion has been considered carefully by your caregiver before blood is given. Blood is not given unless the benefits outweigh the risks.   AFTER SURGERY INSTRUCTIONS   Return to work: 4-6 weeks if applicable   Activity: 1. Be up and out of the bed during the day.  Take a nap if needed.  You may  walk up steps but be careful and use the hand rail.  Stair climbing will tire you more than you think, you may need to stop part way and rest.    2. No lifting or straining for 6 weeks over 10 pounds. No pushing, pulling, straining for 6 weeks.   3. No driving for 1 week(s).  Do not drive if you are taking narcotic pain medicine and make sure that your reaction time has returned.    4. You can shower as soon as the next day after surgery. Shower daily.  Use your regular soap and water (not directly on the incision) and pat your incision(s) dry afterwards; don't rub.  No tub baths or submerging your body in water until cleared by your surgeon. If you have the soap that was given to you by pre-surgical testing that was used before surgery, you do not need to use it afterwards because this can irritate your incisions.    5. No sexual activity and nothing in the vagina for 4 weeks.   6. You may experience a small amount of clear drainage from your incisions, which is normal.  If the drainage persists, increases, or changes color please call the office.   7. Do not use creams, lotions, or ointments such as neosporin on your incisions after surgery until advised by your surgeon because they can cause removal of the dermabond glue on your incisions.     8. You may experience vaginal spotting after surgery.  The spotting is normal but if you experience heavy bleeding, call our office.   9. Take Tylenol or ibuprofen first for pain and only use narcotic pain medication for severe pain not relieved by the Tylenol or Ibuprofen.  Monitor your Tylenol intake to a max of 4,000 mg in a 24 hour period. You can alternate these medications after surgery.   Diet: 1. Low sodium Heart Healthy Diet is recommended but you are cleared to resume your normal (before surgery) diet after your procedure.   2. It is safe to use a laxative, such as Miralax or Colace, if you have difficulty moving your bowels. You have been  prescribed Sennakot-S to take at bedtime every evening after surgery to keep bowel movements regular and to prevent constipation.  Wound Care: 1. Keep clean and dry.  Shower daily.   Reasons to call the Doctor: Fever - Oral temperature greater than 100.4 degrees Fahrenheit Foul-smelling vaginal discharge Difficulty urinating Nausea and vomiting Increased pain at the site of the incision that is unrelieved with pain medicine. Difficulty breathing with or without chest pain New calf pain especially if only on one side Sudden, continuing increased vaginal bleeding with or without clots.   Contacts: For questions or concerns you should contact:   Dr. Jeral Pinch at [phone]   Joylene John, NP at [phone]   After Hours: call [phone] and have the GYN Oncologist paged/contacted (after 5 pm or on the weekends).   Messages sent via mychart are for non-urgent matters and are not responded to after hours so for urgent needs, please call the after hours number.

## 2021-01-16 NOTE — Progress Notes (Signed)
Patient here with her cousin for new patient consultation with Dr. Jeral Pinch and for a pre-operative discussion prior to her scheduled surgery on January 24, 2021. She is scheduled for robotic assisted laparoscopic unilateral oophorectomy, bilateral salpingectomy, possible staging, oophoropexy of left ovary, possible laparotomy. The surgery was discussed in detail.  See after visit summary for additional details. Visual aids used to discuss items related to surgery including sequential compression stockings, foley catheter, IV pump, multi-modal pain regimen including tylenol, photo of the surgical robot, female reproductive system to discuss surgery in detail.      Discussed post-op pain management in detail including the aspects of the enhanced recovery pathway.  Advised her that a new prescription would be sent in closer to the surgery date and it is only to be used for after her upcoming surgery.  We discussed the use of tylenol post-op and to monitor for a maximum of 4,000 mg in a 24 hour period.  Also prescribed sennakot to be used after surgery and to hold if having loose stools.  Discussed bowel regimen in detail.     Discussed the use of SCDs and measures to take at home to prevent DVT including frequent mobility.  Reportable signs and symptoms of DVT discussed. Post-operative instructions discussed and expectations for after surgery. Incisional care discussed as well including reportable signs and symptoms including erythema, drainage, wound separation.     10 minutes spent with the patient.  Verbalizing understanding of material discussed. No needs or concerns voiced at the end of the visit.   Advised patient and family to call for any needs.  All upcoming appts reviewed with patient by Elmo Putt, navigator and myself.  This appointment is included in the global surgical bundle as pre-operative teaching and has no charge.

## 2021-01-16 NOTE — Progress Notes (Signed)
GYNECOLOGIC ONCOLOGY NEW PATIENT CONSULTATION   Patient Name: Alyssa Becker  Patient Age: 25 y.o. Date of Service: 01/16/2021 Referring Provider: Dr. 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  I had a long conversation with the patient and her cousin, who accompanied her to the visit today. I also had a short FaceTime conversation with the patient's mother. 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 This was somewhat unusual given young age and that she received the HPV vaccine. Given the patients obesity, she has risk factors for endometrial adenocarcinoma. Exam findings are more consistent with cervical origin. Additionally, based on immunohistochemical stain, showing patchy CEA, pathology favors that this is a Endocervical in origin. MRI shows A 4.5 cm mass centered in the cervix, supporting primary diagnosis of cervical cancer.  I have asked my office to reach out to pathology to clarify the grade and whether there is lymphovascular invasion. Given size of the lesion and depth of invasion based on MRI images, the patient will almost definitely require adjuvant treatment if she undergo surgery as she would meet Sedlis criteria. She is not a candidate for trachelectomy given given size of a tumor as well as its location. She voices not having interest in fertility although is quite upset by the diagnosis, understandably, and the implications regarding treatment.  We also discussed the complex adnexal mass. She was originally admitted several months ago and treated for PID. On MRI, this mass has significant complexity with solid and cystic features. While it still may represent a prior infectious process, I am  concerned by its appearance and a possibility that this may represent metastatic disease or a second primary. This distinction is paramount with regard to treatment planning. I am recommending that we proceed with scheduling surgery for removal of the abnormal ovary, bilateral fallopian tubes and pexing of the other ovary to get it out of the pelvis and radiation field. Plan will be for contained cyst drainage and pathology review while she is asleep for surgery. If second cancer identified, then additional staging procedures including lymph node biopsy and omentectomy would be performed as indicated.  The patient is scheduled for a PET scan tomorrow. I will let her know the results of this. Based on my exam today, her biopsy, an MRI findings, I recommend that we move forward with planning for treatment with definitive chemoradiation. Given the size of the tumor, based on her response to pelvic radiation, she may be a candidate for an interval extrafascial hysterectomy.  Will present at next tumor board to help with additional discussion of endometrial vs cervical origin.  A copy of this note was sent to the patient's referring provider.   24 minutes of total time was spent for this patient encounter, including preparation, face-to-face counseling with the patient and coordination of care, and documentation of the encounter.   Jeral Pinch, MD  Division of Gynecologic Oncology  Department of Obstetrics and Gynecology  [facility]  ___________________________________________  Chief Complaint: Chief Complaint  Patient presents with   Malignant neoplasm of cervix, unspecified site [facility])    History of Present Illness:  Alyssa Becker is a 25 y.o. y.o. female who is seen in consultation at the request of Dr. Nelda Marseille for an evaluation of  adenocarcinoma of the endocervix.  The patient was admitted back in September after she presented to the emergency department with  worsening sharp pelvic pain on the right for about 1 week.  Pain was so severe that she was having trouble sleeping.  Complex adnexal mass was noted on imaging and the patient was treated for presumed PID.  She received 24 hours of IV antibiotics and then was discharged home to finish an oral course of antibiotics.  She had significant improvement in her pain on antibiotics.  She described persistent intermittent pain after finishing antibiotics that was dull.  The pain then ultimately stopped but recently she began having pain again on her right side.  She describes this as dull, intermittent, with radiation down deep into the pelvis and up along her right flank.  Patient follow-up with OB/GYN after hospitalization.  On pelvic exam, cervix was noted to have a mass and be quite friable.  Pap test and biopsy were performed showing adenocarcinoma, favored to be endocervical in origin based on IHC stains.   She notes appetite has been up and down.  For the last for 5 days she has been significantly fatigued and has been spending a lot of time in bed.  She notes a couple of days of vaginal discharge, denies vaginal discharge previously.  Denies any nausea or emesis.  Reports regular bowel and bladder function.  Patient's medical history is notable for type 2 diabetes, on oral medications, blood glucose ranges between 120 and 140.  She also has asthma (uses her rescue inhaler twice a day) and anemia.  She has never had surgery.  Family history is notable for breast cancer in her mother.  Maternal aunt had leukemia, and another maternal aunt had some other cancer.  Patient lives in [place] with her aunt, she also lives with her cousin, Alyssa Becker, who is here with her today, and Alyssa Becker's family.  Denies any tobacco or alcohol use.  GYN history notable for menarche at age 17.  Patient endorses regular periods although sometimes would skip a month.  She notes long history of some intermenstrual spotting.  She has some  pelvic pain with her menses.  Recent Pap smear was her first Pap smear.  Mother confirms that she received the HPV vaccine.  Work-up history: 10/01/2020: Patient admitted with abdominal pain and pelvic pain as well as low-grade fever and anorexia for presumed diagnosis of right tubo-ovarian abscess.  Imaging revealed a complex adnexal mass.  Leukocytosis with a white blood count of 17.4 was noted.  Patient was treated with 24 hours of IV antibiotics and discharged home on a 14-day course of oral antibiotics. Pelvic ultrasound on 10/01/2020: Uterus measures 10 x 4.5 x 5.8 cm.  Left ovary normal in appearance.  Complex right adnexal mass measures 9.9 x 6.5 x 5.9 cm with both solid and cystic components.  Solid components associated with hypervascularity on color Doppler. CT of the abdomen and pelvis on 10/01/2020: Large mixed solid and cystic mass in the right ovary measuring up to 10.1 cm.  Recommend further evaluation with ultrasound or MRI.  Prominent appearance of the cervix, recommend correlation with physical exam and direct visualization to exclude cervical mass. CA-125 on 10/02/2020: 792 Patient seen for follow-up outpatient with OB/GYN on 12/14: Symptomatically much better than during her hospitalization after being treated for right TOA.  On pelvic exam, she was noted to have a friable and villous appearing mass at the cervical os, biopsy and Pap were performed. 01/02/2021: Pap  smear shows adenocarcinoma, NOS. 01/02/2021: Cervical biopsy shows adenocarcinoma, favor endocervical origin  PAST MEDICAL HISTORY:  Past Medical History:  Diagnosis Date   Anemia    Asthma    Diabetes mellitus without complication ([place])    Morbid obesity ([place])      PAST SURGICAL HISTORY:  History reviewed. No pertinent surgical history.  OB/GYN HISTORY:  OB History  Gravida Para Term Preterm AB Living  0 0 0 0 0 0  SAB IAB Ectopic Multiple Live Births  0 0 0 0 0    Patient's last menstrual period was  12/29/2020 (approximate).  Age at menarche: 38 Age at menopause: Not applicable Hx of HRT: Not applicable Last pap: See HPI History of abnormal pap smears: See HPI  SCREENING STUDIES:  Last mammogram: Has never had  Last colonoscopy: Has never had Last bone mineral density: Has never had  MEDICATIONS: Outpatient Encounter Medications as of 01/16/2021  Medication Sig   albuterol (VENTOLIN HFA) 108 (90 Base) MCG/ACT inhaler Inhale 1-2 puffs into the lungs every 6 (six) hours as needed for wheezing or shortness of breath.   amLODipine (NORVASC) 5 MG tablet Take 5 mg by mouth daily.   Ascorbic Acid (VITAMIN C) 500 MG CHEW Chew 500 mg by mouth daily. Take with iron tablets   ferrous sulfate 325 (65 FE) MG tablet Take 325 mg by mouth daily.    ibuprofen (ADVIL) 800 MG tablet Take 1 tablet (800 mg total) by mouth 3 (three) times daily with meals.   magnesium oxide (MAG-OX) 400 MG tablet Take 400 mg by mouth daily. 1 tablet daily for migraine prevention   metFORMIN (GLUCOPHAGE) 500 MG tablet Take 1 tablet (500 mg total) by mouth 2 (two) times daily with a meal.   No facility-administered encounter medications on file as of 01/16/2021.    ALLERGIES:  Allergies  Allergen Reactions   Pineapple Anaphylaxis and Swelling    Throat swells   Prednisone Other (See Comments)    Bloating     FAMILY HISTORY:  Family History  Problem Relation Age of Onset   Asthma Mother    Diabetes Mother    Hypertension Mother    Breast cancer Mother    Asthma Father    Diabetes Father    Kidney disease Father    Diabetes Sister    Asthma Sister    Asthma Brother    Diabetes Brother    Cancer Maternal Aunt    Diabetes Maternal Uncle    Diabetes Maternal Grandmother    Hypertension Maternal Grandmother    Anemia Maternal Grandmother    Heart attack Paternal Grandfather    Cancer Paternal Grandfather    Colon cancer Neg Hx    Ovarian cancer Neg Hx    Endometrial cancer Neg Hx    Pancreatic  cancer Neg Hx    Prostate cancer Neg Hx      SOCIAL HISTORY:  Social Connections: Not on file    REVIEW OF SYSTEMS:  Pertinent positives as per HPI Denies appetite changes, fevers, chills, unexplained weight changes. Denies hearing loss, neck lumps or masses, mouth sores, ringing in ears or voice changes. Denies cough or wheezing.  Denies shortness of breath. Denies chest pain or palpitations. Denies leg swelling. Denies abdominal distention, blood in stools, constipation, diarrhea, nausea, vomiting, or early satiety. Denies pain with intercourse, dysuria, frequency, hematuria or incontinence. Denies hot flashes, pelvic pain, vaginal bleeding or vaginal discharge.   Denies joint pain, back pain or muscle pain/cramps. Denies  itching, rash, or wounds. Denies dizziness, headaches, numbness or seizures. Denies swollen lymph nodes or glands, denies easy bruising or bleeding. Denies anxiety, depression, confusion, or decreased concentration.  Physical Exam:  Vital Signs for this encounter:  Blood pressure (!) 146/64, pulse 95, temperature (!) 97.5 F (36.4 C), temperature source Tympanic, resp. rate 18, height 5\' 6"  (1.676 m), weight 282 lb 6.4 oz (128.1 kg), last menstrual period 12/29/2020, SpO2 100 %. Body mass index is 45.58 kg/m. General: Alert, oriented, no acute distress.  HEENT: Normocephalic, atraumatic. Sclera anicteric.  Chest: Clear to auscultation bilaterally. No wheezes, rhonchi, or rales. Cardiovascular: Regular rate and rhythm, no murmurs, rubs, or gallops.  Abdomen: Obese. Normoactive bowel sounds. Soft, nondistended, nontender to palpation. No masses or hepatosplenomegaly appreciated. No palpable fluid wave.  Extremities: Grossly normal range of motion. Warm, well perfused. No edema bilaterally.  Skin: No rashes or lesions.  Lymphatics: No cervical, supraclavicular, or inguinal adenopathy.  GU:  Normal external female genitalia. No lesions. No discharge or bleeding.              Bladder/urethra:  No lesions or masses, well supported bladder             Vagina: Well rugated, no lesions or masses.             Cervix: Enlarged, central portion of the cervix is replaced by friable mass.  Somewhat difficult to see given body habitus.  On bimanual exam, cervix is 5-6 cm, barrel-shaped, and firm.  On rectovaginal exam, there is firmness and questionable nodularity along the posterior aspect of the cervix, not definitive for parametrial involvement.             Uterus: 8-10, mobile.             Adnexa: Mass appreciated on abdominal hands, not felt on bimanual rectovaginal exam, somewhat mobile, size difficult to appreciate given body habitus.  Rectal: Findings described above.  LABORATORY AND RADIOLOGIC DATA:  Outside medical records were reviewed to synthesize the above history, along with the history and physical obtained during the visit.   Lab Results  Component Value Date   WBC 12.6 (H) 10/02/2020   HGB 8.6 (L) 10/02/2020   HCT 30.3 (L) 10/02/2020   PLT 370 10/02/2020   GLUCOSE 129 (H) 10/02/2020   ALT 11 10/02/2020   AST 16 10/02/2020   NA 134 (L) 10/02/2020   K 3.7 10/02/2020   CL 103 10/02/2020   CREATININE 0.68 10/02/2020   BUN 10 10/02/2020   CO2 22 10/02/2020   INR 1.1 10/02/2020

## 2021-01-16 NOTE — Progress Notes (Signed)
Requested grade and LVI on accession [id number] with [facility]ology via email.

## 2021-01-16 NOTE — Progress Notes (Signed)
Met with Alyssa Becker and her cousin.  Reviewed upcoming appointments for a PET scan and appointments with Dr. Alvy Bimler on 12/06/21 and Dr. Sondra Come on 12/07/21.  Asked about treatment in [place] because she lives in [place].  She said she would like to come to [place] and does have transportation.  Also provided her with the [facility] and encouraged her to call with any questions or needs.

## 2021-01-16 NOTE — H&P (View-Only) (Signed)
GYNECOLOGIC ONCOLOGY NEW PATIENT CONSULTATION   Patient Name: Alyssa Becker  Patient Age: 25 y.o. Date of Service: 01/16/2021 Referring Provider: Dr. 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  I had a long conversation with the patient and her cousin, who accompanied her to the visit today. I also had a short FaceTime conversation with the patient's mother. 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 This was somewhat unusual given young age and that she received the HPV vaccine. Given the patients obesity, she has risk factors for endometrial adenocarcinoma. Exam findings are more consistent with cervical origin. Additionally, based on immunohistochemical stain, showing patchy CEA, pathology favors that this is a Endocervical in origin. MRI shows A 4.5 cm mass centered in the cervix, supporting primary diagnosis of cervical cancer.  I have asked my office to reach out to pathology to clarify the grade and whether there is lymphovascular invasion. Given size of the lesion and depth of invasion based on MRI images, the patient will almost definitely require adjuvant treatment if she undergo surgery as she would meet Sedlis criteria. She is not a candidate for trachelectomy given given size of a tumor as well as its location. She voices not having interest in fertility although is quite upset by the diagnosis, understandably, and the implications regarding treatment.  We also discussed the complex adnexal mass. She was originally admitted several months ago and treated for PID. On MRI, this mass has significant complexity with solid and cystic features. While it still may represent a prior infectious process, I am  concerned by its appearance and a possibility that this may represent metastatic disease or a second primary. This distinction is paramount with regard to treatment planning. I am recommending that we proceed with scheduling surgery for removal of the abnormal ovary, bilateral fallopian tubes and pexing of the other ovary to get it out of the pelvis and radiation field. Plan will be for contained cyst drainage and pathology review while she is asleep for surgery. If second cancer identified, then additional staging procedures including lymph node biopsy and omentectomy would be performed as indicated.  The patient is scheduled for a PET scan tomorrow. I will let her know the results of this. Based on my exam today, her biopsy, an MRI findings, I recommend that we move forward with planning for treatment with definitive chemoradiation. Given the size of the tumor, based on her response to pelvic radiation, she may be a candidate for an interval extrafascial hysterectomy.  Will present at next tumor board to help with additional discussion of endometrial vs cervical origin.  A copy of this note was sent to the patient's referring provider.   85 minutes of total time was spent for this patient encounter, including preparation, face-to-face counseling with the patient and coordination of care, and documentation of the encounter.   Jeral Pinch, MD  Division of Gynecologic Oncology  Department of Obstetrics and Gynecology  [facility]  ___________________________________________  Chief Complaint: Chief Complaint  Patient presents with   Malignant neoplasm of cervix, unspecified site [facility])    History of Present Illness:  Alyssa Becker is a 25 y.o. y.o. female who is seen in consultation at the request of Dr. Nelda Marseille for an evaluation of  adenocarcinoma of the endocervix.  The patient was admitted back in September after she presented to the emergency department with  worsening sharp pelvic pain on the right for about 1 week.  Pain was so severe that she was having trouble sleeping.  Complex adnexal mass was noted on imaging and the patient was treated for presumed PID.  She received 24 hours of IV antibiotics and then was discharged home to finish an oral course of antibiotics.  She had significant improvement in her pain on antibiotics.  She described persistent intermittent pain after finishing antibiotics that was dull.  The pain then ultimately stopped but recently she began having pain again on her right side.  She describes this as dull, intermittent, with radiation down deep into the pelvis and up along her right flank.  Patient follow-up with OB/GYN after hospitalization.  On pelvic exam, cervix was noted to have a mass and be quite friable.  Pap test and biopsy were performed showing adenocarcinoma, favored to be endocervical in origin based on IHC stains.   She notes appetite has been up and down.  For the last for 5 days she has been significantly fatigued and has been spending a lot of time in bed.  She notes a couple of days of vaginal discharge, denies vaginal discharge previously.  Denies any nausea or emesis.  Reports regular bowel and bladder function.  Patient's medical history is notable for type 2 diabetes, on oral medications, blood glucose ranges between 120 and 140.  She also has asthma (uses her rescue inhaler twice a day) and anemia.  She has never had surgery.  Family history is notable for breast cancer in her mother.  Maternal aunt had leukemia, and another maternal aunt had some other cancer.  Patient lives in [place] with her aunt, she also lives with her cousin, Cristie Hem, who is here with her today, and Alex's family.  Denies any tobacco or alcohol use.  GYN history notable for menarche at age 65.  Patient endorses regular periods although sometimes would skip a month.  She notes long history of some intermenstrual spotting.  She has some  pelvic pain with her menses.  Recent Pap smear was her first Pap smear.  Mother confirms that she received the HPV vaccine.  Work-up history: 10/01/2020: Patient admitted with abdominal pain and pelvic pain as well as low-grade fever and anorexia for presumed diagnosis of right tubo-ovarian abscess.  Imaging revealed a complex adnexal mass.  Leukocytosis with a white blood count of 17.4 was noted.  Patient was treated with 24 hours of IV antibiotics and discharged home on a 14-day course of oral antibiotics. Pelvic ultrasound on 10/01/2020: Uterus measures 10 x 4.5 x 5.8 cm.  Left ovary normal in appearance.  Complex right adnexal mass measures 9.9 x 6.5 x 5.9 cm with both solid and cystic components.  Solid components associated with hypervascularity on color Doppler. CT of the abdomen and pelvis on 10/01/2020: Large mixed solid and cystic mass in the right ovary measuring up to 10.1 cm.  Recommend further evaluation with ultrasound or MRI.  Prominent appearance of the cervix, recommend correlation with physical exam and direct visualization to exclude cervical mass. CA-125 on 10/02/2020: 792 Patient seen for follow-up outpatient with OB/GYN on 12/14: Symptomatically much better than during her hospitalization after being treated for right TOA.  On pelvic exam, she was noted to have a friable and villous appearing mass at the cervical os, biopsy and Pap were performed. 01/02/2021: Pap  smear shows adenocarcinoma, NOS. 01/02/2021: Cervical biopsy shows adenocarcinoma, favor endocervical origin  PAST MEDICAL HISTORY:  Past Medical History:  Diagnosis Date   Anemia    Asthma    Diabetes mellitus without complication ([place])    Morbid obesity ([place])      PAST SURGICAL HISTORY:  History reviewed. No pertinent surgical history.  OB/GYN HISTORY:  OB History  Gravida Para Term Preterm AB Living  0 0 0 0 0 0  SAB IAB Ectopic Multiple Live Births  0 0 0 0 0    Patient's last menstrual period was  12/29/2020 (approximate).  Age at menarche: 32 Age at menopause: Not applicable Hx of HRT: Not applicable Last pap: See HPI History of abnormal pap smears: See HPI  SCREENING STUDIES:  Last mammogram: Has never had  Last colonoscopy: Has never had Last bone mineral density: Has never had  MEDICATIONS: Outpatient Encounter Medications as of 01/16/2021  Medication Sig   albuterol (VENTOLIN HFA) 108 (90 Base) MCG/ACT inhaler Inhale 1-2 puffs into the lungs every 6 (six) hours as needed for wheezing or shortness of breath.   amLODipine (NORVASC) 5 MG tablet Take 5 mg by mouth daily.   Ascorbic Acid (VITAMIN C) 500 MG CHEW Chew 500 mg by mouth daily. Take with iron tablets   ferrous sulfate 325 (65 FE) MG tablet Take 325 mg by mouth daily.    ibuprofen (ADVIL) 800 MG tablet Take 1 tablet (800 mg total) by mouth 3 (three) times daily with meals.   magnesium oxide (MAG-OX) 400 MG tablet Take 400 mg by mouth daily. 1 tablet daily for migraine prevention   metFORMIN (GLUCOPHAGE) 500 MG tablet Take 1 tablet (500 mg total) by mouth 2 (two) times daily with a meal.   No facility-administered encounter medications on file as of 01/16/2021.    ALLERGIES:  Allergies  Allergen Reactions   Pineapple Anaphylaxis and Swelling    Throat swells   Prednisone Other (See Comments)    Bloating     FAMILY HISTORY:  Family History  Problem Relation Age of Onset   Asthma Mother    Diabetes Mother    Hypertension Mother    Breast cancer Mother    Asthma Father    Diabetes Father    Kidney disease Father    Diabetes Sister    Asthma Sister    Asthma Brother    Diabetes Brother    Cancer Maternal Aunt    Diabetes Maternal Uncle    Diabetes Maternal Grandmother    Hypertension Maternal Grandmother    Anemia Maternal Grandmother    Heart attack Paternal Grandfather    Cancer Paternal Grandfather    Colon cancer Neg Hx    Ovarian cancer Neg Hx    Endometrial cancer Neg Hx    Pancreatic  cancer Neg Hx    Prostate cancer Neg Hx      SOCIAL HISTORY:  Social Connections: Not on file    REVIEW OF SYSTEMS:  Pertinent positives as per HPI Denies appetite changes, fevers, chills, unexplained weight changes. Denies hearing loss, neck lumps or masses, mouth sores, ringing in ears or voice changes. Denies cough or wheezing.  Denies shortness of breath. Denies chest pain or palpitations. Denies leg swelling. Denies abdominal distention, blood in stools, constipation, diarrhea, nausea, vomiting, or early satiety. Denies pain with intercourse, dysuria, frequency, hematuria or incontinence. Denies hot flashes, pelvic pain, vaginal bleeding or vaginal discharge.   Denies joint pain, back pain or muscle pain/cramps. Denies  itching, rash, or wounds. Denies dizziness, headaches, numbness or seizures. Denies swollen lymph nodes or glands, denies easy bruising or bleeding. Denies anxiety, depression, confusion, or decreased concentration.  Physical Exam:  Vital Signs for this encounter:  Blood pressure (!) 146/64, pulse 95, temperature (!) 97.5 F (36.4 C), temperature source Tympanic, resp. rate 18, height 5\' 6"  (1.676 m), weight 282 lb 6.4 oz (128.1 kg), last menstrual period 12/29/2020, SpO2 100 %. Body mass index is 45.58 kg/m. General: Alert, oriented, no acute distress.  HEENT: Normocephalic, atraumatic. Sclera anicteric.  Chest: Clear to auscultation bilaterally. No wheezes, rhonchi, or rales. Cardiovascular: Regular rate and rhythm, no murmurs, rubs, or gallops.  Abdomen: Obese. Normoactive bowel sounds. Soft, nondistended, nontender to palpation. No masses or hepatosplenomegaly appreciated. No palpable fluid wave.  Extremities: Grossly normal range of motion. Warm, well perfused. No edema bilaterally.  Skin: No rashes or lesions.  Lymphatics: No cervical, supraclavicular, or inguinal adenopathy.  GU:  Normal external female genitalia. No lesions. No discharge or bleeding.              Bladder/urethra:  No lesions or masses, well supported bladder             Vagina: Well rugated, no lesions or masses.             Cervix: Enlarged, central portion of the cervix is replaced by friable mass.  Somewhat difficult to see given body habitus.  On bimanual exam, cervix is 5-6 cm, barrel-shaped, and firm.  On rectovaginal exam, there is firmness and questionable nodularity along the posterior aspect of the cervix, not definitive for parametrial involvement.             Uterus: 8-10, mobile.             Adnexa: Mass appreciated on abdominal hands, not felt on bimanual rectovaginal exam, somewhat mobile, size difficult to appreciate given body habitus.  Rectal: Findings described above.  LABORATORY AND RADIOLOGIC DATA:  Outside medical records were reviewed to synthesize the above history, along with the history and physical obtained during the visit.   Lab Results  Component Value Date   WBC 12.6 (H) 10/02/2020   HGB 8.6 (L) 10/02/2020   HCT 30.3 (L) 10/02/2020   PLT 370 10/02/2020   GLUCOSE 129 (H) 10/02/2020   ALT 11 10/02/2020   AST 16 10/02/2020   NA 134 (L) 10/02/2020   K 3.7 10/02/2020   CL 103 10/02/2020   CREATININE 0.68 10/02/2020   BUN 10 10/02/2020   CO2 22 10/02/2020   INR 1.1 10/02/2020

## 2021-01-16 NOTE — Patient Instructions (Addendum)
Plan to have your PET scan tomorrow and Dr. Berline Lopes will contact you with the results.   Preparing for your Surgery  Plan for surgery on January 5 or 10 th, 2023 (we will contact you once we have confirmed a date with the OR) with Dr. Jeral Pinch at [place] will be scheduled for robotic assisted laparoscopic unilateral oophorectomy (removal of one ovary), bilateral salpingectomy (removal of both fallopian tubes), possible staging, oophoropexy of left ovary (moving the left ovary out of the way of radiation field), possible laparotomy (larger incision on your abdomen if needed).   Pre-operative Testing -You will receive a phone call from presurgical testing at [facility] to arrange for a pre-operative appointment and lab work.  -Bring your insurance card, copy of an advanced directive if applicable, medication list  -At that visit, you will be asked to sign a consent for a possible blood transfusion in case a transfusion becomes necessary during surgery.  The need for a blood transfusion is rare but having consent is a necessary part of your care.     -You should not be taking blood thinners or aspirin at least ten days prior to surgery unless instructed by your surgeon.  -Do not take supplements such as fish oil (omega 3), red yeast rice, turmeric before your surgery. You want to avoid medications with aspirin in them including headache powders such as BC or Goody's), Excedrin migraine.  Day Before Surgery at [place] will be asked to take in a light diet the day before surgery. You will be advised you can have clear liquids up until 3 hours before your surgery.    Eat a light diet the day before surgery.  Examples including soups, broths, toast, yogurt, mashed potatoes.  AVOID GAS PRODUCING FOODS. Things to avoid include carbonated beverages (fizzy beverages, sodas), raw fruits and raw vegetables (uncooked), or beans.   If your bowels are filled with gas, your  surgeon will have difficulty visualizing your pelvic organs which increases your surgical risks.  Your role in recovery Your role is to become active as soon as directed by your doctor, while still giving yourself time to heal.  Rest when you feel tired. You will be asked to do the following in order to speed your recovery:  - Cough and breathe deeply. This helps to clear and expand your lungs and can prevent pneumonia after surgery.  - [place]. Do mild physical activity. Walking or moving your legs help your circulation and body functions return to normal. Do not try to get up or walk alone the first time after surgery.   -If you develop swelling on one leg or the other, pain in the back of your leg, redness/warmth in one of your legs, please call the office or go to the Emergency Room to have a doppler to rule out a blood clot. For shortness of breath, chest pain-seek care in the Emergency Room as soon as possible. - Actively manage your pain. Managing your pain lets you move in comfort. We will ask you to rate your pain on a scale of zero to 10. It is your responsibility to tell your doctor or nurse where and how much you hurt so your pain can be treated.  Special Considerations -If you are diabetic, you may be placed on insulin after surgery to have closer control over your blood sugars to promote healing and recovery.  This does not mean that you will  be discharged on insulin.  If applicable, your oral antidiabetics will be resumed when you are tolerating a solid diet.  -Your final pathology results from surgery should be available around one week after surgery and the results will be relayed to you when available.  -Dr. Lahoma Crocker is the surgeon that assists your GYN Oncologist with surgery.  If you end up staying the night, the next day after your surgery you will either see Dr. Berline Lopes or Dr. Lahoma Crocker.  -FMLA forms can be faxed to [phone] and  please allow 5-7 business days for completion.  Pain Management After Surgery -You will be prescribed your pain medication and bowel regimen medications before surgery closer to the date so that you can have these available when you are discharged from the hospital. The pain medication is for use ONLY AFTER surgery and a new prescription will not be given.   -Make sure that you have Tylenol and Ibuprofen at home to use on a regular basis after surgery for pain control. We recommend alternating the medications every hour to six hours since they work differently and are processed in the body differently for pain relief.  -Review the attached handout on narcotic use and their risks and side effects.   Bowel Regimen -You will be prescribed Sennakot-S to take nightly to prevent constipation especially if you are taking the narcotic pain medication intermittently.  It is important to prevent constipation and drink adequate amounts of liquids. You can stop taking this medication when you are not taking pain medication and you are back on your normal bowel routine.  Risks of Surgery Risks of surgery are low but include bleeding, infection, damage to surrounding structures, re-operation, blood clots, and very rarely death.   Blood Transfusion Information (For the consent to be signed before surgery)  We will be checking your blood type before surgery so in case of emergencies, we will know what type of blood you would need.                                            WHAT IS A BLOOD TRANSFUSION?  A transfusion is the replacement of blood or some of its parts. Blood is made up of multiple cells which provide different functions. Red blood cells carry oxygen and are used for blood loss replacement. White blood cells fight against infection. Platelets control bleeding. Plasma helps clot blood. Other blood products are available for specialized needs, such as hemophilia or other clotting  disorders. BEFORE THE TRANSFUSION  Who gives blood for transfusions?  You may be able to donate blood to be used at a later date on yourself (autologous donation). Relatives can be asked to donate blood. This is generally not any safer than if you have received blood from a stranger. The same precautions are taken to ensure safety when a relative's blood is donated. Healthy volunteers who are fully evaluated to make sure their blood is safe. This is blood bank blood. Transfusion therapy is the safest it has ever been in the practice of medicine. Before blood is taken from a donor, a complete history is taken to make sure that person has no history of diseases nor engages in risky social behavior (examples are intravenous drug use or sexual activity with multiple partners). The donor's travel history is screened to minimize risk of transmitting infections, such as malaria. The  donated blood is tested for signs of infectious diseases, such as HIV and hepatitis. The blood is then tested to be sure it is compatible with you in order to minimize the chance of a transfusion reaction. If you or a relative donates blood, this is often done in anticipation of surgery and is not appropriate for emergency situations. It takes many days to process the donated blood. RISKS AND COMPLICATIONS Although transfusion therapy is very safe and saves many lives, the main dangers of transfusion include:  Getting an infectious disease. Developing a transfusion reaction. This is an allergic reaction to something in the blood you were given. Every precaution is taken to prevent this. The decision to have a blood transfusion has been considered carefully by your caregiver before blood is given. Blood is not given unless the benefits outweigh the risks.  AFTER SURGERY INSTRUCTIONS  Return to work: 4-6 weeks if applicable  Activity: 1. Be up and out of the bed during the day.  Take a nap if needed.  You may walk up steps but  be careful and use the hand rail.  Stair climbing will tire you more than you think, you may need to stop part way and rest.   2. No lifting or straining for 6 weeks over 10 pounds. No pushing, pulling, straining for 6 weeks.  3. No driving for 1 week(s).  Do not drive if you are taking narcotic pain medicine and make sure that your reaction time has returned.   4. You can shower as soon as the next day after surgery. Shower daily.  Use your regular soap and water (not directly on the incision) and pat your incision(s) dry afterwards; don't rub.  No tub baths or submerging your body in water until cleared by your surgeon. If you have the soap that was given to you by pre-surgical testing that was used before surgery, you do not need to use it afterwards because this can irritate your incisions.   5. No sexual activity and nothing in the vagina for 4 weeks.  6. You may experience a small amount of clear drainage from your incisions, which is normal.  If the drainage persists, increases, or changes color please call the office.  7. Do not use creams, lotions, or ointments such as neosporin on your incisions after surgery until advised by your surgeon because they can cause removal of the dermabond glue on your incisions.    8. You may experience vaginal spotting after surgery.  The spotting is normal but if you experience heavy bleeding, call our office.  9. Take Tylenol or ibuprofen first for pain and only use narcotic pain medication for severe pain not relieved by the Tylenol or Ibuprofen.  Monitor your Tylenol intake to a max of 4,000 mg in a 24 hour period. You can alternate these medications after surgery.  Diet: 1. Low sodium Heart Healthy Diet is recommended but you are cleared to resume your normal (before surgery) diet after your procedure.  2. It is safe to use a laxative, such as Miralax or Colace, if you have difficulty moving your bowels. You have been prescribed Sennakot-S to take  at bedtime every evening after surgery to keep bowel movements regular and to prevent constipation.    Wound Care: 1. Keep clean and dry.  Shower daily.  Reasons to call the Doctor: Fever - Oral temperature greater than 100.4 degrees Fahrenheit Foul-smelling vaginal discharge Difficulty urinating Nausea and vomiting Increased pain at the site of  the incision that is unrelieved with pain medicine. Difficulty breathing with or without chest pain New calf pain especially if only on one side Sudden, continuing increased vaginal bleeding with or without clots.   Contacts: For questions or concerns you should contact:  Dr. Jeral Pinch at [phone]  Joylene John, NP at [phone]  After Hours: call [phone] and have the GYN Oncologist paged/contacted (after 5 pm or on the weekends).  Messages sent via mychart are for non-urgent matters and are not responded to after hours so for urgent needs, please call the after hours number.

## 2021-01-17 ENCOUNTER — Encounter (HOSPITAL_COMMUNITY): Payer: Self-pay | Admitting: Hematology

## 2021-01-17 ENCOUNTER — Other Ambulatory Visit: Payer: Self-pay

## 2021-01-17 ENCOUNTER — Ambulatory Visit (HOSPITAL_COMMUNITY): Admission: RE | Admit: 2021-01-17 | Payer: Medicaid Other | Source: Ambulatory Visit

## 2021-01-17 ENCOUNTER — Other Ambulatory Visit: Payer: Self-pay | Admitting: Gynecologic Oncology

## 2021-01-17 ENCOUNTER — Telehealth: Payer: Self-pay | Admitting: *Deleted

## 2021-01-17 ENCOUNTER — Ambulatory Visit (HOSPITAL_COMMUNITY)
Admission: RE | Admit: 2021-01-17 | Discharge: 2021-01-17 | Disposition: A | Discharge status: Home / Self Care | Payer: Medicaid Other | Source: Ambulatory Visit | Attending: Obstetrics & Gynecology | Admitting: Obstetrics & Gynecology

## 2021-01-17 ENCOUNTER — Encounter (HOSPITAL_COMMUNITY): Payer: Self-pay

## 2021-01-17 DIAGNOSIS — N838 Other noninflammatory disorders of ovary, fallopian tube and broad ligament: Secondary | ICD-10-CM

## 2021-01-17 DIAGNOSIS — C539 Malignant neoplasm of cervix uteri, unspecified: Secondary | ICD-10-CM | POA: Insufficient documentation

## 2021-01-17 LAB — SURGICAL PATHOLOGY

## 2021-01-17 NOTE — Telephone Encounter (Signed)
[facility] called, patient ate food prior to her PET scan. PET scan canceled. PET scan rescheduled to 1/3 at 9 am. Patient given the new appt for 1/3 at 9 am with an arrival time of 8:30 am. Patient instructed no food, candy, gum and hold metformin the morning of appt. Patient was told she can drink water only. Patient verbalized understanding

## 2021-01-17 NOTE — Progress Notes (Signed)
Surgery orders requested via Epic inbox. °

## 2021-01-17 NOTE — Patient Instructions (Addendum)
DUE TO COVID-19 ONLY ONE VISITOR IS ALLOWED TO COME WITH YOU AND STAY IN THE WAITING ROOM ONLY DURING PRE OP AND PROCEDURE.    **NO VISITORS ARE ALLOWED IN THE SHORT STAY AREA OR RECOVERY ROOM!!**  You are not required to quarantine, however you are required to wear a well-fitted mask when you are out and around people not in your household.  Hand Hygiene often Do NOT share personal items Notify your provider if you are in close contact with someone who has COVID or you develop fever 100.4 or greater, new onset of sneezing, cough, sore throat, shortness of breath or body aches.       Your procedure is scheduled on: Thursday 01-24-21   Report to [facility] Main  Entrance     Report to admitting at 5:15 AM   Eat a light diet the day before surgery.  Examples including soups, broths, toast, yogurt, mashed potatoes.  Things to avoid include carbonated beverages (fizzy beverages), raw fruits and raw vegetables, or beans.   If your bowels are filled with gas, your surgeon will have difficulty visualizing your pelvic organs which increases your surgical risks.    Call this number if you have problems the morning of surgery [phone]   Do not eat food :After Midnight.   May have liquids until 4:30 AM day of surgery  CLEAR LIQUID DIET  Foods Allowed                                                                     Foods Excluded  Water, Black Coffee (no milk/no creamer) and tea, regular and decaf                                                liquids that you cannot  Plain Jell-O in any flavor  (No red)                                             see through such as: Fruit ices (not with fruit pulp)                                                    milk, soups, orange juice  Iced Popsicles (No red)                                                      All solid food                             Apple juices Sports drinks like Gatorade (No red) Lightly seasoned clear broth or  consume(fat free) Sugar  The day of surgery:  Drink ONE (1) Pre-Surgery G2 the morning of surgery. Drink in one sitting. Do not sip.  This drink was given to you during your hospital  pre-op appointment visit. Nothing else to drink after completing the Pre-Surgery G2.          If you have questions, please contact your surgeons office.     Oral Hygiene is also important to reduce your risk of infection.                                     Remember - BRUSH YOUR TEETH THE MORNING OF SURGERY WITH YOUR REGULAR TOOTHPASTE   Do NOT smoke after Midnight  Take these medicines the morning of surgery with A SIP OF WATER:  Tylenol.  Okay to use Albuterol inhaler   How to Manage Your Diabetes Before and After Surgery  Why is it important to control my blood sugar before and after surgery? Improving blood sugar levels before and after surgery helps healing and can limit problems. A way of improving blood sugar control is eating a healthy diet by:  Eating less sugar and carbohydrates  Increasing activity/exercise  Talking with your doctor about reaching your blood sugar goals High blood sugars (greater than 180 mg/dL) can raise your risk of infections and slow your recovery, so you will need to focus on controlling your diabetes during the weeks before surgery. Make sure that the doctor who takes care of your diabetes knows about your planned surgery including the date and location.  How do I manage my blood sugar before surgery? Check your blood sugar at least 4 times a day, starting 2 days before surgery, to make sure that the level is not too high or low. Check your blood sugar the morning of your surgery when you wake up and every 2 hours until you get to the Short Stay unit. If your blood sugar is less than 70 mg/dL, you will need to treat for low blood sugar: Do not take insulin. Treat a low blood sugar (less than 70 mg/dL) with  cup of clear juice (cranberry or apple), 4  glucose tablets, OR glucose gel. Recheck blood sugar in 15 minutes after treatment (to make sure it is greater than 70 mg/dL). If your blood sugar is not greater than 70 mg/dL on recheck, call [phone] for further instructions. Report your blood sugar to the short stay nurse when you get to Short Stay.  If you are admitted to the hospital after surgery: Your blood sugar will be checked by the staff and you will probably be given insulin after surgery (instead of oral diabetes medicines) to make sure you have good blood sugar levels. The goal for blood sugar control after surgery is 80-180 mg/dL.   WHAT DO I DO ABOUT MY DIABETES MEDICATION?  Do not take oral diabetes medicines (pills) the morning of surgery.  THE DAY BEFORE SURGERY: Take Metformin as prescribed.  THE MORNING OF SURGERY:  Do not take Metformin.  Reviewed and Endorsed by [facility] Patient Education Committee, August 2015    Stop all vitamins and herbal supplements a week before surgery             You may not have any metal on your body including hair pins, jewelry, and body piercing             Do not wear make-up,  lotions, powders, perfumes or deodorant  Do not wear nail polish including gel and S&S, artificial/acrylic nails, or any other type of covering on natural nails including finger and toenails. If you have artificial nails, gel coating, etc. that needs to be removed by a nail salon please have this removed prior to surgery or surgery may need to be canceled/ delayed if the surgeon/ anesthesia feels like they are unable to be safely monitored.   Do not shave  48 hours prior to surgery.   Do not bring valuables to the hospital. [place].   Contacts, dentures or bridgework may not be worn into surgery.   Patients discharged the day of surgery will not be allowed to drive home.  Special Instructions: Bring a copy of your healthcare power of attorney and living will  documents the day of surgery if you haven't scanned them in before.  Please read over the following fact sheets you were given: IF YOU HAVE QUESTIONS ABOUT YOUR PRE OP INSTRUCTIONS PLEASE CALL [phone]   [place] - Preparing for Surgery Before surgery, you can play an important role.  Because skin is not sterile, your skin needs to be as free of germs as possible.  You can reduce the number of germs on your skin by washing with CHG (chlorahexidine gluconate) soap before surgery.  CHG is an antiseptic cleaner which kills germs and bonds with the skin to continue killing germs even after washing. Please DO NOT use if you have an allergy to CHG or antibacterial soaps.  If your skin becomes reddened/irritated stop using the CHG and inform your nurse when you arrive at Short Stay. Do not shave (including legs and underarms) for at least 48 hours prior to the first CHG shower.  You may shave your face/neck.  Please follow these instructions carefully:  1.  Shower with CHG Soap the night before surgery and the  morning of surgery.  2.  If you choose to wash your hair, wash your hair first as usual with your normal  shampoo.  3.  After you shampoo, rinse your hair and body thoroughly to remove the shampoo.                             4.  Use CHG as you would any other liquid soap.  You can apply chg directly to the skin and wash.  Gently with a scrungie or clean washcloth.  5.  Apply the CHG Soap to your body ONLY FROM THE NECK DOWN.   Do   not use on face/ open                           Wound or open sores. Avoid contact with eyes, ears mouth and   genitals (private parts).                       Wash face,  Genitals (private parts) with your normal soap.             6.  Wash thoroughly, paying special attention to the area where your    surgery  will be performed.  7.  Thoroughly rinse your body with warm water from the neck down.  8.  DO NOT shower/wash with your normal soap after using and  rinsing off the CHG Soap.  9.  Pat yourself dry with a clean towel.            10.  Wear clean pajamas.            11.  Place clean sheets on your bed the night of your first shower and do not  sleep with pets. Day of Surgery : Do not apply any lotions/deodorants the morning of surgery.  Please wear clean clothes to the hospital/surgery center.  FAILURE TO FOLLOW THESE INSTRUCTIONS MAY RESULT IN THE CANCELLATION OF YOUR SURGERY  PATIENT SIGNATURE_________________________________  NURSE SIGNATURE__________________________________  ________________________________________________________________________

## 2021-01-18 ENCOUNTER — Encounter (HOSPITAL_COMMUNITY): Payer: Self-pay

## 2021-01-18 ENCOUNTER — Encounter (HOSPITAL_COMMUNITY)
Admission: RE | Admit: 2021-01-18 | Discharge: 2021-01-18 | Disposition: A | Discharge status: Home / Self Care | Payer: Medicaid Other | Source: Ambulatory Visit | Attending: Gynecologic Oncology | Admitting: Gynecologic Oncology

## 2021-01-18 ENCOUNTER — Telehealth: Payer: Self-pay | Admitting: Gynecologic Oncology

## 2021-01-18 ENCOUNTER — Telehealth: Payer: Self-pay | Admitting: Physician Assistant

## 2021-01-18 ENCOUNTER — Other Ambulatory Visit: Payer: Self-pay

## 2021-01-18 ENCOUNTER — Other Ambulatory Visit: Payer: Self-pay | Admitting: Gynecologic Oncology

## 2021-01-18 VITALS — BP 141/75 | HR 79 | Temp 98.2°F | Resp 18 | Ht 66.0 in | Wt 281.0 lb

## 2021-01-18 DIAGNOSIS — N839 Noninflammatory disorder of ovary, fallopian tube and broad ligament, unspecified: Secondary | ICD-10-CM | POA: Diagnosis not present

## 2021-01-18 DIAGNOSIS — D509 Iron deficiency anemia, unspecified: Secondary | ICD-10-CM | POA: Insufficient documentation

## 2021-01-18 DIAGNOSIS — E119 Type 2 diabetes mellitus without complications: Secondary | ICD-10-CM | POA: Diagnosis not present

## 2021-01-18 DIAGNOSIS — N838 Other noninflammatory disorders of ovary, fallopian tube and broad ligament: Secondary | ICD-10-CM

## 2021-01-18 DIAGNOSIS — C539 Malignant neoplasm of cervix uteri, unspecified: Secondary | ICD-10-CM | POA: Diagnosis not present

## 2021-01-18 DIAGNOSIS — D649 Anemia, unspecified: Secondary | ICD-10-CM

## 2021-01-18 DIAGNOSIS — D500 Iron deficiency anemia secondary to blood loss (chronic): Secondary | ICD-10-CM

## 2021-01-18 DIAGNOSIS — Z01818 Encounter for other preprocedural examination: Secondary | ICD-10-CM | POA: Diagnosis not present

## 2021-01-18 DIAGNOSIS — J45909 Unspecified asthma, uncomplicated: Secondary | ICD-10-CM | POA: Diagnosis not present

## 2021-01-18 HISTORY — DX: Depression, unspecified: F32.A

## 2021-01-18 HISTORY — DX: Anxiety disorder, unspecified: F41.9

## 2021-01-18 HISTORY — DX: Headache, unspecified: R51.9

## 2021-01-18 LAB — COMPREHENSIVE METABOLIC PANEL
ALT: 11 U/L (ref 0–44)
AST: 12 U/L — ABNORMAL LOW (ref 15–41)
Albumin: 4.3 g/dL (ref 3.5–5.0)
Alkaline Phosphatase: 92 U/L (ref 38–126)
Anion gap: 8 (ref 5–15)
BUN: 14 mg/dL (ref 6–20)
CO2: 24 mmol/L (ref 22–32)
Calcium: 8.8 mg/dL — ABNORMAL LOW (ref 8.9–10.3)
Chloride: 104 mmol/L (ref 98–111)
Creatinine, Ser: 0.58 mg/dL (ref 0.44–1.00)
GFR, Estimated: >60 mL/min (ref >60)
Glucose, Bld: 108 mg/dL — ABNORMAL HIGH (ref 70–99)
Potassium: 4.1 mmol/L (ref 3.5–5.1)
Sodium: 136 mmol/L (ref 135–145)
Total Bilirubin: 0.3 mg/dL (ref 0.3–1.2)
Total Protein: 8.1 g/dL (ref 6.5–8.1)

## 2021-01-18 LAB — CBC
HCT: 30 % — ABNORMAL LOW (ref 36.0–46.0)
Hemoglobin: 7.7 g/dL — ABNORMAL LOW (ref 12.0–15.0)
MCH: 15.7 pg — ABNORMAL LOW (ref 26.0–34.0)
MCHC: 25.7 g/dL — ABNORMAL LOW (ref 30.0–36.0)
MCV: 61.2 fL — ABNORMAL LOW (ref 80.0–100.0)
Platelets: 442 10*3/uL — ABNORMAL HIGH (ref 150–400)
RBC: 4.9 MIL/uL (ref 3.87–5.11)
RDW: 21.7 % — ABNORMAL HIGH (ref 11.5–15.5)
WBC: 10.5 10*3/uL (ref 4.0–10.5)
nRBC: 0 % (ref 0.0–0.2)

## 2021-01-18 LAB — GLUCOSE, CAPILLARY: Glucose-Capillary: 110 mg/dL — ABNORMAL HIGH (ref 70–99)

## 2021-01-18 LAB — HEMOGLOBIN A1C
Hgb A1c MFr Bld: 5.3 % (ref 4.8–5.6)
Mean Plasma Glucose: 105.41 mg/dL

## 2021-01-18 NOTE — Progress Notes (Addendum)
PCP - [facility] center Cardiologist - no  PPM/ICD -  Device Orders -  Rep Notified -   Chest x-ray -  EKG -  Stress Test -  ECHO -  Cardiac Cath -   Sleep Study -  CPAP -   Fasting Blood Sugar - 120-130 Checks Blood Sugar __1___ times a week  Blood Thinner Instructions: Aspirin Instructions:  ERAS Protcol - PRE-SURGERY G2-   COVID TEST- n/a COVID vaccine -x2  Activity--Can run up stairs without SOB Anesthesia review: DM, HGB 7.7  Patient denies shortness of breath, fever, cough and chest pain at PAT appointment   All instructions explained to the patient, with a verbal understanding of the material. Patient agrees to go over the instructions while at home for a better understanding. Patient also instructed to self quarantine after being tested for COVID-19. The opportunity to ask questions was provided.

## 2021-01-18 NOTE — Telephone Encounter (Signed)
Scheduled per 12/30 secure chat, patient is notified of upcoming appointments.

## 2021-01-19 LAB — CA 125: Cancer Antigen (CA) 125: 350 U/mL — ABNORMAL HIGH (ref 0.0–38.1)

## 2021-01-21 ENCOUNTER — Other Ambulatory Visit: Payer: Self-pay | Admitting: Physician Assistant

## 2021-01-21 DIAGNOSIS — D509 Iron deficiency anemia, unspecified: Secondary | ICD-10-CM

## 2021-01-22 ENCOUNTER — Inpatient Hospital Stay: Payer: Medicaid Other

## 2021-01-22 ENCOUNTER — Telehealth: Payer: Self-pay

## 2021-01-22 ENCOUNTER — Ambulatory Visit (HOSPITAL_COMMUNITY)
Admission: RE | Admit: 2021-01-22 | Discharge: 2021-01-22 | Disposition: A | Discharge status: Home / Self Care | Payer: Medicaid Other | Source: Ambulatory Visit | Attending: Obstetrics & Gynecology | Admitting: Obstetrics & Gynecology

## 2021-01-22 ENCOUNTER — Other Ambulatory Visit: Payer: Self-pay

## 2021-01-22 ENCOUNTER — Inpatient Hospital Stay: Payer: Medicaid Other | Admitting: Physician Assistant

## 2021-01-22 ENCOUNTER — Inpatient Hospital Stay: Payer: Medicaid Other | Attending: Gynecologic Oncology

## 2021-01-22 ENCOUNTER — Telehealth: Payer: Self-pay | Admitting: Gynecologic Oncology

## 2021-01-22 DIAGNOSIS — D509 Iron deficiency anemia, unspecified: Secondary | ICD-10-CM

## 2021-01-22 DIAGNOSIS — C539 Malignant neoplasm of cervix uteri, unspecified: Secondary | ICD-10-CM | POA: Diagnosis not present

## 2021-01-22 DIAGNOSIS — R590 Localized enlarged lymph nodes: Secondary | ICD-10-CM | POA: Insufficient documentation

## 2021-01-22 DIAGNOSIS — E119 Type 2 diabetes mellitus without complications: Secondary | ICD-10-CM | POA: Insufficient documentation

## 2021-01-22 DIAGNOSIS — D500 Iron deficiency anemia secondary to blood loss (chronic): Secondary | ICD-10-CM | POA: Diagnosis not present

## 2021-01-22 DIAGNOSIS — Z803 Family history of malignant neoplasm of breast: Secondary | ICD-10-CM

## 2021-01-22 LAB — CBC WITH DIFFERENTIAL (CANCER CENTER ONLY)
Abs Immature Granulocytes: 0.04 10*3/uL (ref 0.00–0.07)
Basophils Absolute: 0.1 10*3/uL (ref 0.0–0.1)
Basophils Relative: 1 %
Eosinophils Absolute: 0.2 10*3/uL (ref 0.0–0.5)
Eosinophils Relative: 2 %
HCT: 29.1 % — ABNORMAL LOW (ref 36.0–46.0)
Hemoglobin: 7.8 g/dL — ABNORMAL LOW (ref 12.0–15.0)
Immature Granulocytes: 0 %
Lymphocytes Relative: 30 %
Lymphs Abs: 2.8 10*3/uL (ref 0.7–4.0)
MCH: 16 pg — ABNORMAL LOW (ref 26.0–34.0)
MCHC: 26.8 g/dL — ABNORMAL LOW (ref 30.0–36.0)
MCV: 59.6 fL — ABNORMAL LOW (ref 80.0–100.0)
Monocytes Absolute: 0.5 10*3/uL (ref 0.1–1.0)
Monocytes Relative: 5 %
Neutro Abs: 6 10*3/uL (ref 1.7–7.7)
Neutrophils Relative %: 62 %
Platelet Count: 412 10*3/uL — ABNORMAL HIGH (ref 150–400)
RBC: 4.88 MIL/uL (ref 3.87–5.11)
RDW: 21.4 % — ABNORMAL HIGH (ref 11.5–15.5)
WBC Count: 9.6 10*3/uL (ref 4.0–10.5)
nRBC: 0 % (ref 0.0–0.2)

## 2021-01-22 LAB — RETIC PANEL
Immature Retic Fract: 28.6 % — ABNORMAL HIGH (ref 2.3–15.9)
RBC.: 4.86 MIL/uL (ref 3.87–5.11)
Retic Count, Absolute: 90.9 10*3/uL (ref 19.0–186.0)
Retic Ct Pct: 1.9 % (ref 0.4–3.1)
Reticulocyte Hemoglobin: 14.3 pg — ABNORMAL LOW (ref >27.9)

## 2021-01-22 LAB — IRON AND IRON BINDING CAPACITY (CC-WL,HP ONLY)
Iron: 15 ug/dL — ABNORMAL LOW (ref 28–170)
Saturation Ratios: 4 % — ABNORMAL LOW (ref 10.4–31.8)
TIBC: 374 ug/dL (ref 250–450)
UIBC: 359 ug/dL (ref 148–442)

## 2021-01-22 LAB — FERRITIN: Ferritin: 5 ng/mL — ABNORMAL LOW (ref 11–307)

## 2021-01-22 LAB — PREPARE RBC (CROSSMATCH)

## 2021-01-22 LAB — SAMPLE TO BLOOD BANK

## 2021-01-22 LAB — GLUCOSE, CAPILLARY: Glucose-Capillary: 105 mg/dL — ABNORMAL HIGH (ref 70–99)

## 2021-01-22 MED ORDER — FLUDEOXYGLUCOSE F - 18 (FDG) INJECTION
14.0000 | Freq: Once | INTRAVENOUS | Status: AC
  Administered 2021-01-22: 14 via INTRAVENOUS

## 2021-01-22 MED ORDER — SODIUM CHLORIDE 0.9% IV SOLUTION
250.0000 mL | Freq: Once | INTRAVENOUS | Status: AC
  Administered 2021-01-22: 250 mL via INTRAVENOUS

## 2021-01-22 MED ORDER — ACETAMINOPHEN 325 MG PO TABS
650.0000 mg | ORAL_TABLET | Freq: Once | ORAL | Status: AC
  Administered 2021-01-22: 650 mg via ORAL
  Filled 2021-01-22: qty 2

## 2021-01-22 MED ORDER — DIPHENHYDRAMINE HCL 25 MG PO CAPS
25.0000 mg | ORAL_CAPSULE | Freq: Once | ORAL | Status: AC
  Administered 2021-01-22: 25 mg via ORAL
  Filled 2021-01-22: qty 1

## 2021-01-22 NOTE — Telephone Encounter (Signed)
I received a message from the patient's mother who had called the clinic asking if her surgery could be rescheduled until late February.  The patient happened to be in the infusion center getting blood for surgery on Thursday.  I went to speak with her to get permission to call her mother to discuss more details regarding her treatment.  The patient has been struggling with her mother who would like her to wait to have surgery until February when she can take some time off to be in town with the patient.  The patient and I reviewed that she has at least stage I disease.  The PET scan as well as surgery will help to determine evidence of cancer spread.  I do not want to delay surgery until February.  The patient also voices that she does not want to wait until February.  She would prefer that I not speak with her mother about details regarding her treatment.  Because the mother had called and left a message, I called her mother back.  I let her know that I had just spoken with the patient and that I am asking that she may discuss further details with the patient per the patient's request.  Jeral Pinch MD Gynecologic Oncology

## 2021-01-22 NOTE — Progress Notes (Signed)
[place] Telephone:(336) [phone]   Fax:(336) [place] NOTE  Patient Care Team: Health, [facility] Dept Personal as PCP - General [name], Craige Cotta, RN as Oncology Nurse Navigator (Oncology)   CHIEF COMPLAINTS/PURPOSE OF CONSULTATION:  Iron deficiency anemia  HISTORY OF PRESENTING ILLNESS:  Alyssa Becker 26 y.o. female with medical history significant for anemia, anxiety, depression, asthma, type 2 diabetes, migraines and obesity.  Patient is unaccompanied for this visit.  On review of the previous records, Ms. 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  Patient is scheduled for surgery on 01/24/2021 with Dr. Berline Lopes.  Labs from 01/18/2021 show microcytic anemia with a hemoglobin of 7.7, MCV 61.2.   On exam today, Ms. Neglia reports that her energy levels are fairly stable.  She does have fatigue but continues to complete her daily activities on her own.  She has a good appetite and denies any dietary restrictions.  Patient denies nausea, vomiting or abdominal pain.  Her bowel habits are unchanged without any diarrhea or constipation.  Patient denies easy bruising or signs of bleeding except for her monthly menstrual cycle.  She reports her menstrual cycle last 3 to 5 days with 2 days of heavy bleeding.  She reports that she changes her tampon/pad every hour during her period.  She is currently taking ferrous sulfate 325 mg tablet once daily for the past year.  She denies fevers, chills, night sweats, shortness of breath, chest pain or cough.  She has no other complaints.  Remainder of the 10 point ROS is below  MEDICAL HISTORY:  Past Medical History:  Diagnosis Date   Anemia    Anxiety    Asthma    Depression    Diabetes mellitus without complication (HCC)    type 2   Headache    migraine   Morbid obesity (HCC)    Ovarian  mass     SURGICAL HISTORY: Past Surgical History:  Procedure Laterality Date   TONSILLECTOMY      SOCIAL HISTORY: Social History   Socioeconomic History   Marital status: Single    Spouse name: Not on file   Number of children: Not on file   Years of education: Not on file   Highest education level: Not on file  Occupational History   Not on file  Tobacco Use   Smoking status: Never   Smokeless tobacco: Never  Vaping Use   Vaping Use: Never used  Substance and Sexual Activity   Alcohol use: Never   Drug use: Never   Sexual activity: Not Currently    Birth control/protection: None    Comment: female partner  Other Topics Concern   Not on file  Social History Narrative   Not on file   Social Determinants of Health   Financial Resource Strain: Not on file  Food Insecurity: Not on file  Transportation Needs: Not on file  Physical Activity: Not on file  Stress: Not on file  Social Connections: Not on file  Intimate Partner Violence: Not on file    FAMILY HISTORY: Family History  Problem Relation Age of Onset   Asthma Mother    Diabetes Mother    Hypertension Mother    Breast cancer Mother    Asthma Father    Diabetes Father    Kidney disease Father    Diabetes Sister    Asthma Sister  Asthma Brother    Diabetes Brother    Cancer Maternal Aunt    Diabetes Maternal Uncle    Diabetes Maternal Grandmother    Hypertension Maternal Grandmother    Anemia Maternal Grandmother    Heart attack Paternal Grandfather    Cancer Paternal Grandfather    Colon cancer Neg Hx    Ovarian cancer Neg Hx    Endometrial cancer Neg Hx    Pancreatic cancer Neg Hx    Prostate cancer Neg Hx     ALLERGIES:  is allergic to pineapple and prednisone.  MEDICATIONS:  Current Outpatient Medications  Medication Sig Dispense Refill   acetaminophen (TYLENOL) 500 MG tablet Take 1,000 mg by mouth every 6 (six) hours as needed for moderate pain.     albuterol (VENTOLIN HFA) 108  (90 Base) MCG/ACT inhaler Inhale 1-2 puffs into the lungs every 6 (six) hours as needed for wheezing or shortness of breath.     Ascorbic Acid (VITAMIN C) 500 MG CHEW Chew 500 mg by mouth daily.     ferrous sulfate 325 (65 FE) MG tablet Take 325 mg by mouth daily.      ibuprofen (ADVIL) 200 MG tablet Take 400 mg by mouth every 6 (six) hours as needed for headache or moderate pain.     metFORMIN (GLUCOPHAGE) 500 MG tablet Take 1 tablet (500 mg total) by mouth 2 (two) times daily with a meal. 60 tablet 1   ibuprofen (ADVIL) 800 MG tablet Take 1 tablet (800 mg total) by mouth 3 (three) times daily with meals. 30 tablet 0   No current facility-administered medications for this visit.    REVIEW OF SYSTEMS:   Constitutional: ( - ) fevers, ( - )  chills , ( - ) night sweats Eyes: ( - ) blurriness of vision, ( - ) double vision, ( - ) watery eyes Ears, nose, mouth, throat, and face: ( - ) mucositis, ( - ) sore throat Respiratory: ( - ) cough, ( - ) dyspnea, ( - ) wheezes Cardiovascular: ( - ) palpitation, ( - ) chest discomfort, ( - ) lower extremity swelling Gastrointestinal:  ( - ) nausea, ( - ) heartburn, ( - ) change in bowel habits Skin: ( - ) abnormal skin rashes Lymphatics: ( - ) new lymphadenopathy, ( - ) easy bruising Neurological: ( - ) numbness, ( - ) tingling, ( - ) new weaknesses Behavioral/Psych: ( - ) mood change, ( - ) new changes  All other systems were reviewed with the patient and are negative.  PHYSICAL EXAMINATION: ECOG PERFORMANCE STATUS: 1 - Symptomatic but completely ambulatory  Vitals:   01/22/21 1124  BP: (!) 153/68  Pulse: 78  Resp: 16  Temp: 97.9 F (36.6 C)  SpO2: 100%   Filed Weights   01/22/21 1124  Weight: 281 lb 8 oz (127.7 kg)    GENERAL: well appearing female in NAD  SKIN: skin color, texture, turgor are normal, no rashes or significant lesions EYES: conjunctiva are pink and non-injected, sclera clear OROPHARYNX: no exudate, no erythema; lips,  buccal mucosa, and tongue normal  NECK: supple, non-tender LYMPH:  no palpable lymphadenopathy in the cervical or supraclavicular lymph nodes.  LUNGS: clear to auscultation and percussion with normal breathing effort HEART: regular rate & rhythm and no murmurs and no lower extremity edema ABDOMEN: soft, non-tender, non-distended, normal bowel sounds Musculoskeletal: no cyanosis of digits and no clubbing  PSYCH: alert & oriented x 3, fluent speech NEURO: no focal motor/sensory  deficits  LABORATORY DATA:  I have reviewed the data as listed CBC Latest Ref Rng & Units 01/22/2021 01/18/2021 10/02/2020  WBC 4.0 - 10.5 K/uL 9.6 10.5 12.6(H)  Hemoglobin 12.0 - 15.0 g/dL 7.8(L) 7.7(L) 8.6(L)  Hematocrit 36.0 - 46.0 % 29.1(L) 30.0(L) 30.3(L)  Platelets 150 - 400 K/uL 412(H) 442(H) 370    CMP Latest Ref Rng & Units 01/18/2021 10/02/2020 10/01/2020  Glucose 70 - 99 mg/dL 108(H) 129(H) 132(H)  BUN 6 - 20 mg/dL 14 10 13   Creatinine 0.44 - 1.00 mg/dL 0.58 0.68 0.59  Sodium 135 - 145 mmol/L 136 134(L) 135  Potassium 3.5 - 5.1 mmol/L 4.1 3.7 3.9  Chloride 98 - 111 mmol/L 104 103 101  CO2 22 - 32 mmol/L 24 22 26   Calcium 8.9 - 10.3 mg/dL 8.8(L) 8.7(L) 9.0  Total Protein 6.5 - 8.1 g/dL 8.1 6.6 7.8  Total Bilirubin 0.3 - 1.2 mg/dL 0.3 0.5 0.4  Alkaline Phos 38 - 126 U/L 92 110 129(H)  AST 15 - 41 U/L 12(L) 16 16  ALT 0 - 44 U/L 11 11 11     RADIOGRAPHIC STUDIES: I have personally reviewed the radiological images as listed and agreed with the findings in the report. MR PELVIS W WO CONTRAST  Result Date: 01/16/2021 CLINICAL DATA:  Right adnexal mass and cervical mass on recent CT. EXAM: MRI PELVIS WITHOUT AND WITH CONTRAST TECHNIQUE: Multiplanar multisequence MR imaging of the pelvis was performed both before and after administration of intravenous contrast. CONTRAST:  39mL GADAVIST GADOBUTROL 1 MMOL/ML IV SOLN COMPARISON:  CT on 10/01/2020 FINDINGS: Lower Urinary Tract: No urinary bladder or urethral  abnormality identified. Bowel: Unremarkable pelvic bowel loops. Vascular/Lymphatic: Unremarkable. No pathologically enlarged pelvic lymph nodes identified. Reproductive: -- Uterus: Measures 9.6 x 5.0 by 5.6 cm (volume = 140 cm^3). No fibroids identified. Endometrium measures 9 mm in thickness and shows heterogeneous T2 signal intensity and mild contrast enhancement. A soft tissue mass is seen within the endocervical canal which measures 4.5 x 4.1 cm. This mass shows no definite evidence of cervical stromal involvement, but does involve the endometrium and myometrium in the lower uterine segment. Differential diagnosis includes prolapsing endometrial polyp or carcinoma, or primary cervical carcinoma. There is no evidence of parametrial or vaginal involvement. -- Right ovary: A large cystic and solid mass is seen in the right upper pelvis, arising from the right ovary. This has numerous enhancing mural soft tissue nodules and measures 13.0 x 10.1 by 10.5 cm. This is highly suspicious for ovarian carcinoma. -- Left ovary: Appears normal. No ovarian or adnexal masses identified. Other: No peritoneal thickening or abnormal free fluid. Musculoskeletal:  Unremarkable. IMPRESSION: 13.0 cm cystic and solid mass arising from the right ovary, highly suspicious for ovarian carcinoma. Mild endometrial thickening and 4.5 cm soft tissue mass within the endocervical canal. Differential diagnosis includes prolapsing endometrial polyp or carcinoma, or primary cervical carcinoma. No evidence of pelvic metastatic disease. Electronically Signed   By: Marlaine Hind M.D.   On: 01/16/2021 10:50   NM PET Image Initial (PI) Skull Base To Thigh (F-18 FDG)  Result Date: 01/22/2021 CLINICAL DATA:  Initial treatment strategy for uterine/cervical cancer. EXAM: NUCLEAR MEDICINE PET SKULL BASE TO THIGH TECHNIQUE: 14.0 mCi F-18 FDG was injected intravenously. Full-ring PET imaging was performed from the skull base to thigh after the radiotracer.  CT data was obtained and used for attenuation correction and anatomic localization. Fasting blood glucose: 105 mg/dl COMPARISON:  CT scan 10/01/2020 and MRI 01/15/2021 FINDINGS:  Mediastinal blood pool activity: SUV max 1.96 Liver activity: SUV max NA NECK: No hypermetabolic lymph nodes in the neck. Incidental CT findings: none CHEST: No hypermetabolic mediastinal or hilar nodes. No suspicious pulmonary nodules on the CT scan. Incidental CT findings: none ABDOMEN/PELVIS: The large (12 x 10 cm) cystic/necrotic appearing right adnexal mass demonstrates areas of marked hypermetabolism with SUV max of 17.69. Extensive hypermetabolic tumor involving the endometrium and cervix with SUV max of 22.79. Scattered borderline retroperitoneal lymph nodes are mildly hypermetabolic. 9 mm node along the anterior aspect of the IVC on image 136/4 has an SUV max of 4.05. Left-sided external iliac lymph node just lateral to the left ovary measures 10.5 mm and SUV max is 3.67 no evidence of omental disease or diffuse peritoneal carcinomatosis. Incidental CT findings: Moderate splenomegaly. SKELETON: No findings for osseous metastatic disease. Incidental CT findings: none IMPRESSION: 1. Large cystic/necrotic right adnexal mass is markedly hypermetabolic and consistent with known neoplasm. 2. Extensive hypermetabolic tumor involving the endometrium and cervix. 3. Scattered borderline enlarged and mildly hypermetabolic retroperitoneal and external iliac nodes. 4. No findings for metastatic disease involving the chest or bony structures. Electronically Signed   By: Marijo Sanes M.D.   On: 01/22/2021 16:36    ASSESSMENT & PLAN Alyssa Becker is a 26 y.o. female who presents for initial evaluation for iron deficiency anemia.  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 Patient is scheduled for surgery with Dr. Berline Lopes later this week on 01/24/21.   I reviewed labs from  today that shows persistent anemia with Hgb of 7.8. Iron panel shows persistent iron deficiency with ferritin 5, serum iron 15, iron saturation 4%. Likely cause of iron deficiency is heavy menstrual bleeding. Since patient has surgery later this week, we will bolster the levels to the best of her ability and plan to see the patient back after she is discharged from the hospital. She will proceed with a blood transfusion today and receive 2 units of pRBC.  We recommend IV iron therapy while she is in-house in order to help bolster her levels.  #Iron deficiency anemia 2/2 menstrual bleeding: --Labs today show Hgb of 7.8. Iron panel shows persistent iron deficiency with ferritin 5, serum iron 15, iron saturation 4%. --Currently on ferrous sulfate 325 mg once daily. --Patient will receive 2 units of PRBC today to improve Hgb levels before surgery.  --Recommend IV iron infusion while hospitalized  --RTC in 4 weeks with repeat labs.    No orders of the defined types were placed in this encounter.   All questions were answered. The patient knows to call the clinic with any problems, questions or concerns.  I have spent a total of 60 minutes minutes of face-to-face and non-face-to-face time, preparing to see the patient, obtaining and/or reviewing separately obtained history, performing a medically appropriate examination, counseling and educating the patient, ordering medications, documenting clinical information in the electronic health record, and care coordination.   Dede Query, PA-C Department of Hematology/Oncology [facility] Phone: [phone]

## 2021-01-22 NOTE — Patient Instructions (Signed)

## 2021-01-22 NOTE — Progress Notes (Signed)
Per [place], [place] OK to transfuse PRBCs at 336mL/hr today

## 2021-01-22 NOTE — Telephone Encounter (Signed)
Received call from pt mother this afternoon requesting her daughter's surgery be rescheduled from 01/24/2021 to a later date in February when the mother can be present. Joylene John, NP notified of request.

## 2021-01-23 ENCOUNTER — Telehealth: Payer: Self-pay | Admitting: Gynecologic Oncology

## 2021-01-23 ENCOUNTER — Telehealth: Payer: Self-pay | Admitting: Physician Assistant

## 2021-01-23 ENCOUNTER — Telehealth: Payer: Self-pay

## 2021-01-23 LAB — TYPE AND SCREEN
ABO/RH(D): A POS
Antibody Screen: POSITIVE
Donor AG Type: NEGATIVE
Donor AG Type: NEGATIVE
Unit division: 0
Unit division: 0

## 2021-01-23 LAB — BPAM RBC
Blood Product Expiration Date: 202302062359
Blood Product Expiration Date: 202302062359
ISSUE DATE / TIME: 202301031334
ISSUE DATE / TIME: 202301031516
Unit Type and Rh: 6200
Unit Type and Rh: 6200

## 2021-01-23 NOTE — Telephone Encounter (Signed)
Spoke with Alyssa Becker this afternoon.  Patient compliant with pre-operative instructions.  Reinforced nothing to eat after midnight. Clear liquids until 0430. Patient to arrive at [place].    Patient concerned about going home after the procedure due to having stairs to walk up to get into house. Advised patient to have someone with her when using stairs, go slowly, use the handrail and stop and rest if needed. Patient verbalized understanding.   No other questions or concerns voiced.  Instructed to call for any needs.

## 2021-01-23 NOTE — Telephone Encounter (Signed)
Attempted to reach patient to check in with her pre-operatively. Unable to contact patient and unable to leave voicemail.

## 2021-01-23 NOTE — Progress Notes (Signed)
Anesthesia Chart Review   Case: [id number] Date/Time: 01/24/21 0715   Procedures:      XI ROBOTIC ASSISTED SALPINGECTOMY;POSSIBLE STAGING; POSSIBLE LAPAROTOMY (Bilateral)     XI ROBOTIC ASSISTED OOPHORECTOMY, OOPHOROPEXY OF LEFT OVARY   Anesthesia type: General   Pre-op diagnosis: OVARIAN MASS AND CERVICAL CANCER   Location: [facility] ROOM 02 / [facility]   Surgeons: [name], MD       DISCUSSION:26 y.o. never smoker with h/o asthma, DM II (A1C 5.3), ovarian mass, cervical cancer, iron deficiency anemia scheduled for above procedure 01/24/2021 with Dr. Jeral Pinch.   Hemoglobin 7.8.  Seen by hematology 01/18/2021, received 2 units PRBC. Hem recommends IV iron infusion while hospitalized.   Anticipate pt can proceed with planned procedure barring acute status change.   VS: BP (!) 141/75    Pulse 79    Temp 36.8 C (Oral)    Resp 18    Ht 5\' 6"  (1.676 m)    Wt 127.5 kg    LMP 12/29/2020 (Approximate)    SpO2 100%    BMI 45.35 kg/m   PROVIDERS: Health, [facility] Personal   LABS: Labs reviewed: Acceptable for surgery. (all labs ordered are listed, but only abnormal results are displayed)  Labs Reviewed  CA 125 - Abnormal; Notable for the following components:      Result Value   Cancer Antigen (CA) 125 350.0 (*)    All other components within normal limits  CBC - Abnormal; Notable for the following components:   Hemoglobin 7.7 (*)    HCT 30.0 (*)    MCV 61.2 (*)    MCH 15.7 (*)    MCHC 25.7 (*)    RDW 21.7 (*)    Platelets 442 (*)    All other components within normal limits  COMPREHENSIVE METABOLIC PANEL - Abnormal; Notable for the following components:   Glucose, Bld 108 (*)    Calcium 8.8 (*)    AST 12 (*)    All other components within normal limits  GLUCOSE, CAPILLARY - Abnormal; Notable for the following components:   Glucose-Capillary 110 (*)    All other components within normal limits  HEMOGLOBIN A1C  TYPE AND SCREEN      IMAGES:   EKG: 01/18/2021 Rate 80 bpm  NSR  CV:  Past Medical History:  Diagnosis Date   Anemia    Anxiety    Asthma    Depression    Diabetes mellitus without complication (HCC)    type 2   Headache    migraine   Morbid obesity (HCC)    Ovarian mass     Past Surgical History:  Procedure Laterality Date   TONSILLECTOMY      MEDICATIONS:  acetaminophen (TYLENOL) 500 MG tablet   albuterol (VENTOLIN HFA) 108 (90 Base) MCG/ACT inhaler   Ascorbic Acid (VITAMIN C) 500 MG CHEW   ferrous sulfate 325 (65 FE) MG tablet   ibuprofen (ADVIL) 200 MG tablet   metFORMIN (GLUCOPHAGE) 500 MG tablet   No current facility-administered medications for this encounter.    Konrad Felix Ward, PA-C [facility] [phone]

## 2021-01-23 NOTE — Anesthesia Preprocedure Evaluation (Addendum)
Anesthesia Evaluation  Patient identified by MRN, date of birth, ID band Patient awake    Reviewed: Allergy & Precautions, NPO status , Patient's Chart, lab work & pertinent test results  Airway Mallampati: II  TM Distance: >3 FB Neck ROM: Full    Dental no notable dental hx. (+) Teeth Intact, Dental Advisory Given   Pulmonary asthma ,    Pulmonary exam normal breath sounds clear to auscultation       Cardiovascular negative cardio ROS Normal cardiovascular exam Rhythm:Regular Rate:Normal     Neuro/Psych  Headaches, PSYCHIATRIC DISORDERS Anxiety Depression    GI/Hepatic negative GI ROS, Neg liver ROS,   Endo/Other  diabetes, Type 2, Oral Hypoglycemic AgentsMorbid obesity (BMI 45)  Renal/GU negative Renal ROS  negative genitourinary   Musculoskeletal negative musculoskeletal ROS (+)   Abdominal   Peds  Hematology  (+) Blood dyscrasia, anemia , Lab Results      Component                Value               Date                      WBC                      9.6                 01/22/2021                HGB                      7.8 (L)             01/22/2021                HCT                      29.1 (L)            01/22/2021                MCV                      59.6 (L)            01/22/2021                PLT                      412 (H)             01/22/2021              Anesthesia Other Findings Received 2U RBCs yesterday, recheck H&H?  Reproductive/Obstetrics                            Anesthesia Physical Anesthesia Plan  ASA: 3  Anesthesia Plan: General   Post-op Pain Management: Tylenol PO (pre-op) and Ketamine IV   Induction: Intravenous  PONV Risk Score and Plan: 3 and Midazolam, Dexamethasone and Ondansetron  Airway Management Planned: Oral ETT  Additional Equipment:   Intra-op Plan:   Post-operative Plan: Extubation in OR  Informed Consent: I have reviewed the  patients History and Physical, chart, labs and discussed the procedure including the risks, benefits and alternatives for the proposed anesthesia with the patient or authorized  representative who has indicated his/her understanding and acceptance.     Dental advisory given  Plan Discussed with: CRNA  Anesthesia Plan Comments: (2 IVs)        Anesthesia Quick Evaluation

## 2021-01-23 NOTE — Telephone Encounter (Signed)
Called the patient, discussed PET scan findings. I still recommend surgery tomorrow to determine whether ovarian mass represents benign etiology (PID, torsion, etc) vs metastatic cervical cancer vs second primary cancer. All questions answered.  Jeral Pinch MD Gynecologic Oncology

## 2021-01-23 NOTE — Telephone Encounter (Signed)
Scheduled per 1/3 los, attempted to call pt but voicemail was full, will mail calender

## 2021-01-24 ENCOUNTER — Ambulatory Visit (HOSPITAL_COMMUNITY): Payer: Medicaid Other | Admitting: Certified Registered Nurse Anesthetist

## 2021-01-24 ENCOUNTER — Encounter (HOSPITAL_COMMUNITY)
Admission: RE | Disposition: A | Discharge status: Home / Self Care | Payer: Self-pay | Source: Home / Self Care | Attending: Gynecologic Oncology

## 2021-01-24 ENCOUNTER — Ambulatory Visit (HOSPITAL_COMMUNITY)
Admission: RE | Admit: 2021-01-24 | Discharge: 2021-01-25 | Disposition: A | Discharge status: Home / Self Care | Payer: Medicaid Other | Attending: Gynecologic Oncology | Admitting: Gynecologic Oncology

## 2021-01-24 ENCOUNTER — Encounter (HOSPITAL_COMMUNITY): Payer: Self-pay | Admitting: Gynecologic Oncology

## 2021-01-24 ENCOUNTER — Ambulatory Visit (HOSPITAL_COMMUNITY): Payer: Medicaid Other | Admitting: Emergency Medicine

## 2021-01-24 ENCOUNTER — Other Ambulatory Visit: Payer: Self-pay

## 2021-01-24 DIAGNOSIS — C541 Malignant neoplasm of endometrium: Secondary | ICD-10-CM | POA: Diagnosis not present

## 2021-01-24 DIAGNOSIS — N838 Other noninflammatory disorders of ovary, fallopian tube and broad ligament: Secondary | ICD-10-CM | POA: Diagnosis present

## 2021-01-24 DIAGNOSIS — Z7984 Long term (current) use of oral hypoglycemic drugs: Secondary | ICD-10-CM | POA: Diagnosis not present

## 2021-01-24 DIAGNOSIS — E119 Type 2 diabetes mellitus without complications: Secondary | ICD-10-CM | POA: Diagnosis not present

## 2021-01-24 DIAGNOSIS — C799 Secondary malignant neoplasm of unspecified site: Secondary | ICD-10-CM

## 2021-01-24 DIAGNOSIS — Z6841 Body Mass Index (BMI) 40.0 and over, adult: Secondary | ICD-10-CM | POA: Insufficient documentation

## 2021-01-24 DIAGNOSIS — Z01818 Encounter for other preprocedural examination: Secondary | ICD-10-CM

## 2021-01-24 DIAGNOSIS — C561 Malignant neoplasm of right ovary: Secondary | ICD-10-CM | POA: Diagnosis not present

## 2021-01-24 DIAGNOSIS — J45909 Unspecified asthma, uncomplicated: Secondary | ICD-10-CM | POA: Diagnosis not present

## 2021-01-24 DIAGNOSIS — C7982 Secondary malignant neoplasm of genital organs: Secondary | ICD-10-CM | POA: Diagnosis not present

## 2021-01-24 DIAGNOSIS — C539 Malignant neoplasm of cervix uteri, unspecified: Secondary | ICD-10-CM | POA: Diagnosis present

## 2021-01-24 HISTORY — PX: XI ROBOTIC ASSISTED SALPINGECTOMY: SHX6824

## 2021-01-24 HISTORY — PX: XI ROBOTIC ASSISTED OOPHORECTOMY: SHX6823

## 2021-01-24 LAB — CBC
HCT: 35.6 % — ABNORMAL LOW (ref 36.0–46.0)
Hemoglobin: 9.8 g/dL — ABNORMAL LOW (ref 12.0–15.0)
MCH: 18 pg — ABNORMAL LOW (ref 26.0–34.0)
MCHC: 27.5 g/dL — ABNORMAL LOW (ref 30.0–36.0)
MCV: 65.4 fL — ABNORMAL LOW (ref 80.0–100.0)
Platelets: 386 10*3/uL (ref 150–400)
RBC: 5.44 MIL/uL — ABNORMAL HIGH (ref 3.87–5.11)
RDW: 26.3 % — ABNORMAL HIGH (ref 11.5–15.5)
WBC: 11.2 10*3/uL — ABNORMAL HIGH (ref 4.0–10.5)
nRBC: 0 % (ref 0.0–0.2)

## 2021-01-24 LAB — TYPE AND SCREEN
ABO/RH(D): A POS
Antibody Screen: POSITIVE
PT AG Type: NEGATIVE
Unit division: 0
Unit division: 0

## 2021-01-24 LAB — GLUCOSE, CAPILLARY
Glucose-Capillary: 148 mg/dL — ABNORMAL HIGH (ref 70–99)
Glucose-Capillary: 147 mg/dL — ABNORMAL HIGH (ref 70–99)
Glucose-Capillary: 161 mg/dL — ABNORMAL HIGH (ref 70–99)
Glucose-Capillary: 288 mg/dL — ABNORMAL HIGH (ref 70–99)

## 2021-01-24 LAB — PREGNANCY, URINE: Preg Test, Ur: NEGATIVE

## 2021-01-24 LAB — BPAM RBC
Blood Product Expiration Date: 202302062359
Blood Product Expiration Date: 202302062359
ISSUE DATE / TIME: 202301031334
ISSUE DATE / TIME: 202301031516
Unit Type and Rh: 6200
Unit Type and Rh: 6200

## 2021-01-24 SURGERY — SALPINGECTOMY, ROBOT-ASSISTED
Anesthesia: General | Site: Pelvis

## 2021-01-24 MED ORDER — GABAPENTIN 300 MG PO CAPS
300.0000 mg | ORAL_CAPSULE | ORAL | Status: AC
  Administered 2021-01-24: 300 mg via ORAL
  Filled 2021-01-24: qty 1

## 2021-01-24 MED ORDER — METFORMIN HCL 500 MG PO TABS
500.0000 mg | ORAL_TABLET | Freq: Two times a day (BID) | ORAL | Status: DC
  Administered 2021-01-24 – 2021-01-25 (×2): 500 mg via ORAL
  Filled 2021-01-24 (×2): qty 1

## 2021-01-24 MED ORDER — SUGAMMADEX SODIUM 500 MG/5ML IV SOLN
INTRAVENOUS | Status: DC | PRN
  Administered 2021-01-24: 500 mg via INTRAVENOUS

## 2021-01-24 MED ORDER — PROPOFOL 10 MG/ML IV BOLUS
INTRAVENOUS | Status: DC | PRN
  Administered 2021-01-24: 200 mg via INTRAVENOUS

## 2021-01-24 MED ORDER — IBUPROFEN 400 MG PO TABS
800.0000 mg | ORAL_TABLET | Freq: Three times a day (TID) | ORAL | Status: DC
  Administered 2021-01-24 – 2021-01-25 (×3): 800 mg via ORAL
  Filled 2021-01-24 (×3): qty 2

## 2021-01-24 MED ORDER — CELECOXIB 200 MG PO CAPS
400.0000 mg | ORAL_CAPSULE | ORAL | Status: AC
  Administered 2021-01-24: 400 mg via ORAL
  Filled 2021-01-24: qty 2

## 2021-01-24 MED ORDER — BUPIVACAINE HCL 0.25 % IJ SOLN
INTRAMUSCULAR | Status: DC | PRN
  Administered 2021-01-24: 38 mL

## 2021-01-24 MED ORDER — FENTANYL CITRATE (PF) 100 MCG/2ML IJ SOLN
INTRAMUSCULAR | Status: AC
  Filled 2021-01-24: qty 2

## 2021-01-24 MED ORDER — ALBUTEROL SULFATE HFA 108 (90 BASE) MCG/ACT IN AERS
INHALATION_SPRAY | RESPIRATORY_TRACT | Status: DC | PRN
Start: 2021-01-24 — End: 2021-01-24
  Administered 2021-01-24: 2 via RESPIRATORY_TRACT

## 2021-01-24 MED ORDER — ONDANSETRON HCL 4 MG/2ML IJ SOLN: 4.0000 mg | Freq: Four times a day (QID) | INTRAMUSCULAR | Status: DC | PRN

## 2021-01-24 MED ORDER — LACTATED RINGERS IV SOLN: INTRAVENOUS | Status: DC

## 2021-01-24 MED ORDER — FENTANYL CITRATE PF 50 MCG/ML IJ SOSY: 25.0000 ug | PREFILLED_SYRINGE | INTRAMUSCULAR | Status: DC | PRN

## 2021-01-24 MED ORDER — ACETAMINOPHEN 500 MG PO TABS
1000.0000 mg | ORAL_TABLET | ORAL | Status: AC
  Administered 2021-01-24: 1000 mg via ORAL
  Filled 2021-01-24: qty 2

## 2021-01-24 MED ORDER — FENTANYL CITRATE (PF) 100 MCG/2ML IJ SOLN
INTRAMUSCULAR | Status: DC | PRN
  Administered 2021-01-24 (×8): 50 ug via INTRAVENOUS

## 2021-01-24 MED ORDER — MIDAZOLAM HCL 5 MG/5ML IJ SOLN
INTRAMUSCULAR | Status: DC | PRN
  Administered 2021-01-24: 2 mg via INTRAVENOUS

## 2021-01-24 MED ORDER — OXYCODONE HCL 5 MG PO TABS
ORAL_TABLET | ORAL | Status: AC
  Filled 2021-01-24: qty 1

## 2021-01-24 MED ORDER — SCOPOLAMINE 1 MG/3DAYS TD PT72
1.0000 | MEDICATED_PATCH | TRANSDERMAL | Status: DC
  Administered 2021-01-24: 1.5 mg via TRANSDERMAL
  Filled 2021-01-24: qty 1

## 2021-01-24 MED ORDER — PROPOFOL 10 MG/ML IV BOLUS
INTRAVENOUS | Status: AC
  Filled 2021-01-24: qty 20

## 2021-01-24 MED ORDER — OXYCODONE HCL 5 MG PO TABS
5.0000 mg | ORAL_TABLET | ORAL | Status: DC | PRN
  Administered 2021-01-24: 5 mg via ORAL

## 2021-01-24 MED ORDER — BUPIVACAINE-EPINEPHRINE (PF) 0.25% -1:200000 IJ SOLN
INTRAMUSCULAR | Status: AC
  Filled 2021-01-24: qty 30

## 2021-01-24 MED ORDER — ACETAMINOPHEN 500 MG PO TABS: 1000.0000 mg | ORAL_TABLET | Freq: Once | ORAL | Status: DC

## 2021-01-24 MED ORDER — LIDOCAINE HCL (PF) 2 % IJ SOLN
INTRAMUSCULAR | Status: AC
  Filled 2021-01-24: qty 5

## 2021-01-24 MED ORDER — ONDANSETRON HCL 4 MG/2ML IJ SOLN
INTRAMUSCULAR | Status: DC | PRN
Start: 2021-01-24 — End: 2021-01-24
  Administered 2021-01-24: 4 mg via INTRAVENOUS

## 2021-01-24 MED ORDER — PHENYLEPHRINE HCL-NACL 20-0.9 MG/250ML-% IV SOLN
INTRAVENOUS | Status: AC
  Filled 2021-01-24: qty 250

## 2021-01-24 MED ORDER — LACTATED RINGERS IV SOLN: INTRAVENOUS | Status: DC | PRN

## 2021-01-24 MED ORDER — KETAMINE HCL 50 MG/5ML IJ SOSY
PREFILLED_SYRINGE | INTRAMUSCULAR | Status: AC
  Filled 2021-01-24: qty 5

## 2021-01-24 MED ORDER — HYDROMORPHONE HCL 1 MG/ML IJ SOLN
INTRAMUSCULAR | Status: AC
  Filled 2021-01-24: qty 1

## 2021-01-24 MED ORDER — DEXTROSE 5 % IV SOLN
2.0000 g | Freq: Once | INTRAVENOUS | Status: AC
  Administered 2021-01-24: 2 g via INTRAVENOUS
  Filled 2021-01-24: qty 2

## 2021-01-24 MED ORDER — CHLORHEXIDINE GLUCONATE 0.12 % MT SOLN: 15.0000 mL | Freq: Once | OROMUCOSAL | Status: AC

## 2021-01-24 MED ORDER — DEXAMETHASONE SODIUM PHOSPHATE 10 MG/ML IJ SOLN
INTRAMUSCULAR | Status: DC | PRN
  Administered 2021-01-24: 6 mg via INTRAVENOUS

## 2021-01-24 MED ORDER — LACTATED RINGERS IR SOLN
Status: DC | PRN
  Administered 2021-01-24: 1

## 2021-01-24 MED ORDER — ALBUTEROL SULFATE (2.5 MG/3ML) 0.083% IN NEBU: 2.5000 mg | INHALATION_SOLUTION | Freq: Four times a day (QID) | RESPIRATORY_TRACT | Status: DC | PRN

## 2021-01-24 MED ORDER — MIDAZOLAM HCL 2 MG/2ML IJ SOLN
INTRAMUSCULAR | Status: AC
  Filled 2021-01-24: qty 2

## 2021-01-24 MED ORDER — ROCURONIUM BROMIDE 10 MG/ML (PF) SYRINGE
PREFILLED_SYRINGE | INTRAVENOUS | Status: AC
  Filled 2021-01-24: qty 10

## 2021-01-24 MED ORDER — ONDANSETRON HCL 4 MG/2ML IJ SOLN
INTRAMUSCULAR | Status: AC
  Filled 2021-01-24: qty 2

## 2021-01-24 MED ORDER — ALBUTEROL SULFATE HFA 108 (90 BASE) MCG/ACT IN AERS
INHALATION_SPRAY | RESPIRATORY_TRACT | Status: AC
  Filled 2021-01-24: qty 6.7

## 2021-01-24 MED ORDER — PHENYLEPHRINE HCL-NACL 20-0.9 MG/250ML-% IV SOLN
INTRAVENOUS | Status: DC | PRN
  Administered 2021-01-24: 50 ug/min via INTRAVENOUS

## 2021-01-24 MED ORDER — BUPIVACAINE HCL 0.25 % IJ SOLN
INTRAMUSCULAR | Status: AC
  Filled 2021-01-24: qty 1

## 2021-01-24 MED ORDER — HEPARIN SODIUM (PORCINE) 5000 UNIT/ML IJ SOLN
5000.0000 [IU] | INTRAMUSCULAR | Status: AC
  Administered 2021-01-24: 5000 [IU] via SUBCUTANEOUS
  Filled 2021-01-24: qty 1

## 2021-01-24 MED ORDER — FERROUS SULFATE 325 (65 FE) MG PO TABS
325.0000 mg | ORAL_TABLET | Freq: Every day | ORAL | Status: DC
  Administered 2021-01-24 – 2021-01-25 (×2): 325 mg via ORAL
  Filled 2021-01-24 (×2): qty 1

## 2021-01-24 MED ORDER — DEXMEDETOMIDINE (PRECEDEX) IN NS 20 MCG/5ML (4 MCG/ML) IV SYRINGE
PREFILLED_SYRINGE | INTRAVENOUS | Status: DC | PRN
  Administered 2021-01-24 (×2): 8 ug via INTRAVENOUS

## 2021-01-24 MED ORDER — LIDOCAINE HCL (PF) 2 % IJ SOLN
INTRAMUSCULAR | Status: DC | PRN
  Administered 2021-01-24: 1.5 mg/kg/h via INTRADERMAL

## 2021-01-24 MED ORDER — STERILE WATER FOR IRRIGATION IR SOLN
Status: DC | PRN
  Administered 2021-01-24: 1000 mL

## 2021-01-24 MED ORDER — ROCURONIUM BROMIDE 10 MG/ML (PF) SYRINGE
PREFILLED_SYRINGE | INTRAVENOUS | Status: DC | PRN
  Administered 2021-01-24: 30 mg via INTRAVENOUS
  Administered 2021-01-24 (×2): 20 mg via INTRAVENOUS
  Administered 2021-01-24: 100 mg via INTRAVENOUS

## 2021-01-24 MED ORDER — ENOXAPARIN SODIUM 40 MG/0.4ML IJ SOSY
40.0000 mg | PREFILLED_SYRINGE | INTRAMUSCULAR | Status: DC
  Administered 2021-01-25: 40 mg via SUBCUTANEOUS
  Filled 2021-01-24: qty 0.4

## 2021-01-24 MED ORDER — DEXAMETHASONE SODIUM PHOSPHATE 10 MG/ML IJ SOLN
INTRAMUSCULAR | Status: AC
  Filled 2021-01-24: qty 1

## 2021-01-24 MED ORDER — ONDANSETRON HCL 4 MG PO TABS: 4.0000 mg | ORAL_TABLET | Freq: Four times a day (QID) | ORAL | Status: DC | PRN

## 2021-01-24 MED ORDER — LIDOCAINE 2% (20 MG/ML) 5 ML SYRINGE
INTRAMUSCULAR | Status: DC | PRN
  Administered 2021-01-24: 80 mg via INTRAVENOUS

## 2021-01-24 MED ORDER — METRONIDAZOLE 500 MG/100ML IV SOLN
500.0000 mg | Freq: Once | INTRAVENOUS | Status: AC
Start: 2021-01-24 — End: 2021-01-24
  Administered 2021-01-24: 500 mg via INTRAVENOUS
  Filled 2021-01-24: qty 100

## 2021-01-24 MED ORDER — KCL IN DEXTROSE-NACL 20-5-0.45 MEQ/L-%-% IV SOLN
INTRAVENOUS | Status: DC
  Filled 2021-01-24: qty 1000

## 2021-01-24 MED ORDER — METRONIDAZOLE 500 MG/100ML IV SOLN
INTRAVENOUS | Status: AC
  Filled 2021-01-24: qty 100

## 2021-01-24 MED ORDER — TRAMADOL HCL 50 MG PO TABS: 50.0000 mg | ORAL_TABLET | Freq: Four times a day (QID) | ORAL | Status: DC | PRN

## 2021-01-24 MED ORDER — KETAMINE HCL 10 MG/ML IJ SOLN
INTRAMUSCULAR | Status: DC | PRN
  Administered 2021-01-24: 10 mg via INTRAVENOUS
  Administered 2021-01-24: 30 mg via INTRAVENOUS
  Administered 2021-01-24: 10 mg via INTRAVENOUS

## 2021-01-24 MED ORDER — PHENYLEPHRINE 40 MCG/ML (10ML) SYRINGE FOR IV PUSH (FOR BLOOD PRESSURE SUPPORT)
PREFILLED_SYRINGE | INTRAVENOUS | Status: DC | PRN
  Administered 2021-01-24 (×3): 80 ug via INTRAVENOUS

## 2021-01-24 MED ORDER — HEMOSTATIC AGENTS (NO CHARGE) OPTIME
TOPICAL | Status: DC | PRN
  Administered 2021-01-24 (×2): 1 via TOPICAL

## 2021-01-24 MED ORDER — SUGAMMADEX SODIUM 500 MG/5ML IV SOLN
INTRAVENOUS | Status: AC
  Filled 2021-01-24: qty 5

## 2021-01-24 MED ORDER — DEXMEDETOMIDINE (PRECEDEX) IN NS 20 MCG/5ML (4 MCG/ML) IV SYRINGE
PREFILLED_SYRINGE | INTRAVENOUS | Status: AC
  Filled 2021-01-24: qty 5

## 2021-01-24 MED ORDER — ACETAMINOPHEN 500 MG PO TABS: 1000.0000 mg | ORAL_TABLET | Freq: Four times a day (QID) | ORAL | Status: DC | PRN

## 2021-01-24 MED ORDER — PHENYLEPHRINE 40 MCG/ML (10ML) SYRINGE FOR IV PUSH (FOR BLOOD PRESSURE SUPPORT)
PREFILLED_SYRINGE | INTRAVENOUS | Status: AC
  Filled 2021-01-24: qty 10

## 2021-01-24 MED ORDER — DEXAMETHASONE SODIUM PHOSPHATE 4 MG/ML IJ SOLN: 4.0000 mg | INTRAMUSCULAR | Status: DC

## 2021-01-24 MED ORDER — ORAL CARE MOUTH RINSE
15.0000 mL | Freq: Once | OROMUCOSAL | Status: AC
  Administered 2021-01-24: 15 mL via OROMUCOSAL

## 2021-01-24 MED ORDER — HYDROMORPHONE HCL 1 MG/ML IJ SOLN
0.2000 mg | INTRAMUSCULAR | Status: DC | PRN
  Administered 2021-01-24: 0.5 mg via INTRAVENOUS

## 2021-01-24 SURGICAL SUPPLY — 71 items
APPLICATOR SURGIFLO ENDO (HEMOSTASIS) ×1 IMPLANT
APPLIER CLIP ROT 10 11.4 M/L (STAPLE) ×3
BAG COUNTER SPONGE SURGICOUNT (BAG) IMPLANT
BAG LAPAROSCOPIC 12 15 PORT 16 (BASKET) IMPLANT
BAG RETRIEVAL 12/15 (BASKET) ×3
BLADE SURG SZ10 CARB STEEL (BLADE) IMPLANT
CLIP APPLIE ROT 10 11.4 M/L (STAPLE) IMPLANT
COVER BACK TABLE 60X90IN (DRAPES) ×2 IMPLANT
COVER TIP SHEARS 8 DVNC (MISCELLANEOUS) ×2 IMPLANT
COVER TIP SHEARS 8MM DA VINCI (MISCELLANEOUS) ×1
DECANTER SPIKE VIAL GLASS SM (MISCELLANEOUS) ×3 IMPLANT
DERMABOND ADVANCED (GAUZE/BANDAGES/DRESSINGS) ×1
DERMABOND ADVANCED .7 DNX12 (GAUZE/BANDAGES/DRESSINGS) ×2 IMPLANT
DRAPE ARM DVNC X/XI (DISPOSABLE) ×8 IMPLANT
DRAPE COLUMN DVNC XI (DISPOSABLE) ×2 IMPLANT
DRAPE DA VINCI XI ARM (DISPOSABLE) ×4
DRAPE DA VINCI XI COLUMN (DISPOSABLE) ×1
DRAPE SHEET LG 3/4 BI-LAMINATE (DRAPES) ×3 IMPLANT
DRAPE SURG IRRIG POUCH 19X23 (DRAPES) ×3 IMPLANT
DRSG OPSITE POSTOP 4X6 (GAUZE/BANDAGES/DRESSINGS) IMPLANT
DRSG OPSITE POSTOP 4X8 (GAUZE/BANDAGES/DRESSINGS) IMPLANT
ELECT PENCIL ROCKER SW 15FT (MISCELLANEOUS) IMPLANT
ELECT REM PT RETURN 15FT ADLT (MISCELLANEOUS) ×3 IMPLANT
GAUZE 4X4 16PLY ~~LOC~~+RFID DBL (SPONGE) ×6 IMPLANT
GLOVE SURG ENC MOIS LTX SZ6 (GLOVE) ×12 IMPLANT
GLOVE SURG ENC MOIS LTX SZ6.5 (GLOVE) ×6 IMPLANT
GOWN STRL REUS W/ TWL LRG LVL3 (GOWN DISPOSABLE) ×8 IMPLANT
GOWN STRL REUS W/TWL LRG LVL3 (GOWN DISPOSABLE) ×4
HOLDER FOLEY CATH W/STRAP (MISCELLANEOUS) IMPLANT
IRRIG SUCT STRYKERFLOW 2 WTIP (MISCELLANEOUS) ×3
IRRIGATION SUCT STRKRFLW 2 WTP (MISCELLANEOUS) ×2 IMPLANT
IV LACTATED RINGERS 1000ML (IV SOLUTION) ×1 IMPLANT
KIT PROCEDURE DA VINCI SI (MISCELLANEOUS)
KIT PROCEDURE DVNC SI (MISCELLANEOUS) IMPLANT
KIT TURNOVER KIT A (KITS) ×1 IMPLANT
MANIPULATOR ADVINCU DEL 3.0 PL (MISCELLANEOUS) IMPLANT
MANIPULATOR ADVINCU DEL 3.5 PL (MISCELLANEOUS) IMPLANT
MANIPULATOR UTERINE 4.5 ZUMI (MISCELLANEOUS) IMPLANT
NDL HYPO 21X1.5 SAFETY (NEEDLE) ×2 IMPLANT
NDL SPNL 18GX3.5 QUINCKE PK (NEEDLE) IMPLANT
NEEDLE HYPO 21X1.5 SAFETY (NEEDLE) ×3 IMPLANT
NEEDLE SPNL 18GX3.5 QUINCKE PK (NEEDLE) IMPLANT
OBTURATOR OPTICAL STANDARD 8MM (TROCAR) ×1
OBTURATOR OPTICAL STND 8 DVNC (TROCAR) ×2
OBTURATOR OPTICALSTD 8 DVNC (TROCAR) ×2 IMPLANT
PACK ROBOT GYN CUSTOM WL (TRAY / TRAY PROCEDURE) ×3 IMPLANT
PAD POSITIONING PINK XL (MISCELLANEOUS) ×3 IMPLANT
PORT ACCESS TROCAR AIRSEAL 12 (TROCAR) ×2 IMPLANT
PORT ACCESS TROCAR AIRSEAL 5M (TROCAR) ×1
POUCH SPECIMEN RETRIEVAL 10MM (ENDOMECHANICALS) IMPLANT
SCRUB EXIDINE 4% CHG 4OZ (MISCELLANEOUS) ×3 IMPLANT
SEAL CANN UNIV 5-8 DVNC XI (MISCELLANEOUS) ×8 IMPLANT
SEAL XI 5MM-8MM UNIVERSAL (MISCELLANEOUS) ×4
SET TRI-LUMEN FLTR TB AIRSEAL (TUBING) ×3 IMPLANT
SPONGE T-LAP 18X18 ~~LOC~~+RFID (SPONGE) IMPLANT
SURGIFLO W/THROMBIN 8M KIT (HEMOSTASIS) ×2 IMPLANT
SUT MNCRL AB 4-0 PS2 18 (SUTURE) IMPLANT
SUT PDS AB 1 TP1 96 (SUTURE) IMPLANT
SUT VIC AB 0 CT1 27 (SUTURE)
SUT VIC AB 0 CT1 27XBRD ANTBC (SUTURE) IMPLANT
SUT VIC AB 2-0 CT1 27 (SUTURE)
SUT VIC AB 2-0 CT1 TAPERPNT 27 (SUTURE) IMPLANT
SUT VIC AB 4-0 PS2 18 (SUTURE) ×6 IMPLANT
SYR 10ML LL (SYRINGE) IMPLANT
TOWEL OR NON WOVEN STRL DISP B (DISPOSABLE) IMPLANT
TRAP SPECIMEN MUCUS 40CC (MISCELLANEOUS) ×2 IMPLANT
TRAY FOLEY MTR SLVR 16FR STAT (SET/KITS/TRAYS/PACK) ×3 IMPLANT
TROCAR XCEL NON-BLD 5MMX100MML (ENDOMECHANICALS) ×2 IMPLANT
UNDERPAD 30X36 HEAVY ABSORB (UNDERPADS AND DIAPERS) ×6 IMPLANT
WATER STERILE IRR 1000ML POUR (IV SOLUTION) ×3 IMPLANT
YANKAUER SUCT BULB TIP 10FT TU (MISCELLANEOUS) IMPLANT

## 2021-01-24 NOTE — Anesthesia Procedure Notes (Addendum)
Procedure Name: Intubation Date/Time: 01/24/2021 7:47 AM Performed by: West Pugh, CRNA Pre-anesthesia Checklist: Patient identified, Emergency Drugs available, Suction available, Patient being monitored and Timeout performed Patient Re-evaluated:Patient Re-evaluated prior to induction Oxygen Delivery Method: Circle system utilized Preoxygenation: Pre-oxygenation with 100% oxygen Induction Type: IV induction Ventilation: Mask ventilation without difficulty Laryngoscope Size: Mac and 3 Grade View: Grade I Tube type: Oral Tube size: 7.5 mm Number of attempts: 1 Airway Equipment and Method: Stylet Placement Confirmation: ETT inserted through vocal cords under direct vision, positive ETCO2, CO2 detector and breath sounds checked- equal and bilateral Secured at: 21 cm Tube secured with: Tape Dental Injury: Teeth and Oropharynx as per pre-operative assessment

## 2021-01-24 NOTE — Progress Notes (Signed)
Patient's mother called my office after the patient's surgery this morning to inquire about how surgery went.  I called her to let her know surgery went well and that the patient was in recovery.  I do not feel comfortable sharing additional details as the patient did not give me permission to do this before surgery.  I will talk with the patient when she is in the room and awakened from anesthesia.  If I have the patient's permission to call her back to share additional details about surgery, I told her I would do that.  She was somewhat upset with me stating that she used to work in a hospital and knows that it is HIPAA policy but this is her daughter and she cannot be here with her for surgery.  I have voiced my understanding that this must be a difficult situation for her, but I have to respect the patient's wishes.  Jeral Pinch MD Gynecologic Oncology

## 2021-01-24 NOTE — Anesthesia Postprocedure Evaluation (Signed)
Anesthesia Post Note  Patient: Alyssa Becker  Procedure(s) Performed: XI ROBOTIC ASSISTED SALPINGECTOMY; RIGHT URETERAL LYSIS; LYSIS OF ADHESIONS (Bilateral: Pelvis) XI ROBOTIC ASSISTED RIGHT OOPHORECTOMY, OOPHOROPEXY OF LEFT OVARY (Pelvis)     Patient location during evaluation: PACU Anesthesia Type: General Level of consciousness: awake and alert Pain management: pain level controlled Vital Signs Assessment: post-procedure vital signs reviewed and stable Respiratory status: spontaneous breathing, nonlabored ventilation, respiratory function stable and patient connected to nasal cannula oxygen Cardiovascular status: blood pressure returned to baseline and stable Postop Assessment: no apparent nausea or vomiting Anesthetic complications: no   No notable events documented.  Last Vitals:  Vitals:   01/24/21 1300 01/24/21 1315  BP: (!) 148/68 138/75  Pulse: 81 81  Resp: 15 14  Temp:  36.6 C  SpO2: 96% 96%    Last Pain:  Vitals:   01/24/21 1400  TempSrc:   PainSc: 7                  Tamatha Gadbois L Hadlea Furuya

## 2021-01-24 NOTE — Brief Op Note (Signed)
01/24/2021  12:07 PM  PATIENT:  Alyssa Becker  26 y.o. female  PRE-OPERATIVE DIAGNOSIS:  OVARIAN MASS AND CERVICAL CANCER  POST-OPERATIVE DIAGNOSIS:  OVARIAN MASS AND CERVICAL CANCER  PROCEDURE:  Procedure(s): XI ROBOTIC ASSISTED SALPINGECTOMY; RIGHT URETERAL LYSIS; LYSIS OF ADHESIONS (Bilateral) XI ROBOTIC ASSISTED RIGHT OOPHORECTOMY, OOPHOROPEXY OF LEFT OVARY (N/A)  SURGEON:  Surgeon(s) and Role:    [name], MD - Primary  ASSISTANTS: Melissa Cross  EBL:  300 mL  (100cc from cervix after vaginal prep, 200cc from surgery)  BLOOD ADMINISTERED:none  DRAINS: none   LOCAL MEDICATIONS USED:  MARCAINE     SPECIMEN:  right tube and ovary, left tube  DISPOSITION OF SPECIMEN:  PATHOLOGY  COUNTS:  YES  TOURNIQUET:  * No tourniquets in log *  DICTATION: .Note written in EPIC  PLAN OF CARE: Admit for overnight observation  PATIENT DISPOSITION:  PACU - hemodynamically stable.   Delay start of Pharmacological VTE agent (>24hrs) due to surgical blood loss or risk of bleeding: no

## 2021-01-24 NOTE — Op Note (Signed)
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  No definitive parametrial involvement appreciated.  Even gentle exam because significant bleeding so vagina was packed with lap sponges for the remainder of surgery.  On intra-abdominal exam, normal upper abdominal survey including omentum, liver edge, diaphragm, and stomach.  Normal small bowel.  Some physiologic adhesions of the sigmoid epiploica to the left sidewall.  Normal-appearing left ovary measuring approximately 3-4 cm.  Normal-appearing bilateral fallopian tubes.  Uterus approximately 8-10 cm and overall normal in appearance.  Right ovary replaced by a smooth but vascular 10 cm mass with multiple loculations.  Ovary with dense adhesions to the right pelvic sidewall.  Upon very careful manipulation of the mass for its removal, there was some drainage of fluid noted that was somewhat purulent.  This was sent for culture and Gram stain.  Upon contained mass removal with fractionation of the mass, tumor versus necrotic material was noted.  Frozen section consistent with high-grade carcinoma, unable to differentiate on frozen section whether this is the same cancer being seen on her cervical biopsies.  Right ureterolysis performed given oozing services from the broad ligament and peritoneum where adhesions were lysed between the ovary and peritoneum, to assure  ureter not in close proximity.  No obvious adenopathy.  No ascites.  Left ovary transposed out of the pelvis along the left gutter, clips placed along the sidewall as well as the superior and inferior aspect of the ovary.  Estimated Blood Loss:  300cc -200 from the surgery itself and 100 from manipulation of the cervix at the start of the procedure      Total IV Fluids: See I&O flowsheet         Specimens: bilateral tubes, right ovary, pelvic washings         Complications:  None apparent; patient tolerated the procedure well.         Disposition: PACU - hemodynamically stable.  Procedure Details  The patient was seen in the Holding Room. The risks, benefits, complications, treatment options, and expected outcomes were discussed with the patient.  The patient concurred with the proposed plan, giving informed consent.  The site of surgery properly noted/marked. The patient was identified as Alyssa Becker and the procedure verified as a Robotic-assisted bilateral salpingectomy, unilateral oophorectomy, ovarian transposition of contralateral ovary, and any other indicated procedures.   After induction of anesthesia, the patient was draped and prepped in the usual sterile manner. Patient was placed in supine position after anesthesia and draped and prepped in the usual sterile manner as follows: Her arms were tucked to her side with all appropriate precautions.  The shoulders were stabilized with padded shoulder blocks applied to the acromium processes.  The patient was placed in the semi-lithotomy position in [place].  The perineum and vagina were prepped with CholoraPrep. The patient was draped after the CholoraPrep had been allowed to dry for 3 minutes.  A Time Out was held and the above information confirmed.  The urethra was prepped with Betadine. Foley catheter was placed.  Vagina was packed  with sponges given bleeding after bimanual and rectovaginal exam.  OG tube placement was  confirmed and to suction.   Next, a 10 mm skin incision was made 1 cm below the subcostal margin in the midclavicular line.  The 5 mm Optiview port and scope was used for direct entry.  Opening pressure was under 10 mm CO2.  The abdomen was insufflated and the findings were noted as above.   At this point and all points during the procedure, the patient's intra-abdominal pressure did not exceed 15 mmHg. Next, an 8 mm skin incision was made superior the umbilicus and a right and left port were placed about 8 cm lateral to the robot port on the right and left side.  A fourth arm was placed on the right.  The 5 mm assist trocar was exchanged for a 10-12 mm port. All ports were placed under direct visualization.  The patient was placed in steep Trendelenburg. The robot was docked in the normal manner.  The right peritoneum was opened parallel to the IP ligament to open the retroperitoneal space. The round ligaments were preserved. The ureter was noted to be on the medial leaf of the broad ligament.  The peritoneum above the ureter was incised and stretched and the infundibulopelvic ligament was skeletonized, cauterized and cut.  The utero-ovarian ligament and fallopian tube were skeletonized, cauterized and transected just lateral to the uterine fundus.  Given significant adhesions of the ovary to the medial leaf of the broad ligament and sidewall, care was used to manipulate the ovarian mass to safely incise the peritoneum along the medial leaf of the broad ligament.  During this gentle manipulation, there was unavoidable rupture of a small component of the cystic lesion with what appeared to be purulent fluid.  This was collected to be sent for culture and gram stain.  Additional manipulation allowed ultimately the remaining fibrous attachments of the adnexa to be identified, skeletonized, cauterized, and transected, freeing the adnexa.  The right adnexa was placed in an Endo Catch bag.    Instruments were  removed from the abdomen.  The camera was left in the abdomen.  The Endo Catch bag was brought through the assist trocar.  Contained morcellation of the mass was performed with no additional fluid that appeared to be purulent although necrotic versus tumor filled cystic lesion noted, removed in piecemeal form from the bag.  Once the bag had been decompressed and the ovary removed, this was found to pathology for frozen section.  Under direct visualization, the assist trocar was replaced and robotic instruments were replaced into the abdomen.  Bleeding was noted from the surgical bed and pressure was held using a Ray-Tec sponge.  Attention was then turned to the left.  The adhesions of the sigmoid epiploica to the left sidewall and left IP ligament were then lysed sharply and with short bursts of electrocautery.  The colon was mobilized some along the white line of Toldt.  The fallopian tube was elevated and the peritoneum opened parallel to the IP ligament, opening the retroperitoneal space.  The round ligament was preserved.  The ureter was noted to be on the medial leaf of the broad ligament.  The peritoneum above the ureter was incised.  The utero-ovarian ligament and fallopian tube were isolated, skeletonized, cauterized, and transected, just lateral to the uterus.  The fallopian tube was then elevated and bipolar monopolar electrocautery were used to cauterize and transect the fallopian tube along the mesosalpinx, freeing it from the  left ovary.  The fallopian tube was handed off the field to be sent to pathology.  At this point, frozen section returned showing high-grade carcinoma, unable to determine whether this is related to her cervical cancer or a second primary.  Given our discussion and the patient's desire to preserve and ovary if possible, I made the decision to preserve the ovary and pexed it out of the pelvis.  After assuring sufficient mobility along the IP ligament, the ovary was secured to  the left abdominal sidewall within the paracolic gutter with 2 figure-of-eight sutures using 2-0 Vicryl.  Clips were then placed along the superior, lateral, and inferior aspect of the ovary to help delineate it for future treatment purposes.  Pelvis was then copiously irrigated.  Given bleeding still noted from the surgical bed along the medial leaf of the broad ligament and within the cul-de-sac, ureterolysis was performed on the right to below the level of the resection bed, the ureter was freed 360.  Bipolar electrocautery was then used to achieve hemostasis of the peritoneal areas along the surgical bed that were bleeding.  After hemostasis was noted, irrigation was again performed.  Floseal was placed within the surgical bed.  At this point in the procedure was completed.  Robotic instruments were removed under direct visulaization.  The robot was undocked.  Trocar sites were irrigated.  The fascia at the 10-12 mm port was closed with 0 Vicryl on a UR-5 needle.  The subcuticular tissue was closed with 4-0 Vicryl and the skin was closed with 4-0 Monocryl in a subcuticular manner.  Dermabond was applied.    The vagina was swabbed after packing removed with minimal bleeding noted. Foley catheter was removed.  All sponge, lap and needle counts were correct x  3.   The patient was transferred to the recovery room in stable condition.  Jeral Pinch, MD

## 2021-01-24 NOTE — Transfer of Care (Signed)
Immediate Anesthesia Transfer of Care Note  Patient: Alyssa Becker  Procedure(s) Performed: XI ROBOTIC ASSISTED SALPINGECTOMY; RIGHT URETERAL LYSIS; LYSIS OF ADHESIONS (Bilateral: Pelvis) XI ROBOTIC ASSISTED RIGHT OOPHORECTOMY, OOPHOROPEXY OF LEFT OVARY (Pelvis)  Patient Location: PACU  Anesthesia Type:General  Level of Consciousness: awake, drowsy and patient cooperative  Airway & Oxygen Therapy: Patient Spontanous Breathing and Patient connected to face mask oxygen  Post-op Assessment: Report given to RN and Post -op Vital signs reviewed and stable  Post vital signs: Reviewed and stable  Last Vitals:  Vitals Value Taken Time  BP 160/85 01/24/21 1208  Temp    Pulse 98 01/24/21 1211  Resp 21 01/24/21 1211  SpO2 100 % 01/24/21 1211  Vitals shown include unvalidated device data.  Last Pain:  Vitals:   01/24/21 0551  TempSrc: Oral         Complications: No notable events documented.

## 2021-01-24 NOTE — Progress Notes (Signed)
I spoke with the patient in the recovery room to let her know findings from surgery as well as frozen pathology.  All questions answered.  I discussed with her speaking with her mother about results.  She gave me permission to do this.  I called her mother and reviewed what was done during surgery, intraoperative findings, and frozen section.  Jeral Pinch MD Gynecologic Oncology

## 2021-01-24 NOTE — Interval H&P Note (Signed)
History and Physical Interval Note:  01/24/2021 7:04 AM  Alyssa Becker  has presented today for surgery, with the diagnosis of OVARIAN MASS AND CERVICAL CANCER.  The various methods of treatment have been discussed with the patient and family. After consideration of risks, benefits and other options for treatment, the patient has consented to  Procedure(s): XI ROBOTIC ASSISTED SALPINGECTOMY;POSSIBLE STAGING; POSSIBLE LAPAROTOMY (Bilateral) XI ROBOTIC ASSISTED OOPHORECTOMY, OOPHOROPEXY OF LEFT OVARY (N/A) as a surgical intervention.  The patient's history has been reviewed, patient examined, no change in status, stable for surgery.  I have reviewed the patient's chart and labs.  Questions were answered to the patient's satisfaction.     [name]

## 2021-01-25 ENCOUNTER — Encounter (HOSPITAL_COMMUNITY): Payer: Self-pay | Admitting: Gynecologic Oncology

## 2021-01-25 DIAGNOSIS — C541 Malignant neoplasm of endometrium: Secondary | ICD-10-CM | POA: Diagnosis not present

## 2021-01-25 LAB — CBC WITH DIFFERENTIAL/PLATELET
Abs Immature Granulocytes: 0.08 10*3/uL — ABNORMAL HIGH (ref 0.00–0.07)
Basophils Absolute: 0 10*3/uL (ref 0.0–0.1)
Basophils Relative: 0 %
Eosinophils Absolute: 0 10*3/uL (ref 0.0–0.5)
Eosinophils Relative: 0 %
HCT: 34.8 % — ABNORMAL LOW (ref 36.0–46.0)
Hemoglobin: 9.3 g/dL — ABNORMAL LOW (ref 12.0–15.0)
Immature Granulocytes: 1 %
Lymphocytes Relative: 9 %
Lymphs Abs: 1.2 10*3/uL (ref 0.7–4.0)
MCH: 17.8 pg — ABNORMAL LOW (ref 26.0–34.0)
MCHC: 26.7 g/dL — ABNORMAL LOW (ref 30.0–36.0)
MCV: 66.5 fL — ABNORMAL LOW (ref 80.0–100.0)
Monocytes Absolute: 0.8 10*3/uL (ref 0.1–1.0)
Monocytes Relative: 6 %
Neutro Abs: 12.1 10*3/uL — ABNORMAL HIGH (ref 1.7–7.7)
Neutrophils Relative %: 84 %
Platelets: 408 10*3/uL — ABNORMAL HIGH (ref 150–400)
RBC: 5.23 MIL/uL — ABNORMAL HIGH (ref 3.87–5.11)
RDW: 26 % — ABNORMAL HIGH (ref 11.5–15.5)
WBC: 14.2 10*3/uL — ABNORMAL HIGH (ref 4.0–10.5)
nRBC: 0 % (ref 0.0–0.2)

## 2021-01-25 LAB — BASIC METABOLIC PANEL
Anion gap: 11 (ref 5–15)
BUN: 12 mg/dL (ref 6–20)
CO2: 24 mmol/L (ref 22–32)
Calcium: 9.4 mg/dL (ref 8.9–10.3)
Chloride: 105 mmol/L (ref 98–111)
Creatinine, Ser: 0.59 mg/dL (ref 0.44–1.00)
GFR, Estimated: >60 mL/min (ref >60)
Glucose, Bld: 108 mg/dL — ABNORMAL HIGH (ref 70–99)
Potassium: 4.3 mmol/L (ref 3.5–5.1)
Sodium: 140 mmol/L (ref 135–145)

## 2021-01-25 LAB — GLUCOSE, CAPILLARY: Glucose-Capillary: 133 mg/dL — ABNORMAL HIGH (ref 70–99)

## 2021-01-25 MED ORDER — SENNOSIDES-DOCUSATE SODIUM 8.6-50 MG PO TABS: 2.0000 | ORAL_TABLET | Freq: Every day | ORAL | 0 refills | Status: DC

## 2021-01-25 MED ORDER — IBUPROFEN 800 MG PO TABS: 800.0000 mg | ORAL_TABLET | Freq: Three times a day (TID) | ORAL | 1 refills | Status: DC | PRN

## 2021-01-25 MED ORDER — OXYCODONE HCL 5 MG PO TABS: 5.0000 mg | ORAL_TABLET | ORAL | 0 refills | Status: DC | PRN

## 2021-01-25 NOTE — Discharge Summary (Signed)
Physician Discharge Summary  Patient ID: Alyssa Becker MRN: [id number] DOB/AGE: 14-Dec-1995 26 y.o.  Admit date: 01/24/2021 Discharge date: 01/25/2021  Admission Diagnoses: Metastatic carcinoma [facility])  Discharge Diagnoses:  Principal Problem:   Metastatic carcinoma [facility]) Active Problems:   Malignant neoplasm of cervix ([place])   Ovarian mass, right   Discharged Condition:  The patient is in good condition and stable for discharge.    Hospital Course: On 01/24/2021, the patient underwent the following: Procedure(s): XI ROBOTIC ASSISTED SALPINGECTOMY; RIGHT URETERAL LYSIS; LYSIS OF ADHESIONS XI ROBOTIC ASSISTED RIGHT OOPHORECTOMY, OOPHOROPEXY OF LEFT OVARY. Due to the extent of dissection during surgery along with purulent drainage (IV antibiotics given intraop) from the right ovarian mass intraoperatively from possible previous infection, the patient was kept overnight for observation with am labs. Her WBC count on POD1 was 14.2 (received decadron intraop) and she remained afebrile. The postoperative course was uneventful.  She was discharged to home on postoperative day 1 tolerating a regular diet, ambulating, voiding, pain controlled with oral medications.   Consults: None  Significant Diagnostic Studies: AM Labs  Treatments: Surgery: see above, IV antibiotics given intraop  Discharge Exam: Blood pressure 133/69, pulse 96, temperature 98.7 F (37.1 C), temperature source Oral, resp. rate 17, last menstrual period 01/23/2021, SpO2 96 %. General appearance: alert, cooperative, no distress, and sitting on the side of bed eating breakfast Resp: clear to auscultation bilaterally Cardio: regular rate and rhythm, S1, S2 normal, no murmur, click, rub or gallop GI: soft, non-tender; bowel sounds normal; no masses,  no organomegaly Extremities: extremities normal, atraumatic, no cyanosis or edema Incision/Wound: Lap sites to the abdomen intact with dermabond No significant vaginal bleeding  reported  Disposition: Discharge disposition: 01-Home or Self Care       Discharge Instructions     Call MD for:  difficulty breathing, headache or visual disturbances   Complete by: As directed    Call MD for:  extreme fatigue   Complete by: As directed    Call MD for:  hives   Complete by: As directed    Call MD for:  persistant dizziness or light-headedness   Complete by: As directed    Call MD for:  persistant nausea and vomiting   Complete by: As directed    Call MD for:  redness, tenderness, or signs of infection (pain, swelling, redness, odor or green/yellow discharge around incision site)   Complete by: As directed    Call MD for:  severe uncontrolled pain   Complete by: As directed    Call MD for:  temperature >100.4   Complete by: As directed    Diet - low sodium heart healthy   Complete by: As directed    Driving Restrictions   Complete by: As directed    No driving for 1 week.  Do not take narcotics and drive.   Increase activity slowly   Complete by: As directed    Lifting restrictions   Complete by: As directed    No lifting greater than 10 lbs, pushing, pulling, straining for 6 weeks.   No wound care   Complete by: As directed    Sexual Activity Restrictions   Complete by: As directed    No sexual activity, nothing in the vagina, for 4 weeks.      Allergies as of 01/25/2021       Reactions   Pineapple Anaphylaxis, Swelling   Throat swells   Prednisone Other (See Comments)   Bloating  Medication List     TAKE these medications    acetaminophen 500 MG tablet Commonly known as: TYLENOL Take 1,000 mg by mouth every 6 (six) hours as needed for moderate pain.   albuterol 108 (90 Base) MCG/ACT inhaler Commonly known as: VENTOLIN HFA Inhale 1-2 puffs into the lungs every 6 (six) hours as needed for wheezing or shortness of breath.   ferrous sulfate 325 (65 FE) MG tablet Take 325 mg by mouth daily.   ibuprofen 800 MG tablet Commonly  known as: ADVIL Take 1 tablet (800 mg total) by mouth every 8 (eight) hours as needed for moderate pain. What changed:  medication strength how much to take when to take this reasons to take this   metFORMIN 500 MG tablet Commonly known as: GLUCOPHAGE Take 1 tablet (500 mg total) by mouth 2 (two) times daily with a meal.   oxyCODONE 5 MG immediate release tablet Commonly known as: Oxy IR/ROXICODONE Take 1-2 tablets (5-10 mg total) by mouth every 4 (four) hours as needed for severe pain. For AFTER surgery pain, do not take and drive   senna-docusate 8.6-50 MG tablet Commonly known as: Senokot-S Take 2 tablets by mouth at bedtime. Do not take if having loose stools   Vitamin C 500 MG Chew Chew 500 mg by mouth daily.        Follow-up Information     [name], MD Follow up on 01/30/2021.   Specialty: Gynecologic Oncology Why: 01/30/21 at 4:20pm will be a PHONE call to discuss pathology and check in. If pathology comes back sooner, she will call you sooner. IN PERSON visit will be on 02/18/21 at 3:15pm at the [facility]. Contact information: [address] [place] [address] [phone]                 Greater than thirty minutes were spend for face to face discharge instructions and discharge orders/summary in EPIC.   Signed: Dorothyann Gibbs 01/25/2021, 9:48 AM

## 2021-01-25 NOTE — Discharge Instructions (Signed)
01/25/2021  Return to work: 4-6 weeks if applicable  Dr. Berline Lopes will contact you with the results of the pathology and the discussion that took place at our tumor conference.  Activity: 1. Be up and out of the bed during the day.  Take a nap if needed.  You may walk up steps but be careful and use the hand rail.  Stair climbing will tire you more than you think, you may need to stop part way and rest.   2. No lifting or straining over 10 lbs, pushing, pulling, straining for 6 weeks.  3. No driving for 1 week(s).  Do not drive if you are taking narcotic pain medicine. You need to make sure your reaction time has returned and you can brake safely.  4. Shower daily.  Use your regular soap to bathe and when finished pat your incision dry; don't rub.  No tub baths until cleared by your surgeon.   5. No sexual activity and nothing in the vagina for 4-6 weeks.  6. You may experience a small amount of clear drainage from your incisions, which is normal.  If the drainage persists or increases, please call the office.  7. You may experience vaginal spotting after surgery. The spotting is normal but if you experience heavy bleeding, call our office.  8. Take Tylenol or ibuprofen first for pain and only use narcotic pain medication (oxycodone) for severe pain not relieved by the Tylenol or Ibuprofen.  Monitor your Tylenol intake to a max of 4,000 mg.   Diet: 1. Low sodium Heart Healthy Diet is recommended.  2. It is safe to use a laxative, such as Miralax or Colace, if you have difficulty moving your bowels. You can take Sennakot at bedtime every evening to keep bowel movements regular and to prevent constipation.    Wound Care: 1. Keep clean and dry.  Shower daily.  Reasons to call the Doctor: Fever - Oral temperature greater than 100.4 degrees Fahrenheit Foul-smelling vaginal discharge Difficulty urinating Nausea and vomiting Increased pain at the site of the incision that is unrelieved with  pain medicine. Difficulty breathing with or without chest pain New calf pain especially if only on one side Sudden, continuing increased vaginal bleeding with or without clots.   Contacts: For questions or concerns you should contact:  Dr. Jeral Pinch at [phone]  Joylene John, NP at [phone]  After Hours: call [phone] and have the GYN Oncologist paged/contacted

## 2021-01-25 NOTE — Progress Notes (Signed)
Assessment unchanged. Pt verbalized understanding of dc instructions including medications, follow up care and when to notify to doctor if any problems occur. Discharged via wc to front entrance accompanied by NT and cousin.

## 2021-01-28 ENCOUNTER — Other Ambulatory Visit: Payer: Self-pay | Admitting: Oncology

## 2021-01-28 ENCOUNTER — Telehealth: Payer: Self-pay

## 2021-01-28 ENCOUNTER — Encounter (HOSPITAL_COMMUNITY): Payer: Self-pay | Admitting: Hematology

## 2021-01-28 ENCOUNTER — Telehealth: Payer: Self-pay | Admitting: Gynecologic Oncology

## 2021-01-28 LAB — BPAM RBC
Blood Product Expiration Date: 202301252359
Blood Product Expiration Date: 202301262359
Unit Type and Rh: 6200
Unit Type and Rh: 6200

## 2021-01-28 LAB — TYPE AND SCREEN
ABO/RH(D): A POS
Antibody Screen: POSITIVE
Donor AG Type: NEGATIVE
Donor AG Type: NEGATIVE
Unit division: 0
Unit division: 0

## 2021-01-28 LAB — CYTOLOGY - NON PAP

## 2021-01-28 NOTE — Progress Notes (Signed)
Gynecologic Oncology Multi-Disciplinary Disposition Conference Note  Date of the Conference: 01/28/2021  Patient Name: Alyssa Becker  Referring Provider: Dr. Nelda Marseille Primary GYN Oncologist: Dr. 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 Disposition is to chemotherapy and radiation.   This Multidisciplinary conference took place involving physicians from [place], [place], Radiation Oncology, Pathology, Radiology along with the Gynecologic Oncology Nurse Practitioner and RN.  Comprehensive assessment of the patient's malignancy, staging, need for surgery, chemotherapy, radiation therapy, and need for further testing were reviewed. Supportive measures, both inpatient and following discharge were also discussed. The recommended plan of care is documented. Greater than 35 minutes were spent correlating and coordinating this patient's care.

## 2021-01-28 NOTE — Telephone Encounter (Addendum)
Spoke with Alyssa Becker this morning. She states she is eating, drinking and urinating well. She is having regular BM's. Encouraged her to drink plenty of water. She denies fever or chills. Incisions are dry and intact, she reports some bruising to her sides which is expected. She rates her pain 1/10. Her pain is controlled with alternating tylenol and ibuprofen.     Instructed to call office with any fever, chills, purulent drainage, uncontrolled pain or any other questions or concerns. Patient verbalizes understanding.   Pt aware of post op appointments as well as the office number [phone] and after hours number [phone] to call if she has any questions or concerns

## 2021-01-28 NOTE — Telephone Encounter (Signed)
Called patient and informed her of recent preop labs and need for evaluation with the anemia clinic for possible blood transfusion prior to surgery.

## 2021-01-28 NOTE — Telephone Encounter (Signed)
Called the patient to follow-up after surgery.  She is overall doing well.  Reports minimal abdominal pain.  Having regular bowel and bladder function.  Has a small amount of bruising around several incisions.  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  We discussed that this means ovary represented metastatic disease from her cervix cancer.  On our review of her imaging today at tumor board, large tumor is centered within the cervix although some extension into the lower uterine segment.  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  Given metastatic disease confined to the pelvis including some suspicious adenopathy on recent PET scan, I continue to favor definitive treatment with chemoradiation.  Given histology as well as involvement of the 1 ovary with endocervical adenocarcinoma, discussed that patient may benefit from removal of the other ovary in the short interim after completing treatment.  Jeral Pinch MD Gynecologic Oncology

## 2021-01-28 NOTE — Telephone Encounter (Signed)
Attempted to reach Alyssa Becker to check in with her post-operatively. Unable to contact patient and unable to leave voicemail.

## 2021-01-29 LAB — AEROBIC/ANAEROBIC CULTURE W GRAM STAIN (SURGICAL/DEEP WOUND): Culture: NO GROWTH

## 2021-01-30 ENCOUNTER — Inpatient Hospital Stay: Payer: Medicaid Other | Admitting: Gynecologic Oncology

## 2021-01-30 NOTE — Progress Notes (Signed)
GYN Location of Tumor / Histology: Cervical  Alyssa Becker presented with symptoms of: abdominal pain all along the right side of her abdomen ribs to pelvis. Large mixed solid and cystic mass in the right ovary measuring up to 10.1 cm seen on CT.  Biopsies revealed:  A. CERVIX, MASS, BIOPSY:  - Adenocarcinoma.   A. OVARY AND FALLOPIAN TUBE, RIGHT, SALPINGO OOPHORECTOMY:  - Endometrioid carcinoma, moderately differentiated, involving right  ovary  - No evidence of ovarian surface involvement by carcinoma  - Segment of fallopian tube, negative for carcinoma  - See oncology table  - See comment   B. FALLOPIAN TUBE, LEFT, SALPINGECTOMY:  - Segment of fallopian tube, negative for carcinoma   Past/Anticipated interventions by Gyn/Onc surgery, if any:  Operation: Robotic-assisted laparoscopic bilateral salpingectomy, right oophorectomy, lysis of adhesions of approximately 45 minutes, right ureterolysis Surgeon: Jeral Pinch MD  Past/Anticipated interventions by medical oncology, if any:  Dr Berline Lopes -  I continue to favor definitive treatment with chemoradiation.  Weight changes, if any: no  Bowel/Bladder complaints, if any: Yes.  , urinary frequency, urgency, nocturia x2  Nausea/Vomiting, if any: no  Pain issues, if any:  no  SAFETY ISSUES: Prior radiation? no Pacemaker/ICD? no Possible current pregnancy? No, hysterectomy Is the patient on methotrexate? no  Current Complaints / other details:  none  Vitals:   02/06/21 1425  BP: (!) 141/72  Pulse: 86  Resp: 18  Temp: (!) 97.4 F (36.3 C)  TempSrc: Temporal  SpO2: 100%  Weight: 282 lb (127.9 kg)  Height: 5\' 6"  (1.676 m)

## 2021-02-04 ENCOUNTER — Telehealth: Payer: Self-pay | Admitting: Oncology

## 2021-02-04 NOTE — Telephone Encounter (Signed)
Called [place] and reminded her of appointment with Dr. Alvy Bimler tomorrow.  She said she knows about the appointment and will arrive at 12:30.

## 2021-02-05 ENCOUNTER — Inpatient Hospital Stay (HOSPITAL_BASED_OUTPATIENT_CLINIC_OR_DEPARTMENT_OTHER): Payer: Medicaid Other | Admitting: Hematology and Oncology

## 2021-02-05 ENCOUNTER — Other Ambulatory Visit: Payer: Self-pay

## 2021-02-05 VITALS — BP 138/60 | HR 92 | Temp 97.6°F | Resp 18 | Ht 66.0 in | Wt 288.0 lb

## 2021-02-05 DIAGNOSIS — D500 Iron deficiency anemia secondary to blood loss (chronic): Secondary | ICD-10-CM | POA: Diagnosis not present

## 2021-02-05 DIAGNOSIS — Z6841 Body Mass Index (BMI) 40.0 and over, adult: Secondary | ICD-10-CM | POA: Diagnosis not present

## 2021-02-05 DIAGNOSIS — E119 Type 2 diabetes mellitus without complications: Secondary | ICD-10-CM

## 2021-02-05 DIAGNOSIS — C539 Malignant neoplasm of cervix uteri, unspecified: Secondary | ICD-10-CM | POA: Diagnosis not present

## 2021-02-05 MED ORDER — LIDOCAINE-PRILOCAINE 2.5-2.5 % EX CREA: TOPICAL_CREAM | CUTANEOUS | 3 refills | Status: DC

## 2021-02-05 MED ORDER — ONDANSETRON HCL 8 MG PO TABS: 8.0000 mg | ORAL_TABLET | Freq: Three times a day (TID) | ORAL | 1 refills | Status: DC | PRN

## 2021-02-05 MED ORDER — PROCHLORPERAZINE MALEATE 10 MG PO TABS: 10.0000 mg | ORAL_TABLET | Freq: Four times a day (QID) | ORAL | 1 refills | Status: DC | PRN

## 2021-02-05 NOTE — Progress Notes (Signed)
START OFF PATHWAY REGIMEN - Other   [id number]:Cisplatin 40 mg/m2 IV D1 q7 Days + RT:   A cycle is every 7 days:     Cisplatin   **Always confirm dose/schedule in your pharmacy ordering system**  Patient Characteristics: Intent of Therapy: Curative Intent, Discussed with Patient 

## 2021-02-05 NOTE — Progress Notes (Signed)
Radiation Oncology         [phone] ________________________________  Initial Outpatient Consultation  Name: Alyssa Becker MRN: [id number]  Date: 02/06/2021  DOB: November 26, 1995  [id number], [facility] Dept Personal  [name], MD   REFERRING PHYSICIAN: [name], MD  DIAGNOSIS: Stage IIIC-2 adenocarcinoma of the cervix    HISTORY OF PRESENT ILLNESS::Alyssa Becker is a 26 y.o. female who is accompanied by a cousin. she is seen as a courtesy of Dr. Berline Lopes for an opinion concerning radiation therapy as part of management for her recently diagnosed cervical cancer.   The patient presented to the ED on 10/01/20 with worsening right abdominal/pelvic pain x2 days. CT of the abdomen and pelvis performed for ED evaluation revealed a large mixed solid and cystic mass in the right ovary measuring up to 10.1 cm, concerning for ovarian cystic neoplasm. Transabdominal and transvaginal pelvic ultrasound that same date revealed a large indeterminate right adnexal mass with solid hypervascular and cystic components. Given the patient's young age, hypervascularity and leukocytosis, findings were noted to possibly represent an atypical tubo-ovarian abscess. However, the appearance still remained concerning for ovarian malignancy despite the patient's young age. (Left ovary noted to appear grossly normal).  The patient was treated for presumed PID.  She received 24 hours of IV antibiotics and then was discharged home to finish an oral course of antibiotics.   Subsequently, the patient was referred to Dr. Nelda Marseille, [facility], on 01/02/21 for further evaluation. During this visit, the patient reported feeling much better and no longer reported abdominal pain. The patient reported that her recent menstrual cycles last for 2 days with moderate-heavy flow. In contrast, her normal/past menses would last for about 5-7 days, though also reported a long history of intermenstrual  spotting.  Pelvic exam performed during this visit revealed a friable, irregular, villous appearing mass located at the cervical os. Excessive bleeding was noted during exam which made further visualization difficult.  Biopsy of the cervical mass and Pap smear collected by Dr. Nelda Marseille on 01/02/21 revealed adenocarcinoma, favoring an endocervical origin.  MRI of the pelvis on 01/15/21 showed the 13.0 cm cyctic and solid mass arising from the right ovary as highly suspicious for ovarian carcinoma. Mild endometrial thickening was also appreciated, and the 4.5 cm soft tissue mass within the endocervical canal. Differential diagnoses included prolapsing endometrial polyp or carcinoma, or primary cervical carcinoma in regards to this finding. Otherwise, no evidence of pelvic metastatic disease was appreciated.   Accordingly, the patient was referred to Dr. Berline Lopes on 01/16/21. Pelvic exam performed during this visit again revealed the central portion of the cervix as replaced by a friable mass. On bimanual exam, the cervix was appreciated to measure 5-6 cm, and noted as barrel shaped and firm. On rectovaginal exam, firmness and questionable nodularity were appreciated along the posterior aspect of the cervix, noted as non-definitive for parametrial involvement. The adnexal mass was also appreciated on abdominal hands, but not palpable on rectovaginal exam.  During this visit, the patient reported her appetite to come and go, significant fatigue for the past 5 days, and a recent onset of vaginal discharge. Following evaluation and review of pathology and imaging, Dr. Berline Lopes ultimately recommended proceeding with removal of the abnormal ovary, bilateral fallopian tubes, and pexing of the other ovary to get it out of the pelvis and radiation field.   PET on 01/22/21 demonstrated the right adnexal mass as markedly hypermetabolic and consistent with known neoplasm, and the extensive  hypermetabolic tumor involving the  endometrium and cervix. Scattered borderline enlarged and mildly hypermetabolic retroperitoneal and external iliac nodes were also visualized. No findings for metastatic disease involving the chest or bony Structures were otherwise appreciated.   The patient opted to proceed with right salpingo oophorectomy and left salpingectomy on 01/24/21 under the care of Dr. Berline Lopes. 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 Left salpingectomy showed no evidence of carcinoma. Peritoneal washing also performed revealed no malignant cells present, and reactive mesothelial cells.   The left ovary was mobilized and secured along the left abdominal sidewall within the paraColic gutter.  Clips were placed around this ovary to help delineate during anticipated radiation therapy and hopefully to avoid this ovary during her radiation therapy.  The patient's case was discussed at the [facility] on 01/29/20.  The patient's PET scan was reviewed and findings were very suspicious for pelvic and periaortic nodal metastasis.  Following discussion, referrals were placed for consideration of chemotherapy and radiation. The patient met with Dr. Alvy Bimler on 02/06/20 to discuss systemic treatment options, though encounter notes are pending at this time.    PREVIOUS RADIATION THERAPY: No  PAST MEDICAL HISTORY:  Past Medical History:  Diagnosis Date   Anemia    Anxiety    Asthma    Depression    Diabetes mellitus without complication ([place])    type 2   Headache    migraine   Morbid obesity ([place])    Ovarian mass     PAST SURGICAL HISTORY: Past Surgical History:  Procedure Laterality Date   TONSILLECTOMY     XI ROBOTIC ASSISTED OOPHORECTOMY N/A 01/24/2021   Procedure: XI ROBOTIC ASSISTED RIGHT OOPHORECTOMY, OOPHOROPEXY OF LEFT OVARY;   Surgeon: [name], MD;  Location: [facility];  Service: Gynecology;  Laterality: N/A;   XI ROBOTIC ASSISTED SALPINGECTOMY Bilateral 01/24/2021   Procedure: XI ROBOTIC ASSISTED SALPINGECTOMY; RIGHT URETERAL LYSIS; LYSIS OF ADHESIONS;  Surgeon: [name], MD;  Location: [facility];  Service: Gynecology;  Laterality: Bilateral;    FAMILY HISTORY:  Family History  Problem Relation Age of Onset   Asthma Mother    Diabetes Mother    Hypertension Mother    Breast cancer Mother    Asthma Father    Diabetes Father    Kidney disease Father    Diabetes Sister    Asthma Sister    Asthma Brother    Diabetes Brother    Cancer Maternal Aunt    Diabetes Maternal Uncle    Diabetes Maternal Grandmother    Hypertension Maternal Grandmother    Anemia Maternal Grandmother    Heart attack Paternal Grandfather    Cancer Paternal Grandfather    Colon cancer Neg Hx    Ovarian cancer Neg Hx    Endometrial cancer Neg Hx    Pancreatic cancer Neg Hx    Prostate cancer Neg Hx     SOCIAL HISTORY:  Social History   Tobacco Use   Smoking status: Never   Smokeless tobacco: Never  Vaping Use   Vaping Use: Never used  Substance Use Topics   Alcohol use: Never   Drug use: Never    ALLERGIES:  Allergies  Allergen Reactions   Pineapple Anaphylaxis and Swelling    Throat swells   Prednisone Other (See Comments)    Bloating    MEDICATIONS:  Current Outpatient Medications  Medication  Sig Dispense Refill   acetaminophen (TYLENOL) 500 MG tablet Take 1,000 mg by mouth every 6 (six) hours as needed for moderate pain.     albuterol (VENTOLIN HFA) 108 (90 Base) MCG/ACT inhaler Inhale 1-2 puffs into the lungs every 6 (six) hours as needed for wheezing or shortness of breath.     Ascorbic Acid (VITAMIN C) 500 MG CHEW Chew 500 mg by mouth daily.     ferrous sulfate 325 (65 FE) MG tablet Take 325 mg by mouth daily.      ibuprofen (ADVIL) 800 MG tablet Take 1 tablet (800 mg total) by mouth every  8 (eight) hours as needed for moderate pain. 30 tablet 1   metFORMIN (GLUCOPHAGE) 500 MG tablet Take 1 tablet (500 mg total) by mouth 2 (two) times daily with a meal. 60 tablet 1   lidocaine-prilocaine (EMLA) cream Apply to affected area once (Patient not taking: Reported on 02/06/2021) 30 g 3   ondansetron (ZOFRAN) 8 MG tablet Take 1 tablet (8 mg total) by mouth every 8 (eight) hours as needed. (Patient not taking: Reported on 02/06/2021) 30 tablet 1   oxyCODONE (OXY IR/ROXICODONE) 5 MG immediate release tablet Take 1-2 tablets (5-10 mg total) by mouth every 4 (four) hours as needed for severe pain. For AFTER surgery pain, do not take and drive (Patient not taking: Reported on 02/06/2021) 15 tablet 0   prochlorperazine (COMPAZINE) 10 MG tablet Take 1 tablet (10 mg total) by mouth every 6 (six) hours as needed (Nausea or vomiting). (Patient not taking: Reported on 02/06/2021) 30 tablet 1   senna-docusate (SENOKOT-S) 8.6-50 MG tablet Take 2 tablets by mouth at bedtime. Do not take if having loose stools (Patient not taking: Reported on 02/06/2021) 30 tablet 0   No current facility-administered medications for this encounter.    REVIEW OF SYSTEMS:  A 10+ POINT REVIEW OF SYSTEMS WAS OBTAINED including neurology, dermatology, psychiatry, cardiac, respiratory, lymph, extremities, GI, GU, musculoskeletal, constitutional, reproductive, HEENT.  She reports that her vaginal bleeding has subsided at this time.  She has some pain within the pelvis and low back region but not severe at this point.   PHYSICAL EXAM:  height is '5\' 6"'  (1.676 m) and weight is 282 lb (127.9 kg). Her temporal temperature is 97.4 F (36.3 C) (abnormal). Her blood pressure is 141/72 (abnormal) and her pulse is 86. Her respiration is 18 and oxygen saturation is 100%.   General: Alert and oriented, in no acute distress HEENT: Head is normocephalic. Extraocular movements are intact.  Neck: Neck is supple, no palpable cervical or supraclavicular  lymphadenopathy. Heart: Regular in rate and rhythm with no murmurs, rubs, or gallops. Chest: Clear to auscultation bilaterally, with no rhonchi, wheezes, or rales. Abdomen: Soft, nontender, nondistended, with no rigidity or guarding. Extremities: No cyanosis or edema. Lymphatics: see Neck Exam Skin: No concerning lesions. Musculoskeletal: symmetric strength and muscle tone throughout. Neurologic: Cranial nerves II through XII are grossly intact. No obvious focalities. Speech is fluent. Coordination is intact. Psychiatric: Judgment and insight are intact. Affect is appropriate. The patient was not prepared to have a pelvic exam today and did not allow this to happen.    ECOG = 1  0 - Asymptomatic (Fully active, able to carry on all predisease activities without restriction)  1 - Symptomatic but completely ambulatory (Restricted in physically strenuous activity but ambulatory and able to carry out work of a light or sedentary nature. For example, light housework, office work)  2 - Symptomatic, <  50% in bed during the day (Ambulatory and capable of all self care but unable to carry out any work activities. Up and about more than 50% of waking hours)  3 - Symptomatic, >50% in bed, but not bedbound (Capable of only limited self-care, confined to bed or chair 50% or more of waking hours)  4 - Bedbound (Completely disabled. Cannot carry on any self-care. Totally confined to bed or chair)  5 - Death   Eustace Pen MM, Creech RH, Tormey DC, et al. [phone]). "Toxicity and response criteria of the [facility]". [place] Oncol. 5 (6): 649-55  LABORATORY DATA:  Lab Results  Component Value Date   WBC 14.2 (H) 01/25/2021   HGB 9.3 (L) 01/25/2021   HCT 34.8 (L) 01/25/2021   MCV 66.5 (L) 01/25/2021   PLT 408 (H) 01/25/2021   NEUTROABS 12.1 (H) 01/25/2021   Lab Results  Component Value Date   NA 140 01/25/2021   K 4.3 01/25/2021   CL 105 01/25/2021   CO2 24 01/25/2021    GLUCOSE 108 (H) 01/25/2021   CREATININE 0.59 01/25/2021   CALCIUM 9.4 01/25/2021      RADIOGRAPHY: MR PELVIS W WO CONTRAST  Result Date: 01/16/2021 CLINICAL DATA:  Right adnexal mass and cervical mass on recent CT. EXAM: MRI PELVIS WITHOUT AND WITH CONTRAST TECHNIQUE: Multiplanar multisequence MR imaging of the pelvis was performed both before and after administration of intravenous contrast. CONTRAST:  41m GADAVIST GADOBUTROL 1 MMOL/ML IV SOLN COMPARISON:  CT on 10/01/2020 FINDINGS: Lower Urinary Tract: No urinary bladder or urethral abnormality identified. Bowel: Unremarkable pelvic bowel loops. Vascular/Lymphatic: Unremarkable. No pathologically enlarged pelvic lymph nodes identified. Reproductive: -- Uterus: Measures 9.6 x 5.0 by 5.6 cm (volume = 140 cm^3). No fibroids identified. Endometrium measures 9 mm in thickness and shows heterogeneous T2 signal intensity and mild contrast enhancement. A soft tissue mass is seen within the endocervical canal which measures 4.5 x 4.1 cm. This mass shows no definite evidence of cervical stromal involvement, but does involve the endometrium and myometrium in the lower uterine segment. Differential diagnosis includes prolapsing endometrial polyp or carcinoma, or primary cervical carcinoma. There is no evidence of parametrial or vaginal involvement. -- Right ovary: A large cystic and solid mass is seen in the right upper pelvis, arising from the right ovary. This has numerous enhancing mural soft tissue nodules and measures 13.0 x 10.1 by 10.5 cm. This is highly suspicious for ovarian carcinoma. -- Left ovary: Appears normal. No ovarian or adnexal masses identified. Other: No peritoneal thickening or abnormal free fluid. Musculoskeletal:  Unremarkable. IMPRESSION: 13.0 cm cystic and solid mass arising from the right ovary, highly suspicious for ovarian carcinoma. Mild endometrial thickening and 4.5 cm soft tissue mass within the endocervical canal. Differential  diagnosis includes prolapsing endometrial polyp or carcinoma, or primary cervical carcinoma. No evidence of pelvic metastatic disease. Electronically Signed   By: JMarlaine HindM.D.   On: 01/16/2021 10:50   NM PET Image Initial (PI) Skull Base To Thigh (F-18 FDG)  Result Date: 01/22/2021 CLINICAL DATA:  Initial treatment strategy for uterine/cervical cancer. EXAM: NUCLEAR MEDICINE PET SKULL BASE TO THIGH TECHNIQUE: 14.0 mCi F-18 FDG was injected intravenously. Full-ring PET imaging was performed from the skull base to thigh after the radiotracer. CT data was obtained and used for attenuation correction and anatomic localization. Fasting blood glucose: 105 mg/dl COMPARISON:  CT scan 10/01/2020 and MRI 01/15/2021 FINDINGS: Mediastinal blood pool activity: SUV max 1.96 Liver activity: SUV  max NA NECK: No hypermetabolic lymph nodes in the neck. Incidental CT findings: none CHEST: No hypermetabolic mediastinal or hilar nodes. No suspicious pulmonary nodules on the CT scan. Incidental CT findings: none ABDOMEN/PELVIS: The large (12 x 10 cm) cystic/necrotic appearing right adnexal mass demonstrates areas of marked hypermetabolism with SUV max of 17.69. Extensive hypermetabolic tumor involving the endometrium and cervix with SUV max of 22.79. Scattered borderline retroperitoneal lymph nodes are mildly hypermetabolic. 9 mm node along the anterior aspect of the IVC on image 136/4 has an SUV max of 4.05. Left-sided external iliac lymph node just lateral to the left ovary measures 10.5 mm and SUV max is 3.67 no evidence of omental disease or diffuse peritoneal carcinomatosis. Incidental CT findings: Moderate splenomegaly. SKELETON: No findings for osseous metastatic disease. Incidental CT findings: none IMPRESSION: 1. Large cystic/necrotic right adnexal mass is markedly hypermetabolic and consistent with known neoplasm. 2. Extensive hypermetabolic tumor involving the endometrium and cervix. 3. Scattered borderline enlarged  and mildly hypermetabolic retroperitoneal and external iliac nodes. 4. No findings for metastatic disease involving the chest or bony structures. Electronically Signed   By: Marijo Sanes M.D.   On: 01/22/2021 16:36      IMPRESSION: Stage IIIC-2 adenocarcinoma of the cervix  We reviewed the pathologic, physical exam findings and imaging findings related to her diagnosis.  I discussed with the patient that she has an inoperable stage which is likely spread into the pelvic and periaortic nodal areas without distant metastasis.  This would be potentially curable but this will entail extensive treatment including external beam radiation therapy and intracavitary brachytherapy treatments.  In addition radiosensitizing chemotherapy would be a component of her treatment.  Today, I talked to the patient and and cousin about the findings and work-up thus far.  We discussed the natural history of adenocarcinoma of the cervix and general treatment, highlighting the role of radiotherapy in the management.  We discussed the available radiation techniques, and focused on the details of logistics and delivery.  We reviewed the anticipated acute and late sequelae associated with radiation in this setting.  She understands that with her malignancy she would never be able to have children of her own and it is possible that with her radiation therapy she may go into early menopause depending on the location of her left ovary as it relates to her pelvic/periaortic nodal coverage.  The patient was encouraged to ask questions that I answered to the best of my ability.  A patient consent form was discussed and signed.  We retained a copy for our records.  The patient would like to proceed with radiation and will be scheduled for CT simulation.  PLAN: She will return for CT simulation on January 13.  She would be ready to start radiation therapy in approximately 1 week.  Her radiosensitizing chemotherapy is scheduled to begin  February 3.  If she starts developing significant vaginal bleeding we would be able to initiate her radiation therapy at a earlier date.   60 minutes of total time was spent for this patient encounter, including preparation, face-to-face counseling with the patient and coordination of care, physical exam, and documentation of the encounter.   ------------------------------------------------  Blair Promise, PhD, MD  This document serves as a record of services personally performed by Gery Pray, MD. It was created on his behalf by Roney Mans, a trained medical scribe. The creation of this record is based on the scribe's personal observations and the provider's statements to them. This  document has been checked and approved by the attending provider.

## 2021-02-06 ENCOUNTER — Other Ambulatory Visit: Payer: Self-pay | Admitting: Hematology and Oncology

## 2021-02-06 ENCOUNTER — Encounter: Payer: Self-pay | Admitting: Hematology and Oncology

## 2021-02-06 ENCOUNTER — Encounter: Payer: Self-pay | Admitting: Radiation Oncology

## 2021-02-06 ENCOUNTER — Ambulatory Visit
Admission: RE | Admit: 2021-02-06 | Discharge: 2021-02-06 | Disposition: A | Discharge status: Home / Self Care | Payer: Medicaid Other | Source: Ambulatory Visit | Attending: Radiation Oncology | Admitting: Radiation Oncology

## 2021-02-06 ENCOUNTER — Other Ambulatory Visit: Payer: Self-pay

## 2021-02-06 ENCOUNTER — Encounter: Payer: Self-pay | Admitting: Oncology

## 2021-02-06 ENCOUNTER — Inpatient Hospital Stay: Payer: Medicaid Other

## 2021-02-06 VITALS — BP 141/72 | HR 86 | Temp 97.4°F | Resp 18 | Ht 66.0 in | Wt 282.0 lb

## 2021-02-06 DIAGNOSIS — C539 Malignant neoplasm of cervix uteri, unspecified: Secondary | ICD-10-CM | POA: Insufficient documentation

## 2021-02-06 DIAGNOSIS — Z7984 Long term (current) use of oral hypoglycemic drugs: Secondary | ICD-10-CM | POA: Diagnosis not present

## 2021-02-06 DIAGNOSIS — Z803 Family history of malignant neoplasm of breast: Secondary | ICD-10-CM | POA: Insufficient documentation

## 2021-02-06 DIAGNOSIS — R9389 Abnormal findings on diagnostic imaging of other specified body structures: Secondary | ICD-10-CM | POA: Insufficient documentation

## 2021-02-06 DIAGNOSIS — R590 Localized enlarged lymph nodes: Secondary | ICD-10-CM | POA: Insufficient documentation

## 2021-02-06 DIAGNOSIS — E119 Type 2 diabetes mellitus without complications: Secondary | ICD-10-CM | POA: Diagnosis not present

## 2021-02-06 DIAGNOSIS — E669 Obesity, unspecified: Secondary | ICD-10-CM | POA: Diagnosis not present

## 2021-02-06 NOTE — Assessment & Plan Note (Signed)
Due to her BMI, the calculated dose of cisplatin is very high I will prescribe upfront dose reduction

## 2021-02-06 NOTE — Progress Notes (Signed)
See MD note for nursing evaluation. °

## 2021-02-06 NOTE — Assessment & Plan Note (Signed)
She had history of severe iron deficiency anemia requiring blood transfusions and IV iron I plan to recheck iron studies again in her next visit

## 2021-02-06 NOTE — Assessment & Plan Note (Signed)
I have reviewed her imaging studies, surgical report and pathology report The patient has cervical cancer, and her case was presented and reviewed at the GYN oncology tumor board Overall consensus would be to proceed with definitive concurrent chemoradiation therapy with weekly cisplatin We discussed the role of chemotherapy. The intent is of curative intent.  We discussed some of the risks, benefits, side-effects of cisplatin and its role as chemo sensitizing agent. The plan for weekly cisplatin for x5 doses along with radiation treatment.  Some of the short term side-effects included, though not limited to, including weight loss, life threatening infections, risk of allergic reactions, need for transfusions of blood products, nausea, vomiting, change in bowel habits, loss of hair, admission to hospital for various reasons, and risks of death.   Long term side-effects are also discussed including risks of infertility, permanent damage to nerve function, hearing loss, chronic fatigue, kidney damage with possibility needing hemodialysis, and rare secondary malignancy including bone marrow disorders.  The patient is aware that the response rates discussed earlier is not guaranteed.  After a long discussion, patient made an informed decision to proceed with the prescribed plan of care.   Patient education material was dispensed. I warned the patient that she would likely be rendered infertile after her treatment We discussed briefly about the role of ovarian tissue cryopreservation for fertility but the patient is not interested I will schedule port placement, chemo education class with tentative start date for treatment on February 22, 2021

## 2021-02-06 NOTE — Progress Notes (Signed)
Met with Alyssa Becker and went over port placement appointment on 02/13/21 at [facility]. Reviewed instructions for NPO after midnight and that she will need a driver.  Also advised her of infusion appointments on Fridays starting on 02/22/21.  Provided her with a printed callander and encouraged her to call with any questions or needs.

## 2021-02-06 NOTE — Progress Notes (Signed)
Met with patient at registration to introduce myself as Arboriculturist and to offer available resources.  Discussed one-time $1000 Radio broadcast assistant to assist with personal expenses while going through treatment.  Gave her my card if interested in applying and for any additional financial questions or concerns.

## 2021-02-06 NOTE — Patient Instructions (Addendum)
t

## 2021-02-06 NOTE — Assessment & Plan Note (Signed)
She denies peripheral neuropathy or other diabetic related complications We will monitor her blood sugar carefully while on treatment

## 2021-02-06 NOTE — Progress Notes (Signed)
[place] progress notes  Patient Care Team: Health, [facility] Dept Personal as PCP - General [name], Craige Cotta, RN as Oncology Nurse Navigator (Oncology)  CHIEF COMPLAINTS/PURPOSE OF VISIT:  Cervical cancer, for further management  HISTORY OF PRESENTING ILLNESS:  Alyssa Becker 26 y.o. female was transferred to my care after her recent new diagnosis of cervical cancer She was originally seen by Dr. 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 Summary of her history is as follows: Oncology History  Malignant neoplasm of cervix ([place])  01/16/2021 Initial Diagnosis   Malignant neoplasm of cervix ([place])   01/22/2021 PET scan   1. Large cystic/necrotic right adnexal mass is markedly hypermetabolic and consistent with known neoplasm. 2. Extensive hypermetabolic tumor involving the endometrium and cervix. 3. Scattered borderline enlarged and mildly hypermetabolic retroperitoneal and external iliac nodes. 4. No findings for metastatic disease involving the chest or bony structures.   01/24/2021 Surgery   Pre-operative Diagnosis: Complex adnexal mass, elevated CA-125, locally invasive cervical adenocarcinoma versus endometrial adenocarcinoma   Post-operative Diagnosis: same, high-grade carcinoma of the right ovary, suspected history of superinfection of the adnexal mass   Operation: Robotic-assisted laparoscopic bilateral salpingectomy, right oophorectomy, lysis of adhesions of  approximately 45 minutes, right ureterolysis   Surgeon: Jeral Pinch MD    Operative Findings: On EUA, cervix enlarged and firm, fleshy tumor replacing the endocervix.  No definitive parametrial involvement appreciated.  Even gentle exam because significant bleeding so vagina was packed with lap sponges for the remainder of surgery.  On intra-abdominal exam, normal upper abdominal survey including omentum, liver edge, diaphragm, and stomach.  Normal small bowel.  Some physiologic adhesions of the sigmoid epiploica to the left sidewall.  Normal-appearing left ovary measuring approximately 3-4 cm.  Normal-appearing bilateral fallopian tubes.  Uterus approximately 8-10 cm and overall normal in appearance.  Right ovary replaced by a smooth but vascular 10 cm mass with multiple loculations.  Ovary with dense adhesions to the right pelvic sidewall.  Upon very careful manipulation of the mass for its removal, there was some drainage of fluid noted that was somewhat purulent.  This was sent for culture and Gram stain.  Upon contained mass removal with fractionation of the mass, tumor versus necrotic material was noted.  Frozen section consistent with high-grade carcinoma, unable to differentiate on frozen section whether this is the same cancer being seen on her cervical biopsies.  Right ureterolysis performed given oozing services from the broad ligament and peritoneum where adhesions were lysed between the ovary and peritoneum, to assure ureter not in close proximity.  No obvious adenopathy.  No ascites.  Left ovary transposed out of the pelvis along the left gutter, clips placed along the sidewall as well as the superior and inferior aspect of the ovary.     02/05/2021 Cancer Staging   Staging form: Cervix Uteri, AJCC Version 9 - Pathologic stage from 02/05/2021: FIGO Stage II (pT2, pN0, cM0) - Signed by [name], MD on 02/05/2021 Stage prefix: Initial diagnosis    02/22/2021 -  Chemotherapy  Patient is  on Treatment Plan : Cervical cancer Cisplatin q7d       MEDICAL HISTORY:  Past Medical History:  Diagnosis Date   Anemia    Anxiety    Asthma    Depression    Diabetes mellitus without complication ([place])    type 2   Headache    migraine   Morbid obesity ([place])    Ovarian mass     SURGICAL HISTORY: Past Surgical History:  Procedure Laterality Date   TONSILLECTOMY     XI ROBOTIC ASSISTED OOPHORECTOMY N/A 01/24/2021   Procedure: XI ROBOTIC ASSISTED RIGHT OOPHORECTOMY, OOPHOROPEXY OF LEFT OVARY;  Surgeon: [name], MD;  Location: [facility];  Service: Gynecology;  Laterality: N/A;   XI ROBOTIC ASSISTED SALPINGECTOMY Bilateral 01/24/2021   Procedure: XI ROBOTIC ASSISTED SALPINGECTOMY; RIGHT URETERAL LYSIS; LYSIS OF ADHESIONS;  Surgeon: [name], MD;  Location: [facility];  Service: Gynecology;  Laterality: Bilateral;    SOCIAL HISTORY: Social History   Socioeconomic History   Marital status: Single    Spouse name: Not on file   Number of children: Not on file   Years of education: Not on file   Highest education level: Not on file  Occupational History   Not on file  Tobacco Use   Smoking status: Never   Smokeless tobacco: Never  Vaping Use   Vaping Use: Never used  Substance and Sexual Activity   Alcohol use: Never   Drug use: Never   Sexual activity: Not Currently    Birth control/protection: None    Comment: female partner  Other Topics Concern   Not on file  Social History Narrative   Not on file   Social Determinants of Health   Financial Resource Strain: Not on file  Food Insecurity: Not on file  Transportation Needs: Not on file  Physical Activity: Not on file  Stress: Not on file  Social Connections: Not on file  Intimate Partner Violence: Not on file    FAMILY HISTORY: Family History  Problem Relation Age of Onset   Asthma Mother    Diabetes Mother    Hypertension Mother    Breast cancer Mother    Asthma Father    Diabetes Father     Kidney disease Father    Diabetes Sister    Asthma Sister    Asthma Brother    Diabetes Brother    Cancer Maternal Aunt    Diabetes Maternal Uncle    Diabetes Maternal Grandmother    Hypertension Maternal Grandmother    Anemia Maternal Grandmother    Heart attack Paternal Grandfather    Cancer Paternal Grandfather    Colon cancer Neg Hx    Ovarian cancer Neg Hx    Endometrial cancer Neg Hx    Pancreatic cancer Neg Hx    Prostate cancer Neg Hx     ALLERGIES:  is allergic to pineapple and prednisone.  MEDICATIONS:  Current Outpatient Medications  Medication Sig Dispense Refill   acetaminophen (TYLENOL) 500 MG tablet Take 1,000 mg by mouth every 6 (six) hours as needed for moderate pain.     albuterol (VENTOLIN HFA) 108 (90 Base) MCG/ACT inhaler Inhale 1-2 puffs into the lungs every 6 (six) hours as needed for wheezing or shortness of breath.     Ascorbic Acid (VITAMIN C) 500 MG CHEW Chew 500 mg by mouth daily.     ferrous sulfate 325 (65 FE) MG tablet Take 325 mg by mouth daily.  ibuprofen (ADVIL) 800 MG tablet Take 1 tablet (800 mg total) by mouth every 8 (eight) hours as needed for moderate pain. 30 tablet 1   lidocaine-prilocaine (EMLA) cream Apply to affected area once 30 g 3   metFORMIN (GLUCOPHAGE) 500 MG tablet Take 1 tablet (500 mg total) by mouth 2 (two) times daily with a meal. 60 tablet 1   ondansetron (ZOFRAN) 8 MG tablet Take 1 tablet (8 mg total) by mouth every 8 (eight) hours as needed. 30 tablet 1   oxyCODONE (OXY IR/ROXICODONE) 5 MG immediate release tablet Take 1-2 tablets (5-10 mg total) by mouth every 4 (four) hours as needed for severe pain. For AFTER surgery pain, do not take and drive 15 tablet 0   prochlorperazine (COMPAZINE) 10 MG tablet Take 1 tablet (10 mg total) by mouth every 6 (six) hours as needed (Nausea or vomiting). 30 tablet 1   senna-docusate (SENOKOT-S) 8.6-50 MG tablet Take 2 tablets by mouth at bedtime. Do not take if having loose stools  30 tablet 0   No current facility-administered medications for this visit.    REVIEW OF SYSTEMS:   Constitutional: Denies fevers, chills or abnormal night sweats Eyes: Denies blurriness of vision, double vision or watery eyes Ears, nose, mouth, throat, and face: Denies mucositis or sore throat Respiratory: Denies cough, dyspnea or wheezes Cardiovascular: Denies palpitation, chest discomfort or lower extremity swelling Gastrointestinal:  Denies nausea, heartburn or change in bowel habits Skin: Denies abnormal skin rashes Lymphatics: Denies new lymphadenopathy or easy bruising Neurological:Denies numbness, tingling or new weaknesses Behavioral/Psych: Mood is stable, no new changes  All other systems were reviewed with the patient and are negative.  PHYSICAL EXAMINATION: ECOG PERFORMANCE STATUS: 0 - Asymptomatic  Vitals:   02/05/21 1234  BP: 138/60  Pulse: 92  Resp: 18  Temp: 97.6 F (36.4 C)  SpO2: 99%   Filed Weights   02/05/21 1234  Weight: 288 lb (130.6 kg)    GENERAL:alert, no distress and comfortable.  Limited examination due to body mass index SKIN: skin color, texture, turgor are normal, no rashes or significant lesions EYES: normal, conjunctiva are pink and non-injected, sclera clear OROPHARYNX:no exudate, normal lips, buccal mucosa, and tongue  NECK: supple, thyroid normal size, non-tender, without nodularity LYMPH:  no palpable lymphadenopathy in the cervical, axillary or inguinal LUNGS: clear to auscultation and percussion with normal breathing effort HEART: regular rate & rhythm and no murmurs without lower extremity edema ABDOMEN:abdomen soft, non-tender and normal bowel sounds Musculoskeletal:no cyanosis of digits and no clubbing  PSYCH: alert & oriented x 3 with fluent speech NEURO: no focal motor/sensory deficits  LABORATORY DATA:  I have reviewed the data as listed Lab Results  Component Value Date   WBC 14.2 (H) 01/25/2021   HGB 9.3 (L) 01/25/2021    HCT 34.8 (L) 01/25/2021   MCV 66.5 (L) 01/25/2021   PLT 408 (H) 01/25/2021   Recent Labs    10/01/20 0903 10/02/20 0140 01/18/21 0855 01/25/21 0438  NA 135 134* 136 140  K 3.9 3.7 4.1 4.3  CL 101 103 104 105  CO2 26 22 24 24   GLUCOSE 132* 129* 108* 108*  BUN 13 10 14 12   CREATININE 0.59 0.68 0.58 0.59  CALCIUM 9.0 8.7* 8.8* 9.4  GFRNONAA >60 >60 >60 >60  PROT 7.8 6.6 8.1  --   ALBUMIN 4.1 3.4* 4.3  --   AST 16 16 12*  --   ALT 11 11 11   --   [facility]  129* 110 92  --   BILITOT 0.4 0.5 0.3  --     RADIOGRAPHIC STUDIES: I have personally reviewed the radiological images as listed and agreed with the findings in the report. MR PELVIS W WO CONTRAST  Result Date: 01/16/2021 CLINICAL DATA:  Right adnexal mass and cervical mass on recent CT. EXAM: MRI PELVIS WITHOUT AND WITH CONTRAST TECHNIQUE: Multiplanar multisequence MR imaging of the pelvis was performed both before and after administration of intravenous contrast. CONTRAST:  54mL GADAVIST GADOBUTROL 1 MMOL/ML IV SOLN COMPARISON:  CT on 10/01/2020 FINDINGS: Lower Urinary Tract: No urinary bladder or urethral abnormality identified. Bowel: Unremarkable pelvic bowel loops. Vascular/Lymphatic: Unremarkable. No pathologically enlarged pelvic lymph nodes identified. Reproductive: -- Uterus: Measures 9.6 x 5.0 by 5.6 cm (volume = 140 cm^3). No fibroids identified. Endometrium measures 9 mm in thickness and shows heterogeneous T2 signal intensity and mild contrast enhancement. A soft tissue mass is seen within the endocervical canal which measures 4.5 x 4.1 cm. This mass shows no definite evidence of cervical stromal involvement, but does involve the endometrium and myometrium in the lower uterine segment. Differential diagnosis includes prolapsing endometrial polyp or carcinoma, or primary cervical carcinoma. There is no evidence of parametrial or vaginal involvement. -- Right ovary: A large cystic and solid mass is seen in the right upper  pelvis, arising from the right ovary. This has numerous enhancing mural soft tissue nodules and measures 13.0 x 10.1 by 10.5 cm. This is highly suspicious for ovarian carcinoma. -- Left ovary: Appears normal. No ovarian or adnexal masses identified. Other: No peritoneal thickening or abnormal free fluid. Musculoskeletal:  Unremarkable. IMPRESSION: 13.0 cm cystic and solid mass arising from the right ovary, highly suspicious for ovarian carcinoma. Mild endometrial thickening and 4.5 cm soft tissue mass within the endocervical canal. Differential diagnosis includes prolapsing endometrial polyp or carcinoma, or primary cervical carcinoma. No evidence of pelvic metastatic disease. Electronically Signed   By: Marlaine Hind M.D.   On: 01/16/2021 10:50   NM PET Image Initial (PI) Skull Base To Thigh (F-18 FDG)  Result Date: 01/22/2021 CLINICAL DATA:  Initial treatment strategy for uterine/cervical cancer. EXAM: NUCLEAR MEDICINE PET SKULL BASE TO THIGH TECHNIQUE: 14.0 mCi F-18 FDG was injected intravenously. Full-ring PET imaging was performed from the skull base to thigh after the radiotracer. CT data was obtained and used for attenuation correction and anatomic localization. Fasting blood glucose: 105 mg/dl COMPARISON:  CT scan 10/01/2020 and MRI 01/15/2021 FINDINGS: Mediastinal blood pool activity: SUV max 1.96 Liver activity: SUV max NA NECK: No hypermetabolic lymph nodes in the neck. Incidental CT findings: none CHEST: No hypermetabolic mediastinal or hilar nodes. No suspicious pulmonary nodules on the CT scan. Incidental CT findings: none ABDOMEN/PELVIS: The large (12 x 10 cm) cystic/necrotic appearing right adnexal mass demonstrates areas of marked hypermetabolism with SUV max of 17.69. Extensive hypermetabolic tumor involving the endometrium and cervix with SUV max of 22.79. Scattered borderline retroperitoneal lymph nodes are mildly hypermetabolic. 9 mm node along the anterior aspect of the IVC on image 136/4 has  an SUV max of 4.05. Left-sided external iliac lymph node just lateral to the left ovary measures 10.5 mm and SUV max is 3.67 no evidence of omental disease or diffuse peritoneal carcinomatosis. Incidental CT findings: Moderate splenomegaly. SKELETON: No findings for osseous metastatic disease. Incidental CT findings: none IMPRESSION: 1. Large cystic/necrotic right adnexal mass is markedly hypermetabolic and consistent with known neoplasm. 2. Extensive hypermetabolic tumor involving the endometrium and cervix. 3. Scattered  borderline enlarged and mildly hypermetabolic retroperitoneal and external iliac nodes. 4. No findings for metastatic disease involving the chest or bony structures. Electronically Signed   By: Marijo Sanes M.D.   On: 01/22/2021 16:36    ASSESSMENT & PLAN:  Malignant neoplasm of cervix ([place]) I have reviewed her imaging studies, surgical report and pathology report The patient has cervical cancer, and her case was presented and reviewed at the GYN oncology tumor board Overall consensus would be to proceed with definitive concurrent chemoradiation therapy with weekly cisplatin We discussed the role of chemotherapy. The intent is of curative intent.  We discussed some of the risks, benefits, side-effects of cisplatin and its role as chemo sensitizing agent. The plan for weekly cisplatin for x5 doses along with radiation treatment.  Some of the short term side-effects included, though not limited to, including weight loss, life threatening infections, risk of allergic reactions, need for transfusions of blood products, nausea, vomiting, change in bowel habits, loss of hair, admission to hospital for various reasons, and risks of death.   Long term side-effects are also discussed including risks of infertility, permanent damage to nerve function, hearing loss, chronic fatigue, kidney damage with possibility needing hemodialysis, and rare secondary malignancy including bone marrow  disorders.  The patient is aware that the response rates discussed earlier is not guaranteed.  After a long discussion, patient made an informed decision to proceed with the prescribed plan of care.   Patient education material was dispensed. I warned the patient that she would likely be rendered infertile after her treatment We discussed briefly about the role of ovarian tissue cryopreservation for fertility but the patient is not interested I will schedule port placement, chemo education class with tentative start date for treatment on February 22, 2021    Iron deficiency anemia due to chronic blood loss She had history of severe iron deficiency anemia requiring blood transfusions and IV iron I plan to recheck iron studies again in her next visit  BMI 45.0-49.9, adult ([place]) Due to her BMI, the calculated dose of cisplatin is very high I will prescribe upfront dose reduction  Diabetes mellitus without complication ([place]) She denies peripheral neuropathy or other diabetic related complications We will monitor her blood sugar carefully while on treatment  Orders Placed This Encounter  Procedures   IR IMAGING GUIDED PORT INSERTION    Standing Status:   Future    Standing Expiration Date:   02/05/2022    Order Specific Question:   Reason for Exam (SYMPTOM  OR DIAGNOSIS REQUIRED)    Answer:   need port for chemo    Order Specific Question:   Is the patient pregnant?    Answer:   No    Order Specific Question:   Preferred Imaging Location?    Answer:   [facility]   CBC with Differential ([place] Only)    Standing Status:   Standing    Number of Occurrences:   20    Standing Expiration Date:   7/86/7672   Basic Metabolic Panel - [place] Only    Standing Status:   Standing    Number of Occurrences:   20    Standing Expiration Date:   02/05/2022   Magnesium    Standing Status:   Standing    Number of Occurrences:   20    Standing Expiration Date:   02/05/2022    Iron and Iron Binding Capacity ([facility] only)    Standing Status:  Standing    Number of Occurrences:   1    Standing Expiration Date:   02/06/2022   Ferritin    Standing Status:   Standing    Number of Occurrences:   1    Standing Expiration Date:   02/06/2022   Reticulocytes    Standing Status:   Standing    Number of Occurrences:   1    Standing Expiration Date:   02/06/2022    All questions were answered. The patient knows to call the clinic with any problems, questions or concerns. The total time spent in the appointment was 80 minutes encounter with patients including review of chart and various tests results, discussions about plan of care and coordination of care plan   [name], MD 02/06/2021 1:26 PM

## 2021-02-08 ENCOUNTER — Ambulatory Visit
Admission: RE | Admit: 2021-02-08 | Discharge: 2021-02-08 | Disposition: A | Discharge status: Home / Self Care | Payer: Medicaid Other | Source: Ambulatory Visit | Attending: Radiation Oncology | Admitting: Radiation Oncology

## 2021-02-08 ENCOUNTER — Other Ambulatory Visit: Payer: Self-pay

## 2021-02-08 DIAGNOSIS — C539 Malignant neoplasm of cervix uteri, unspecified: Secondary | ICD-10-CM | POA: Insufficient documentation

## 2021-02-11 ENCOUNTER — Other Ambulatory Visit: Payer: Self-pay | Admitting: Oncology

## 2021-02-11 NOTE — Progress Notes (Signed)
Gynecologic Oncology Multi-Disciplinary Disposition Conference Note  Date of the Conference: 02/11/2021  Patient Name: Alyssa Becker  Referring Provider: Dr. Nelda Marseille Primary GYN Oncologist: Dr. Berline Lopes Medical Oncologist: Dr. Alvy Bimler Radiation Oncologist: Dr. 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 Disposition is to concurrent chemotherapy and radiation to the pelvis only. Followed by repeat imaging and consideration for radiation to paraaortic nodes.   This Multidisciplinary conference took place involving physicians from [place], Medical Oncology, Radiation Oncology, Pathology, Radiology along with the Gynecologic Oncology Nurse Practitioner and Gynecologic Oncology Nurse Navigator.  Comprehensive assessment of the patient's malignancy, staging, need for surgery, chemotherapy, radiation therapy, and need for further testing were reviewed. Supportive measures, both inpatient and following discharge were also discussed. The recommended plan of care is documented. Greater than 35 minutes were spent correlating and coordinating this patient's care.

## 2021-02-12 ENCOUNTER — Other Ambulatory Visit: Payer: Self-pay | Admitting: Radiology

## 2021-02-12 ENCOUNTER — Telehealth: Payer: Self-pay | Admitting: Oncology

## 2021-02-12 NOTE — Telephone Encounter (Signed)
Called [place] and reminded her of the port placement appointment tomorrow with arrival at 7:00 am at [facility].  Also told her not to eat or drink anything after midnight tonight.  She verbalized understanding and agreement of appointment time and instructions.

## 2021-02-13 ENCOUNTER — Other Ambulatory Visit: Payer: Self-pay

## 2021-02-13 ENCOUNTER — Ambulatory Visit (HOSPITAL_COMMUNITY)
Admission: RE | Admit: 2021-02-13 | Discharge: 2021-02-13 | Disposition: A | Discharge status: Home / Self Care | Payer: Medicaid Other | Source: Ambulatory Visit | Attending: Hematology and Oncology | Admitting: Hematology and Oncology

## 2021-02-13 DIAGNOSIS — F32A Depression, unspecified: Secondary | ICD-10-CM | POA: Diagnosis not present

## 2021-02-13 DIAGNOSIS — F419 Anxiety disorder, unspecified: Secondary | ICD-10-CM | POA: Insufficient documentation

## 2021-02-13 DIAGNOSIS — C7960 Secondary malignant neoplasm of unspecified ovary: Secondary | ICD-10-CM | POA: Insufficient documentation

## 2021-02-13 DIAGNOSIS — Z6841 Body Mass Index (BMI) 40.0 and over, adult: Secondary | ICD-10-CM | POA: Insufficient documentation

## 2021-02-13 DIAGNOSIS — E119 Type 2 diabetes mellitus without complications: Secondary | ICD-10-CM | POA: Insufficient documentation

## 2021-02-13 DIAGNOSIS — C539 Malignant neoplasm of cervix uteri, unspecified: Secondary | ICD-10-CM | POA: Diagnosis present

## 2021-02-13 HISTORY — PX: IR IMAGING GUIDED PORT INSERTION: IMG5740

## 2021-02-13 LAB — GLUCOSE, CAPILLARY: Glucose-Capillary: 129 mg/dL — ABNORMAL HIGH (ref 70–99)

## 2021-02-13 LAB — HCG, SERUM, QUALITATIVE: Preg, Serum: NEGATIVE

## 2021-02-13 MED ORDER — HEPARIN SOD (PORK) LOCK FLUSH 100 UNIT/ML IV SOLN
INTRAVENOUS | Status: AC
  Filled 2021-02-13: qty 5

## 2021-02-13 MED ORDER — FENTANYL CITRATE (PF) 100 MCG/2ML IJ SOLN
INTRAMUSCULAR | Status: AC | PRN
  Administered 2021-02-13: 25 ug via INTRAVENOUS
  Administered 2021-02-13: 50 ug via INTRAVENOUS
  Administered 2021-02-13: 25 ug via INTRAVENOUS

## 2021-02-13 MED ORDER — MIDAZOLAM HCL 2 MG/2ML IJ SOLN
INTRAMUSCULAR | Status: AC
  Filled 2021-02-13: qty 2

## 2021-02-13 MED ORDER — LIDOCAINE-EPINEPHRINE 1 %-1:100000 IJ SOLN
INTRAMUSCULAR | Status: AC
  Filled 2021-02-13: qty 1

## 2021-02-13 MED ORDER — SODIUM CHLORIDE 0.9 % IV SOLN: INTRAVENOUS | Status: DC

## 2021-02-13 MED ORDER — CEFAZOLIN SODIUM-DEXTROSE 2-4 GM/100ML-% IV SOLN: 2.0000 g | INTRAVENOUS | Status: DC

## 2021-02-13 MED ORDER — LIDOCAINE-EPINEPHRINE 2 %-1:100000 IJ SOLN
INTRAMUSCULAR | Status: AC | PRN
  Administered 2021-02-13: 10 mL

## 2021-02-13 MED ORDER — FENTANYL CITRATE (PF) 100 MCG/2ML IJ SOLN
INTRAMUSCULAR | Status: AC
  Filled 2021-02-13: qty 2

## 2021-02-13 MED ORDER — MIDAZOLAM HCL 2 MG/2ML IJ SOLN
INTRAMUSCULAR | Status: AC | PRN
  Administered 2021-02-13: 1 mg via INTRAVENOUS
  Administered 2021-02-13 (×2): .5 mg via INTRAVENOUS

## 2021-02-13 NOTE — Progress Notes (Signed)
Reports unable to give urine sample to check for pregnancy. Blood drawn when IV started.

## 2021-02-13 NOTE — Progress Notes (Signed)
Patient discharged from interventional radiology nurses stations. Vitals stable at discharge, pain 2/10 at incision site. Discharge instructions provided with teachback. Patient has follow up with oncology MD next week. Patient is understanding of instructions. IV removed and patient taken to main tower to be picked up by cousin Alex.

## 2021-02-13 NOTE — Procedures (Signed)
Interventional Radiology Procedure Note  Procedure: Chest port placement  Indication: Cervical malignancy  Findings: Please refer to procedural dictation for full description.  Complications: None  EBL: < 10 mL  Miachel Roux, MD [phone]

## 2021-02-13 NOTE — H&P (Signed)
Chief Complaint: Patient was seen in consultation today for port-a-catheter placement   Referring Physician(s): [name]  Supervising Physician: Mir, Sharen Heck  Patient Status: [facility] - Out-pt  History of Present Illness: Alyssa Becker is a 26 y.o. female with a medical history significant for anxiety/depression, DM2, morbid obesity and a recent diagnosis of cervical cancer with ovarian metastases. She is s/p robotic-assisted laparoscopic bilateral salpingectomy, right oophorectomy, lysis of adhesions and right ureterolysis. Her oncology team is preparing her for chemotherapy and has requested port-a-catheter placement.   Past Medical History:  Diagnosis Date   Anemia    Anxiety    Asthma    Depression    Diabetes mellitus without complication ([place])    type 2   Headache    migraine   Morbid obesity ([place])    Ovarian mass     Past Surgical History:  Procedure Laterality Date   TONSILLECTOMY     XI ROBOTIC ASSISTED OOPHORECTOMY N/A 01/24/2021   Procedure: XI ROBOTIC ASSISTED RIGHT OOPHORECTOMY, OOPHOROPEXY OF LEFT OVARY;  Surgeon: [name], MD;  Location: [facility];  Service: Gynecology;  Laterality: N/A;   XI ROBOTIC ASSISTED SALPINGECTOMY Bilateral 01/24/2021   Procedure: XI ROBOTIC ASSISTED SALPINGECTOMY; RIGHT URETERAL LYSIS; LYSIS OF ADHESIONS;  Surgeon: [name], MD;  Location: [facility];  Service: Gynecology;  Laterality: Bilateral;    Allergies: Pineapple and Prednisone  Medications: Prior to Admission medications   Medication Sig Start Date End Date Taking? Authorizing Provider  albuterol (VENTOLIN HFA) 108 (90 Base) MCG/ACT inhaler Inhale 1-2 puffs into the lungs every 6 (six) hours as needed for wheezing or shortness of breath.   Yes [provider]  Ascorbic Acid (VITAMIN C PO) Take 1 tablet by mouth once a week.   Yes [provider]  Ferrous Sulfate (IRON PO) Take 1 tablet by mouth daily.   Yes [provider]   ibuprofen (ADVIL) 800 MG tablet Take 1 tablet (800 mg total) by mouth every 8 (eight) hours as needed for moderate pain. 01/25/21  Yes Cross, Melissa D, NP  metFORMIN (GLUCOPHAGE) 500 MG tablet Take 1 tablet (500 mg total) by mouth 2 (two) times daily with a meal. Patient taking differently: Take 500 mg by mouth at bedtime. 10/03/20  Yes Chancy Milroy, MD  propranolol (INDERAL) 40 MG tablet Take 40 mg by mouth daily as needed (migraines).   Yes [provider]  lidocaine-prilocaine (EMLA) cream Apply to affected area once Patient taking differently: Apply 1 application topically daily as needed (port access). 02/05/21   [name], MD  ondansetron (ZOFRAN) 8 MG tablet Take 1 tablet (8 mg total) by mouth every 8 (eight) hours as needed. 02/05/21   [name], MD  oxyCODONE (OXY IR/ROXICODONE) 5 MG immediate release tablet Take 1-2 tablets (5-10 mg total) by mouth every 4 (four) hours as needed for severe pain. For AFTER surgery pain, do not take and drive Patient not taking: Reported on 02/11/2021 01/25/21   Joylene John D, NP  prochlorperazine (COMPAZINE) 10 MG tablet Take 1 tablet (10 mg total) by mouth every 6 (six) hours as needed (Nausea or vomiting). 02/05/21   [name], MD  senna-docusate (SENOKOT-S) 8.6-50 MG tablet Take 2 tablets by mouth at bedtime. Do not take if having loose stools Patient not taking: Reported on 02/06/2021 01/25/21   Joylene John D, NP     Family History  Problem Relation Age of Onset   Asthma Mother    Diabetes Mother  Hypertension Mother    Breast cancer Mother    Asthma Father    Diabetes Father    Kidney disease Father    Diabetes Sister    Asthma Sister    Asthma Brother    Diabetes Brother    Cancer Maternal Aunt    Diabetes Maternal Uncle    Diabetes Maternal Grandmother    Hypertension Maternal Grandmother    Anemia Maternal Grandmother    Heart attack Paternal Grandfather    Cancer Paternal Grandfather    Colon cancer Neg Hx     Ovarian cancer Neg Hx    Endometrial cancer Neg Hx    Pancreatic cancer Neg Hx    Prostate cancer Neg Hx     Social History   Socioeconomic History   Marital status: Single    Spouse name: Not on file   Number of children: Not on file   Years of education: Not on file   Highest education level: Not on file  Occupational History   Not on file  Tobacco Use   Smoking status: Never   Smokeless tobacco: Never  Vaping Use   Vaping Use: Never used  Substance and Sexual Activity   Alcohol use: Never   Drug use: Never   Sexual activity: Not Currently    Birth control/protection: None    Comment: female partner  Other Topics Concern   Not on file  Social History Narrative   Not on file   Social Determinants of Health   Financial Resource Strain: Not on file  Food Insecurity: Not on file  Transportation Needs: Not on file  Physical Activity: Not on file  Stress: Not on file  Social Connections: Not on file    Review of Systems: A 12 point ROS discussed and pertinent positives are indicated in the HPI above.  All other systems are negative.  Review of Systems  Constitutional:  Negative for appetite change and fatigue.  Respiratory:  Negative for cough and shortness of breath.   Cardiovascular:  Negative for chest pain and leg swelling.  Gastrointestinal:  Negative for abdominal pain, diarrhea, nausea and vomiting.  Neurological:  Negative for dizziness and headaches.   Vital Signs: BP 130/76 (BP Location: Right Arm)    Pulse 88    Temp 98 F (36.7 C) (Oral)    Ht 5\' 6"  (1.676 m)    LMP 01/23/2021 (Approximate)    SpO2 100%    BMI 45.52 kg/m   Physical Exam Constitutional:      General: She is not in acute distress.    Appearance: She is obese. She is not ill-appearing.  HENT:     Mouth/Throat:     Mouth: Mucous membranes are moist.     Pharynx: Oropharynx is clear.     Comments: Tongue and lip rings Cardiovascular:     Rate and Rhythm: Normal rate and regular  rhythm.     Pulses: Normal pulses.     Heart sounds: Normal heart sounds.  Pulmonary:     Effort: Pulmonary effort is normal.     Breath sounds: Normal breath sounds.  Abdominal:     General: Bowel sounds are normal.     Palpations: Abdomen is soft.     Tenderness: There is no abdominal tenderness.     Comments: Patient states she has 5 laparoscopic incisions that are healed and without drainage/erythema.   Skin:    General: Skin is warm and dry.  Neurological:     Mental Status:  She is alert and oriented to person, place, and time.  Psychiatric:        Mood and Affect: Mood normal.        Behavior: Behavior normal.        Thought Content: Thought content normal.        Judgment: Judgment normal.    Imaging: MR PELVIS W WO CONTRAST  Result Date: 01/16/2021 CLINICAL DATA:  Right adnexal mass and cervical mass on recent CT. EXAM: MRI PELVIS WITHOUT AND WITH CONTRAST TECHNIQUE: Multiplanar multisequence MR imaging of the pelvis was performed both before and after administration of intravenous contrast. CONTRAST:  33mL GADAVIST GADOBUTROL 1 MMOL/ML IV SOLN COMPARISON:  CT on 10/01/2020 FINDINGS: Lower Urinary Tract: No urinary bladder or urethral abnormality identified. Bowel: Unremarkable pelvic bowel loops. Vascular/Lymphatic: Unremarkable. No pathologically enlarged pelvic lymph nodes identified. Reproductive: -- Uterus: Measures 9.6 x 5.0 by 5.6 cm (volume = 140 cm^3). No fibroids identified. Endometrium measures 9 mm in thickness and shows heterogeneous T2 signal intensity and mild contrast enhancement. A soft tissue mass is seen within the endocervical canal which measures 4.5 x 4.1 cm. This mass shows no definite evidence of cervical stromal involvement, but does involve the endometrium and myometrium in the lower uterine segment. Differential diagnosis includes prolapsing endometrial polyp or carcinoma, or primary cervical carcinoma. There is no evidence of parametrial or vaginal  involvement. -- Right ovary: A large cystic and solid mass is seen in the right upper pelvis, arising from the right ovary. This has numerous enhancing mural soft tissue nodules and measures 13.0 x 10.1 by 10.5 cm. This is highly suspicious for ovarian carcinoma. -- Left ovary: Appears normal. No ovarian or adnexal masses identified. Other: No peritoneal thickening or abnormal free fluid. Musculoskeletal:  Unremarkable. IMPRESSION: 13.0 cm cystic and solid mass arising from the right ovary, highly suspicious for ovarian carcinoma. Mild endometrial thickening and 4.5 cm soft tissue mass within the endocervical canal. Differential diagnosis includes prolapsing endometrial polyp or carcinoma, or primary cervical carcinoma. No evidence of pelvic metastatic disease. Electronically Signed   By: Marlaine Hind M.D.   On: 01/16/2021 10:50   NM PET Image Initial (PI) Skull Base To Thigh (F-18 FDG)  Result Date: 01/22/2021 CLINICAL DATA:  Initial treatment strategy for uterine/cervical cancer. EXAM: NUCLEAR MEDICINE PET SKULL BASE TO THIGH TECHNIQUE: 14.0 mCi F-18 FDG was injected intravenously. Full-ring PET imaging was performed from the skull base to thigh after the radiotracer. CT data was obtained and used for attenuation correction and anatomic localization. Fasting blood glucose: 105 mg/dl COMPARISON:  CT scan 10/01/2020 and MRI 01/15/2021 FINDINGS: Mediastinal blood pool activity: SUV max 1.96 Liver activity: SUV max NA NECK: No hypermetabolic lymph nodes in the neck. Incidental CT findings: none CHEST: No hypermetabolic mediastinal or hilar nodes. No suspicious pulmonary nodules on the CT scan. Incidental CT findings: none ABDOMEN/PELVIS: The large (12 x 10 cm) cystic/necrotic appearing right adnexal mass demonstrates areas of marked hypermetabolism with SUV max of 17.69. Extensive hypermetabolic tumor involving the endometrium and cervix with SUV max of 22.79. Scattered borderline retroperitoneal lymph nodes are  mildly hypermetabolic. 9 mm node along the anterior aspect of the IVC on image 136/4 has an SUV max of 4.05. Left-sided external iliac lymph node just lateral to the left ovary measures 10.5 mm and SUV max is 3.67 no evidence of omental disease or diffuse peritoneal carcinomatosis. Incidental CT findings: Moderate splenomegaly. SKELETON: No findings for osseous metastatic disease. Incidental CT findings: none IMPRESSION: 1.  Large cystic/necrotic right adnexal mass is markedly hypermetabolic and consistent with known neoplasm. 2. Extensive hypermetabolic tumor involving the endometrium and cervix. 3. Scattered borderline enlarged and mildly hypermetabolic retroperitoneal and external iliac nodes. 4. No findings for metastatic disease involving the chest or bony structures. Electronically Signed   By: Marijo Sanes M.D.   On: 01/22/2021 16:36    Labs:  CBC: Recent Labs    01/18/21 0855 01/22/21 1054 01/24/21 0620 01/25/21 0438  WBC 10.5 9.6 11.2* 14.2*  HGB 7.7* 7.8* 9.8* 9.3*  HCT 30.0* 29.1* 35.6* 34.8*  PLT 442* 412* 386 408*    COAGS: Recent Labs    10/02/20 0804  INR 1.1    BMP: Recent Labs    10/01/20 0903 10/02/20 0140 01/18/21 0855 01/25/21 0438  NA 135 134* 136 140  K 3.9 3.7 4.1 4.3  CL 101 103 104 105  CO2 26 22 24 24   GLUCOSE 132* 129* 108* 108*  BUN 13 10 14 12   CALCIUM 9.0 8.7* 8.8* 9.4  CREATININE 0.59 0.68 0.58 0.59  GFRNONAA >60 >60 >60 >60    LIVER FUNCTION TESTS: Recent Labs    02/16/20 2157 10/01/20 0903 10/02/20 0140 01/18/21 0855  BILITOT 0.3 0.4 0.5 0.3  AST 21 16 16  12*  ALT 19 11 11 11   ALKPHOS 99 129* 110 92  PROT 7.9 7.8 6.6 8.1  ALBUMIN 4.1 4.1 3.4* 4.3    TUMOR MARKERS: No results for input(s): AFPTM, CEA, CA199, CHROMGRNA in the last 8760 hours.  Assessment and Plan:  Cervical cancer; chemotherapy: Alyssa Becker, 26 year old female, presents today to the [facility] department for an image-guided  port-a-catheter placement.  Risks and benefits of image-guided Port-a-catheter placement were discussed with the patient including, but not limited to bleeding, infection, pneumothorax, or fibrin sheath development and need for additional procedures.  All of the patient's questions were answered, patient is agreeable to proceed. She has been NPO.   Consent signed and in chart.  Thank you for this interesting consult.  I greatly enjoyed meeting Alyssa Becker and look forward to participating in their care.  A copy of this report was sent to the requesting provider on this date.  Electronically Signed: Soyla Dryer, AGACNP-BC [phone] 02/13/2021, 8:30 AM   I spent a total of  30 Minutes   in face to face in clinical consultation, greater than 50% of which was counseling/coordinating care for port-a-catheter placement.

## 2021-02-15 NOTE — Progress Notes (Signed)
Pharmacist Chemotherapy Monitoring - Initial Assessment    Anticipated start date: 02/22/21   The following has been reviewed per standard work regarding the patient's treatment regimen: The patient's diagnosis, treatment plan and drug doses, and organ/hematologic function Lab orders and baseline tests specific to treatment regimen  The treatment plan start date, drug sequencing, and pre-medications Prior authorization status  Patient's documented medication list, including drug-drug interaction screen and prescriptions for anti-emetics and supportive care specific to the treatment regimen The drug concentrations, fluid compatibility, administration routes, and timing of the medications to be used The patient's access for treatment and lifetime cumulative dose history, if applicable  The patient's medication allergies and previous infusion related reactions, if applicable   Changes made to treatment plan:  N/A  Follow up needed:  Pending authorization for treatment    Larene Beach, [place], 02/15/2021  3:19 PM

## 2021-02-18 ENCOUNTER — Inpatient Hospital Stay: Payer: Medicaid Other

## 2021-02-18 ENCOUNTER — Encounter: Payer: Self-pay | Admitting: Physician Assistant

## 2021-02-18 ENCOUNTER — Inpatient Hospital Stay (HOSPITAL_BASED_OUTPATIENT_CLINIC_OR_DEPARTMENT_OTHER): Payer: Medicaid Other | Admitting: Gynecologic Oncology

## 2021-02-18 ENCOUNTER — Other Ambulatory Visit: Payer: Self-pay

## 2021-02-18 ENCOUNTER — Telehealth: Payer: Self-pay | Admitting: Oncology

## 2021-02-18 ENCOUNTER — Encounter: Payer: Self-pay | Admitting: Gynecologic Oncology

## 2021-02-18 VITALS — BP 141/65 | HR 98 | Temp 97.7°F | Resp 18 | Ht 66.0 in | Wt 279.0 lb

## 2021-02-18 DIAGNOSIS — Z90721 Acquired absence of ovaries, unilateral: Secondary | ICD-10-CM

## 2021-02-18 DIAGNOSIS — C539 Malignant neoplasm of cervix uteri, unspecified: Secondary | ICD-10-CM | POA: Diagnosis not present

## 2021-02-18 DIAGNOSIS — Z6841 Body Mass Index (BMI) 40.0 and over, adult: Secondary | ICD-10-CM

## 2021-02-18 DIAGNOSIS — Z9079 Acquired absence of other genital organ(s): Secondary | ICD-10-CM

## 2021-02-18 DIAGNOSIS — D500 Iron deficiency anemia secondary to blood loss (chronic): Secondary | ICD-10-CM

## 2021-02-18 DIAGNOSIS — N838 Other noninflammatory disorders of ovary, fallopian tube and broad ligament: Secondary | ICD-10-CM

## 2021-02-18 DIAGNOSIS — Z7189 Other specified counseling: Secondary | ICD-10-CM

## 2021-02-18 LAB — BASIC METABOLIC PANEL - CANCER CENTER ONLY
Anion gap: 6 (ref 5–15)
BUN: 13 mg/dL (ref 6–20)
CO2: 26 mmol/L (ref 22–32)
Calcium: 9.2 mg/dL (ref 8.9–10.3)
Chloride: 105 mmol/L (ref 98–111)
Creatinine: 0.56 mg/dL (ref 0.44–1.00)
GFR, Estimated: >60 mL/min (ref >60)
Glucose, Bld: 132 mg/dL — ABNORMAL HIGH (ref 70–99)
Potassium: 3.9 mmol/L (ref 3.5–5.1)
Sodium: 137 mmol/L (ref 135–145)

## 2021-02-18 LAB — CBC WITH DIFFERENTIAL (CANCER CENTER ONLY)
Abs Immature Granulocytes: 0.04 10*3/uL (ref 0.00–0.07)
Basophils Absolute: 0 10*3/uL (ref 0.0–0.1)
Basophils Relative: 0 %
Eosinophils Absolute: 0.2 10*3/uL (ref 0.0–0.5)
Eosinophils Relative: 2 %
HCT: 29.5 % — ABNORMAL LOW (ref 36.0–46.0)
Hemoglobin: 7.9 g/dL — ABNORMAL LOW (ref 12.0–15.0)
Immature Granulocytes: 1 %
Lymphocytes Relative: 27 %
Lymphs Abs: 2.2 10*3/uL (ref 0.7–4.0)
MCH: 16.8 pg — ABNORMAL LOW (ref 26.0–34.0)
MCHC: 26.8 g/dL — ABNORMAL LOW (ref 30.0–36.0)
MCV: 62.6 fL — ABNORMAL LOW (ref 80.0–100.0)
Monocytes Absolute: 0.6 10*3/uL (ref 0.1–1.0)
Monocytes Relative: 8 %
Neutro Abs: 5.1 10*3/uL (ref 1.7–7.7)
Neutrophils Relative %: 62 %
Platelet Count: 375 10*3/uL (ref 150–400)
RBC: 4.71 MIL/uL (ref 3.87–5.11)
RDW: 22.5 % — ABNORMAL HIGH (ref 11.5–15.5)
WBC Count: 8.2 10*3/uL (ref 4.0–10.5)
nRBC: 0 % (ref 0.0–0.2)

## 2021-02-18 LAB — IRON AND IRON BINDING CAPACITY (CC-WL,HP ONLY)
Iron: 22 ug/dL — ABNORMAL LOW (ref 28–170)
Saturation Ratios: 6 % — ABNORMAL LOW (ref 10.4–31.8)
TIBC: 384 ug/dL (ref 250–450)
UIBC: 362 ug/dL (ref 148–442)

## 2021-02-18 LAB — RETICULOCYTES
Immature Retic Fract: 28 % — ABNORMAL HIGH (ref 2.3–15.9)
RBC.: 4.65 MIL/uL (ref 3.87–5.11)
Retic Count, Absolute: 105.1 10*3/uL (ref 19.0–186.0)
Retic Ct Pct: 2.3 % (ref 0.4–3.1)

## 2021-02-18 LAB — HIV ANTIBODY (ROUTINE TESTING W REFLEX): HIV Screen 4th Generation wRfx: NONREACTIVE

## 2021-02-18 LAB — FERRITIN: Ferritin: 10 ng/mL — ABNORMAL LOW (ref 11–307)

## 2021-02-18 LAB — MAGNESIUM: Magnesium: 2 mg/dL (ref 1.7–2.4)

## 2021-02-18 NOTE — Patient Instructions (Signed)
It was good to see you today.  You are healing very well from surgery.  I will see you back after you finish chemotherapy and radiation.

## 2021-02-18 NOTE — Telephone Encounter (Signed)
Called [facility] and rescheduled labs to 2:45 today before Dr. Charisse March appointment.  She verbalized understanding and agreement.

## 2021-02-18 NOTE — Progress Notes (Signed)
Patient called regarding [employer]. Advised what is needed to apply.  She will bring this afternoon at appointment.  She has my card for any additional financial questions or concerns.

## 2021-02-18 NOTE — Progress Notes (Signed)
Met with patient at registration to complete grant process.  Patient approved for one-time $1000 [employer] grant to assist with personal expenses while going through treatment. Discussed in detail expenses and how they are covered. She received a gift card today from her grant.  She has copies of the approval letter and expense sheet along with the Outpatient Pharmacy information in a green folder and my card for any additional financial questions or concerns.

## 2021-02-18 NOTE — Progress Notes (Signed)
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 Large indeterminate right adnexal mass with solid hypervascular and cystic components. This appears separate from the base of the appendix on preceding CT and is likely arising from the right ovary based on relationship to the gonadal vessels on CT. Given the patient's young age, hypervascularity and leukocytosis, findings could represent an atypical tubo-ovarian abscess. However, the appearance is concerning for ovarian malignancy despite the patient's young age. Recommend prompt gynecology consultation. Pelvic MRI without and with contrast may be helpful for further evaluation. 2. The left ovary appears normal. Mild nonspecific thickening of the endometrium, within physiologic limits.   01/02/2021 Pathology Results   SPECIMEN ADEQUACY: Satisfactory for evaluation; transformation zone component PRESENT. INTERPRETATION: - Adenocarcinoma, NOS   01/15/2021 Imaging   MRI pelvis  13.0 cm cystic and solid mass arising from the right ovary, highly suspicious for ovarian carcinoma.   Mild endometrial thickening and 4.5 cm soft tissue mass within the endocervical canal. Differential diagnosis includes prolapsing endometrial polyp or carcinoma, or primary cervical carcinoma.   No evidence of pelvic metastatic disease.   01/16/2021 Initial Diagnosis   Malignant neoplasm of cervix ([place])   01/22/2021 PET scan   1. Large cystic/necrotic right adnexal mass is markedly hypermetabolic and consistent with known neoplasm. 2. Extensive hypermetabolic tumor involving the endometrium and cervix. 3. Scattered borderline enlarged and mildly hypermetabolic  retroperitoneal and external iliac nodes. 4. No findings for metastatic disease involving the chest or bony structures.   01/24/2021 Surgery   Pre-operative Diagnosis: Complex adnexal mass, elevated CA-125, locally invasive cervical adenocarcinoma versus endometrial adenocarcinoma   Post-operative Diagnosis: same, high-grade carcinoma of the right ovary, suspected history of superinfection of the adnexal mass   Operation: Robotic-assisted laparoscopic bilateral salpingectomy, right oophorectomy, lysis of adhesions of approximately 45 minutes, right ureterolysis   Surgeon: Jeral Pinch MD    Operative Findings: On EUA, cervix enlarged and firm, fleshy tumor replacing the endocervix.  No definitive parametrial involvement appreciated.  Even gentle exam because significant bleeding so vagina was packed with lap sponges for the remainder of surgery.  On intra-abdominal exam, normal upper abdominal survey including omentum, liver edge, diaphragm, and stomach.  Normal small bowel.  Some physiologic adhesions of the sigmoid epiploica to the left sidewall.  Normal-appearing left ovary measuring approximately 3-4 cm.  Normal-appearing bilateral fallopian tubes.  Uterus approximately 8-10 cm and overall normal in appearance.  Right ovary replaced by a smooth but vascular 10 cm mass with multiple loculations.  Ovary with dense adhesions to the right pelvic sidewall.  Upon very careful manipulation of the mass for its removal, there was some drainage of fluid noted that was somewhat purulent.  This was sent for culture and Gram stain.  Upon contained mass removal with fractionation of the mass, tumor versus necrotic material was noted.  Frozen section consistent with high-grade carcinoma, unable to differentiate on frozen section whether this is the same cancer being seen on her cervical biopsies.  Right ureterolysis performed given oozing services from the broad ligament and peritoneum where adhesions were lysed  between the ovary and peritoneum, to assure ureter not in close proximity.  No obvious adenopathy.  No ascites.  Left ovary transposed out of the  pelvis along the left gutter, clips placed along the sidewall as well as the superior and inferior aspect of the ovary.     01/24/2021 Pathology Results   FINAL MICROSCOPIC DIAGNOSIS:   A. OVARY AND FALLOPIAN TUBE, RIGHT, SALPINGO OOPHORECTOMY:  - Endometrioid carcinoma, moderately differentiated, involving right ovary  - No evidence of ovarian surface involvement by carcinoma  - Segment of fallopian tube, negative for carcinoma  - See oncology table  - See comment   B. FALLOPIAN TUBE, LEFT, SALPINGECTOMY:  - Segment of fallopian tube, negative for carcinoma   A.  Immunostain for p16 shows patchy staining.  Immunostain for p53 shows a normal, wild-type expression pattern.  This immunoprofile is consistent with above interpretation.  Patient's previous cervical  biopsy was reviewed - it shows moderately differentiated carcinoma with a very similar histomorphology and very likely presents metastasis from the ovarian tumor to the cervix.    02/05/2021 Cancer Staging   Staging form: Cervix Uteri, AJCC Version 9 - Pathologic stage from 02/05/2021: FIGO Stage II (pT2, pN0, cM0) - Signed by [name], MD on 02/05/2021 Stage prefix: Initial diagnosis    02/14/2021 Procedure   Successful placement of a right internal jugular approach power injectable Port-A-Cath. The catheter is ready for immediate use   02/22/2021 -  Chemotherapy   Patient is on Treatment Plan : Cervical cancer Cisplatin q7d       Interval History: Patient presents today for follow-up after surgery.  She is overall doing very well from a postoperative standpoint.  She comes in with her cousin today.  She endorses a good appetite without any nausea or emesis.  Reports regular bowel and bladder function.  Denies any significant abdominal pain.  Had port placed last week.  Past  Medical/Surgical History: Past Medical History:  Diagnosis Date   Anemia    Anxiety    Asthma    Depression    Diabetes mellitus without complication ([place])    type 2   Headache    migraine   Morbid obesity ([place])    Ovarian mass     Past Surgical History:  Procedure Laterality Date   IR IMAGING GUIDED PORT INSERTION  02/13/2021   TONSILLECTOMY     XI ROBOTIC ASSISTED OOPHORECTOMY N/A 01/24/2021   Procedure: XI ROBOTIC ASSISTED RIGHT OOPHORECTOMY, OOPHOROPEXY OF LEFT OVARY;  Surgeon: [name], MD;  Location: [facility];  Service: Gynecology;  Laterality: N/A;   XI ROBOTIC ASSISTED SALPINGECTOMY Bilateral 01/24/2021   Procedure: XI ROBOTIC ASSISTED SALPINGECTOMY; RIGHT URETERAL LYSIS; LYSIS OF ADHESIONS;  Surgeon: [name], MD;  Location: [facility];  Service: Gynecology;  Laterality: Bilateral;    Family History  Problem Relation Age of Onset   Asthma Mother    Diabetes Mother    Hypertension Mother    Breast cancer Mother    Asthma Father    Diabetes Father    Kidney disease Father    Diabetes Sister    Asthma Sister    Asthma Brother    Diabetes Brother    Cancer Maternal Aunt    Diabetes Maternal Uncle    Diabetes Maternal Grandmother    Hypertension Maternal Grandmother    Anemia Maternal Grandmother    Heart attack Paternal Grandfather    Cancer Paternal Grandfather    Colon cancer Neg Hx    Ovarian cancer Neg Hx    Endometrial cancer Neg Hx    Pancreatic cancer Neg Hx    Prostate cancer Neg Hx  Social History   Socioeconomic History   Marital status: Single    Spouse name: Not on file   Number of children: Not on file   Years of education: Not on file   Highest education level: Not on file  Occupational History   Not on file  Tobacco Use   Smoking status: Never   Smokeless tobacco: Never  Vaping Use   Vaping Use: Never used  Substance and Sexual Activity   Alcohol use: Never   Drug use: Never   Sexual activity: Not Currently     Birth control/protection: None    Comment: female partner  Other Topics Concern   Not on file  Social History Narrative   Not on file   Social Determinants of Health   Financial Resource Strain: Not on file  Food Insecurity: Not on file  Transportation Needs: Not on file  Physical Activity: Not on file  Stress: Not on file  Social Connections: Not on file    Current Medications:  Current Outpatient Medications:    albuterol (VENTOLIN HFA) 108 (90 Base) MCG/ACT inhaler, Inhale 1-2 puffs into the lungs every 6 (six) hours as needed for wheezing or shortness of breath., Disp: , Rfl:    Ascorbic Acid (VITAMIN C PO), Take 1 tablet by mouth once a week., Disp: , Rfl:    Ferrous Sulfate (IRON PO), Take 1 tablet by mouth daily., Disp: , Rfl:    metFORMIN (GLUCOPHAGE) 500 MG tablet, Take 1 tablet (500 mg total) by mouth 2 (two) times daily with a meal. (Patient taking differently: Take 500 mg by mouth at bedtime.), Disp: 60 tablet, Rfl: 1   propranolol (INDERAL) 40 MG tablet, Take 40 mg by mouth daily as needed (migraines)., Disp: , Rfl:    ibuprofen (ADVIL) 800 MG tablet, Take 1 tablet (800 mg total) by mouth every 8 (eight) hours as needed for moderate pain. (Patient not taking: Reported on 02/18/2021), Disp: 30 tablet, Rfl: 1   lidocaine-prilocaine (EMLA) cream, Apply to affected area once (Patient not taking: Reported on 02/18/2021), Disp: 30 g, Rfl: 3   ondansetron (ZOFRAN) 8 MG tablet, Take 1 tablet (8 mg total) by mouth every 8 (eight) hours as needed. (Patient not taking: Reported on 02/18/2021), Disp: 30 tablet, Rfl: 1   oxyCODONE (OXY IR/ROXICODONE) 5 MG immediate release tablet, Take 1-2 tablets (5-10 mg total) by mouth every 4 (four) hours as needed for severe pain. For AFTER surgery pain, do not take and drive (Patient not taking: Reported on 02/11/2021), Disp: 15 tablet, Rfl: 0   prochlorperazine (COMPAZINE) 10 MG tablet, Take 1 tablet (10 mg total) by mouth every 6 (six) hours as  needed (Nausea or vomiting). (Patient not taking: Reported on 02/18/2021), Disp: 30 tablet, Rfl: 1   senna-docusate (SENOKOT-S) 8.6-50 MG tablet, Take 2 tablets by mouth at bedtime. Do not take if having loose stools (Patient not taking: Reported on 02/06/2021), Disp: 30 tablet, Rfl: 0  Review of Systems: + hot flashes, headache Denies appetite changes, fevers, chills, fatigue, unexplained weight changes. Denies hearing loss, neck lumps or masses, mouth sores, ringing in ears or voice changes. Denies cough or wheezing.  Denies shortness of breath. Denies chest pain or palpitations. Denies leg swelling. Denies abdominal distention, pain, blood in stools, constipation, diarrhea, nausea, vomiting, or early satiety. Denies pain with intercourse, dysuria, frequency, hematuria or incontinence. Denies pelvic pain, vaginal bleeding or vaginal discharge.   Denies joint pain, back pain or muscle pain/cramps. Denies itching, rash, or wounds.  Denies dizziness, numbness or seizures. Denies swollen lymph nodes or glands, denies easy bruising or bleeding. Denies anxiety, depression, confusion, or decreased concentration.  Physical Exam: BP (!) 141/65 (BP Location: Left Arm, Patient Position: Sitting)    Pulse 98    Temp 97.7 F (36.5 C) (Tympanic)    Resp 18    Ht '5\' 6"'  (1.676 m)    Wt 279 lb (126.6 kg)    LMP 01/23/2021 (Approximate)    SpO2 100%    BMI 45.03 kg/m  General: Alert, oriented, no acute distress. HEENT: Normocephalic, atraumatic, sclera anicteric. Chest: Unlabored breathing on room air. Abdomen: Obese, soft, nontender.  Normoactive bowel sounds.  No masses or hepatosplenomegaly appreciated.  Well-healed incisions, remaining Dermabond removed.   Extremities: Grossly normal range of motion.  Warm, well perfused.  No edema bilaterally.  Laboratory & Radiologic Studies: None new  Assessment & Plan: Alyssa Becker is a 26 y.o. woman with Stage IVB adenocarcinoma of the cervix who presents  for follow-up after recent robotic unilateral oophorectomy and bilateral salpingectomy with final pathology revealing metastatic disease to the ovary.  Patient appears to be doing very well from a postoperative standpoint.  Discussed continued expectations and postoperative restrictions.  I spent some reviewing with the patient and her cousin all of the testing that we have thus far.  I discussed in detail the difficulty between distinguishing the organ of origin of her cancer.  After discussion at our tumor board, we feel most comfortable moving forward under the presumption that this is metastatic cervix cancer.  I think it would be highly unusual for an endometrioid ovarian cancer to have metastasized to the cervix and the manner that hers has.  There is no testing that will definitively say whether this is ovarian or cervical cancer.  I have asked pathology to add PD-L1 testing.  Patient will be starting definitive chemoradiation.  We will plan to reassess disease status with imaging after she finishes treatment.  We discussed the one ovary that she has left in situ.  Given its normal appearance at the time of surgery as well as non-FDG-avid on her PET scan, I made the decision to leave it in situ.  We may, at some point, have to revisit surgical removal, but given her young age, surgical menopause would not be without significant risk to her as well as symptomatology.  All of the patient and her family's questions were answered today.  32 minutes of total time was spent for this patient encounter, including preparation, face-to-face counseling with the patient and coordination of care, and documentation of the encounter.  Jeral Pinch, MD  Division of Gynecologic Oncology  Department of Obstetrics and Gynecology  [facility]

## 2021-02-18 NOTE — Telephone Encounter (Signed)
Requested PD-L1 testing on accession [id number] with [facility] via email.

## 2021-02-19 ENCOUNTER — Other Ambulatory Visit: Payer: Self-pay | Admitting: Hematology and Oncology

## 2021-02-20 ENCOUNTER — Inpatient Hospital Stay: Payer: Medicaid Other | Admitting: Physician Assistant

## 2021-02-20 ENCOUNTER — Inpatient Hospital Stay: Payer: Medicaid Other

## 2021-02-20 DIAGNOSIS — C539 Malignant neoplasm of cervix uteri, unspecified: Secondary | ICD-10-CM | POA: Insufficient documentation

## 2021-02-21 MED FILL — Dexamethasone Sodium Phosphate Inj 100 MG/10ML: INTRAMUSCULAR | Qty: 1 | Status: AC

## 2021-02-21 MED FILL — Fosaprepitant Dimeglumine For IV Infusion 150 MG (Base Eq): INTRAVENOUS | Qty: 5 | Status: AC

## 2021-02-22 ENCOUNTER — Encounter: Payer: Self-pay | Admitting: Hematology and Oncology

## 2021-02-22 ENCOUNTER — Inpatient Hospital Stay: Payer: Medicaid Other | Admitting: Hematology and Oncology

## 2021-02-22 ENCOUNTER — Other Ambulatory Visit: Payer: Self-pay

## 2021-02-22 ENCOUNTER — Inpatient Hospital Stay: Payer: Medicaid Other

## 2021-02-22 VITALS — BP 149/74 | HR 93 | Temp 98.0°F | Resp 18 | Wt 284.6 lb

## 2021-02-22 DIAGNOSIS — C539 Malignant neoplasm of cervix uteri, unspecified: Secondary | ICD-10-CM | POA: Insufficient documentation

## 2021-02-22 DIAGNOSIS — R197 Diarrhea, unspecified: Secondary | ICD-10-CM | POA: Insufficient documentation

## 2021-02-22 DIAGNOSIS — D500 Iron deficiency anemia secondary to blood loss (chronic): Secondary | ICD-10-CM | POA: Insufficient documentation

## 2021-02-22 DIAGNOSIS — R300 Dysuria: Secondary | ICD-10-CM | POA: Insufficient documentation

## 2021-02-22 DIAGNOSIS — E119 Type 2 diabetes mellitus without complications: Secondary | ICD-10-CM | POA: Insufficient documentation

## 2021-02-22 DIAGNOSIS — C7961 Secondary malignant neoplasm of right ovary: Secondary | ICD-10-CM | POA: Insufficient documentation

## 2021-02-22 DIAGNOSIS — R634 Abnormal weight loss: Secondary | ICD-10-CM | POA: Insufficient documentation

## 2021-02-22 DIAGNOSIS — D72829 Elevated white blood cell count, unspecified: Secondary | ICD-10-CM | POA: Insufficient documentation

## 2021-02-22 DIAGNOSIS — D509 Iron deficiency anemia, unspecified: Secondary | ICD-10-CM

## 2021-02-22 MED ORDER — MAGNESIUM SULFATE 2 GM/50ML IV SOLN
2.0000 g | Freq: Once | INTRAVENOUS | Status: AC
  Administered 2021-02-22: 2 g via INTRAVENOUS
  Filled 2021-02-22: qty 50

## 2021-02-22 MED ORDER — SODIUM CHLORIDE 0.9 % IV SOLN
510.0000 mg | Freq: Once | INTRAVENOUS | Status: AC
  Administered 2021-02-22: 510 mg via INTRAVENOUS
  Filled 2021-02-22: qty 510

## 2021-02-22 MED ORDER — POTASSIUM CHLORIDE IN NACL 20-0.9 MEQ/L-% IV SOLN
Freq: Once | INTRAVENOUS | Status: AC
  Filled 2021-02-22: qty 1000

## 2021-02-22 MED ORDER — HEPARIN SOD (PORK) LOCK FLUSH 100 UNIT/ML IV SOLN
500.0000 [IU] | Freq: Once | INTRAVENOUS | Status: AC | PRN
  Administered 2021-02-22: 500 [IU]

## 2021-02-22 MED ORDER — SODIUM CHLORIDE 0.9% FLUSH
10.0000 mL | INTRAVENOUS | Status: DC | PRN
  Administered 2021-02-22: 10 mL

## 2021-02-22 MED ORDER — PALONOSETRON HCL INJECTION 0.25 MG/5ML
0.2500 mg | Freq: Once | INTRAVENOUS | Status: AC
  Administered 2021-02-22: 0.25 mg via INTRAVENOUS
  Filled 2021-02-22: qty 5

## 2021-02-22 MED ORDER — SODIUM CHLORIDE 0.9 % IV SOLN
150.0000 mg | Freq: Once | INTRAVENOUS | Status: AC
  Administered 2021-02-22: 150 mg via INTRAVENOUS
  Filled 2021-02-22: qty 150

## 2021-02-22 MED ORDER — SODIUM CHLORIDE 0.9 % IV SOLN: Freq: Once | INTRAVENOUS | Status: AC

## 2021-02-22 MED ORDER — SODIUM CHLORIDE 0.9 % IV SOLN
32.0000 mg/m2 | Freq: Once | INTRAVENOUS | Status: AC
  Administered 2021-02-22: 79 mg via INTRAVENOUS
  Filled 2021-02-22: qty 79

## 2021-02-22 MED ORDER — SODIUM CHLORIDE 0.9 % IV SOLN
10.0000 mg | Freq: Once | INTRAVENOUS | Status: AC
  Administered 2021-02-22: 10 mg via INTRAVENOUS
  Filled 2021-02-22: qty 10

## 2021-02-22 NOTE — Progress Notes (Signed)
[facility] OFFICE PROGRESS NOTE  Patient Care Team: [facility] Dept Personal as PCP - General [name], Craige Cotta, RN as Oncology Nurse Navigator (Oncology)  ASSESSMENT & PLAN:  Malignant neoplasm of cervix [facility]) She will start first dose treatment today Due to severe iron deficiency anemia, we will also provide IV iron support  Iron deficiency anemia due to chronic blood loss The most likely cause of her anemia is due to chronic blood loss/malabsorption syndrome. We discussed some of the risks, benefits, and alternatives of intravenous iron infusions. The patient is symptomatic from anemia and the iron level is critically low. She tolerated oral iron supplement poorly and desires to achieved higher levels of iron faster for adequate hematopoesis. Some of the side-effects to be expected including risks of infusion reactions, phlebitis, headaches, nausea and fatigue.  The patient is willing to proceed. Patient education material was dispensed.  Goal is to keep ferritin level greater than 50 and resolution of anemia   No orders of the defined types were placed in this encounter.   All questions were answered. The patient knows to call the clinic with any problems, questions or concerns. The total time spent in the appointment was 20 minutes encounter with patients including review of chart and various tests results, discussions about plan of care and coordination of care plan   [name], MD 02/22/2021 9:12 AM  INTERVAL HISTORY: Please see below for problem oriented charting. she returns for treatment follow-up She feels well.  She denies chest pain or shortness of breath.  No recent bleeding  REVIEW OF SYSTEMS:   Constitutional: Denies fevers, chills or abnormal weight loss Eyes: Denies blurriness of vision Ears, nose, mouth, throat, and face: Denies mucositis or sore throat Respiratory: Denies cough, dyspnea or wheezes Cardiovascular: Denies  palpitation, chest discomfort or lower extremity swelling Gastrointestinal:  Denies nausea, heartburn or change in bowel habits Skin: Denies abnormal skin rashes Lymphatics: Denies new lymphadenopathy or easy bruising Neurological:Denies numbness, tingling or new weaknesses Behavioral/Psych: Mood is stable, no new changes  All other systems were reviewed with the patient and are negative.  I have reviewed the past medical history, past surgical history, social history and family history with the patient and they are unchanged from previous note.  ALLERGIES:  is allergic to pineapple and prednisone.  MEDICATIONS:  Current Outpatient Medications  Medication Sig Dispense Refill   albuterol (VENTOLIN HFA) 108 (90 Base) MCG/ACT inhaler Inhale 1-2 puffs into the lungs every 6 (six) hours as needed for wheezing or shortness of breath.     Ascorbic Acid (VITAMIN C PO) Take 1 tablet by mouth once a week.     Ferrous Sulfate (IRON PO) Take 1 tablet by mouth daily.     ibuprofen (ADVIL) 800 MG tablet Take 1 tablet (800 mg total) by mouth every 8 (eight) hours as needed for moderate pain. (Patient not taking: Reported on 02/18/2021) 30 tablet 1   lidocaine-prilocaine (EMLA) cream Apply to affected area once (Patient not taking: Reported on 02/18/2021) 30 g 3   metFORMIN (GLUCOPHAGE) 500 MG tablet Take 1 tablet (500 mg total) by mouth 2 (two) times daily with a meal. (Patient taking differently: Take 500 mg by mouth at bedtime.) 60 tablet 1   ondansetron (ZOFRAN) 8 MG tablet Take 1 tablet (8 mg total) by mouth every 8 (eight) hours as needed. (Patient not taking: Reported on 02/18/2021) 30 tablet 1   oxyCODONE (OXY IR/ROXICODONE) 5 MG immediate release tablet Take  1-2 tablets (5-10 mg total) by mouth every 4 (four) hours as needed for severe pain. For AFTER surgery pain, do not take and drive (Patient not taking: Reported on 02/11/2021) 15 tablet 0   prochlorperazine (COMPAZINE) 10 MG tablet Take 1 tablet (10  mg total) by mouth every 6 (six) hours as needed (Nausea or vomiting). (Patient not taking: Reported on 02/18/2021) 30 tablet 1   propranolol (INDERAL) 40 MG tablet Take 40 mg by mouth daily as needed (migraines).     senna-docusate (SENOKOT-S) 8.6-50 MG tablet Take 2 tablets by mouth at bedtime. Do not take if having loose stools (Patient not taking: Reported on 02/06/2021) 30 tablet 0   No current facility-administered medications for this visit.   Facility-Administered Medications Ordered in Other Visits  Medication Dose Route Frequency Provider Last Rate Last Admin   0.9 % NaCl with KCl 20 mEq/ L  infusion   Intravenous Once Alvy Bimler, Bernon Arviso, MD       CISplatin (PLATINOL) 79 mg in sodium chloride 0.9 % 250 mL chemo infusion  32 mg/m2 (Treatment Plan Recorded) Intravenous Once Alvy Bimler, Aislynn Cifelli, MD       dexamethasone (DECADRON) 10 mg in sodium chloride 0.9 % 50 mL IVPB  10 mg Intravenous Once Alvy Bimler, Zoey Bidwell, MD       fosaprepitant (EMEND) 150 mg in sodium chloride 0.9 % 145 mL IVPB  150 mg Intravenous Once Alvy Bimler, Rikki Smestad, MD       heparin lock flush 100 unit/mL  500 Units Intracatheter Once PRN Alvy Bimler, Grady Lucci, MD       magnesium sulfate IVPB 2 g 50 mL  2 g Intravenous Once Alvy Bimler, Kewanna Kasprzak, MD       palonosetron (ALOXI) injection 0.25 mg  0.25 mg Intravenous Once Audie Stayer, MD       sodium chloride flush (NS) 0.9 % injection 10 mL  10 mL Intracatheter PRN Alvy Bimler, Yared Susan, MD        SUMMARY OF ONCOLOGIC HISTORY: Oncology History Overview Note  At the GYN Tumor board discussion, overall consensus is cervical cancer 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 Large indeterminate right adnexal mass with solid hypervascular and cystic components. This appears separate from the base of the appendix on preceding CT and is likely arising from the right ovary based on relationship to the gonadal vessels on CT. Given the patient's young age, hypervascularity and  leukocytosis, findings could represent an atypical tubo-ovarian abscess. However, the appearance is concerning for ovarian malignancy despite the patient's young age. Recommend prompt gynecology consultation. Pelvic MRI without and with contrast may be helpful for further evaluation. 2. The left ovary appears normal. Mild nonspecific thickening of the endometrium, within physiologic limits.   01/02/2021 Pathology Results   SPECIMEN ADEQUACY: Satisfactory for evaluation; transformation zone component PRESENT. INTERPRETATION: - Adenocarcinoma, NOS   01/15/2021 Imaging   MRI pelvis  13.0 cm cystic and solid mass arising from the right ovary, highly suspicious for ovarian carcinoma.   Mild endometrial thickening and 4.5 cm soft tissue mass within the endocervical canal. Differential diagnosis includes prolapsing endometrial polyp or carcinoma, or primary cervical carcinoma.   No evidence of pelvic metastatic disease.   01/16/2021 Initial Diagnosis   Malignant neoplasm of cervix ([place])   01/22/2021 PET scan   1. Large cystic/necrotic right adnexal mass is markedly hypermetabolic and consistent with known neoplasm. 2. Extensive hypermetabolic tumor involving the endometrium and cervix. 3. Scattered borderline enlarged  and mildly hypermetabolic retroperitoneal and external iliac nodes. 4. No findings for metastatic disease involving the chest or bony structures.   01/24/2021 Surgery   Pre-operative Diagnosis: Complex adnexal mass, elevated CA-125, locally invasive cervical adenocarcinoma versus endometrial adenocarcinoma   Post-operative Diagnosis: same, high-grade carcinoma of the right ovary, suspected history of superinfection of the adnexal mass   Operation: Robotic-assisted laparoscopic bilateral salpingectomy, right oophorectomy, lysis of adhesions of approximately 45 minutes, right ureterolysis   Surgeon: Jeral Pinch MD    Operative Findings: On EUA, cervix enlarged and firm,  fleshy tumor replacing the endocervix.  No definitive parametrial involvement appreciated.  Even gentle exam because significant bleeding so vagina was packed with lap sponges for the remainder of surgery.  On intra-abdominal exam, normal upper abdominal survey including omentum, liver edge, diaphragm, and stomach.  Normal small bowel.  Some physiologic adhesions of the sigmoid epiploica to the left sidewall.  Normal-appearing left ovary measuring approximately 3-4 cm.  Normal-appearing bilateral fallopian tubes.  Uterus approximately 8-10 cm and overall normal in appearance.  Right ovary replaced by a smooth but vascular 10 cm mass with multiple loculations.  Ovary with dense adhesions to the right pelvic sidewall.  Upon very careful manipulation of the mass for its removal, there was some drainage of fluid noted that was somewhat purulent.  This was sent for culture and Gram stain.  Upon contained mass removal with fractionation of the mass, tumor versus necrotic material was noted.  Frozen section consistent with high-grade carcinoma, unable to differentiate on frozen section whether this is the same cancer being seen on her cervical biopsies.  Right ureterolysis performed given oozing services from the broad ligament and peritoneum where adhesions were lysed between the ovary and peritoneum, to assure ureter not in close proximity.  No obvious adenopathy.  No ascites.  Left ovary transposed out of the pelvis along the left gutter, clips placed along the sidewall as well as the superior and inferior aspect of the ovary.     01/24/2021 Pathology Results   FINAL MICROSCOPIC DIAGNOSIS:   A. OVARY AND FALLOPIAN TUBE, RIGHT, SALPINGO OOPHORECTOMY:  - Endometrioid carcinoma, moderately differentiated, involving right ovary  - No evidence of ovarian surface involvement by carcinoma  - Segment of fallopian tube, negative for carcinoma  - See oncology table  - See comment   B. FALLOPIAN TUBE, LEFT,  SALPINGECTOMY:  - Segment of fallopian tube, negative for carcinoma   A.  Immunostain for p16 shows patchy staining.  Immunostain for p53 shows a normal, wild-type expression pattern.  This immunoprofile is consistent with above interpretation.  Patient's previous cervical  biopsy was reviewed - it shows moderately differentiated carcinoma with a very similar histomorphology and very likely presents metastasis from the ovarian tumor to the cervix.    02/05/2021 Cancer Staging   Staging form: Cervix Uteri, AJCC Version 9 - Pathologic stage from 02/05/2021: FIGO Stage II (pT2, pN0, cM0) - Signed by [name], MD on 02/05/2021 Stage prefix: Initial diagnosis    02/14/2021 Procedure   Successful placement of a right internal jugular approach power injectable Port-A-Cath. The catheter is ready for immediate use   02/22/2021 -  Chemotherapy   Patient is on Treatment Plan : Cervical cancer Cisplatin q7d       PHYSICAL EXAMINATION: ECOG PERFORMANCE STATUS: 1 - Symptomatic but completely ambulatory GENERAL:alert, no distress and comfortable NEURO: alert & oriented x 3 with fluent speech, no focal motor/sensory deficits  LABORATORY DATA:  I have reviewed the data  as listed    Component Value Date/Time   NA 137 02/18/2021 1435   K 3.9 02/18/2021 1435   CL 105 02/18/2021 1435   CO2 26 02/18/2021 1435   GLUCOSE 132 (H) 02/18/2021 1435   BUN 13 02/18/2021 1435   CREATININE 0.56 02/18/2021 1435   CALCIUM 9.2 02/18/2021 1435   PROT 8.1 01/18/2021 0855   ALBUMIN 4.3 01/18/2021 0855   AST 12 (L) 01/18/2021 0855   ALT 11 01/18/2021 0855   ALKPHOS 92 01/18/2021 0855   BILITOT 0.3 01/18/2021 0855   GFRNONAA >60 02/18/2021 1435   GFRAA >60 05/18/2019 1957    No results found for: SPEP, UPEP  Lab Results  Component Value Date   WBC 8.2 02/18/2021   NEUTROABS 5.1 02/18/2021   HGB 7.9 (L) 02/18/2021   HCT 29.5 (L) 02/18/2021   MCV 62.6 (L) 02/18/2021   PLT 375 02/18/2021      Chemistry       Component Value Date/Time   NA 137 02/18/2021 1435   K 3.9 02/18/2021 1435   CL 105 02/18/2021 1435   CO2 26 02/18/2021 1435   BUN 13 02/18/2021 1435   CREATININE 0.56 02/18/2021 1435      Component Value Date/Time   CALCIUM 9.2 02/18/2021 1435   ALKPHOS 92 01/18/2021 0855   AST 12 (L) 01/18/2021 0855   ALT 11 01/18/2021 0855   BILITOT 0.3 01/18/2021 0855

## 2021-02-22 NOTE — Assessment & Plan Note (Signed)
She will start first dose treatment today Due to severe iron deficiency anemia, we will also provide IV iron support

## 2021-02-22 NOTE — Patient Instructions (Signed)
[facility] MEDICAL ONCOLOGY  Discharge Instructions: °Thank you for choosing [facility] to provide your oncology and hematology care.  ° °If you have a lab appointment with the Cancer Center, please go directly to the Cancer Center and check in at the registration area. °  °Wear comfortable clothing and clothing appropriate for easy access to any Portacath or PICC line.  ° °We strive to give you quality time with your provider. You may need to reschedule your appointment if you arrive late (15 or more minutes).  Arriving late affects you and other patients whose appointments are after yours.  Also, if you miss three or more appointments without notifying the office, you may be dismissed from the clinic at the provider’s discretion.    °  °For prescription refill requests, have your pharmacy contact our office and allow 72 hours for refills to be completed.   ° °Today you received the following chemotherapy and/or immunotherapy agents Cisplatin    °  °To help prevent nausea and vomiting after your treatment, we encourage you to take your nausea medication as directed. ° °BELOW ARE SYMPTOMS THAT SHOULD BE REPORTED IMMEDIATELY: °*FEVER GREATER THAN 100.4 F (38 °C) OR HIGHER °*CHILLS OR SWEATING °*NAUSEA AND VOMITING THAT IS NOT CONTROLLED WITH YOUR NAUSEA MEDICATION °*UNUSUAL SHORTNESS OF BREATH °*UNUSUAL BRUISING OR BLEEDING °*URINARY PROBLEMS (pain or burning when urinating, or frequent urination) °*BOWEL PROBLEMS (unusual diarrhea, constipation, pain near the anus) °TENDERNESS IN MOUTH AND THROAT WITH OR WITHOUT PRESENCE OF ULCERS (sore throat, sores in mouth, or a toothache) °UNUSUAL RASH, SWELLING OR PAIN  °UNUSUAL VAGINAL DISCHARGE OR ITCHING  ° °Items with * indicate a potential emergency and should be followed up as soon as possible or go to the Emergency Department if any problems should occur. ° °Please show the CHEMOTHERAPY ALERT CARD or IMMUNOTHERAPY ALERT CARD at check-in to  the Emergency Department and triage nurse. ° °Should you have questions after your visit or need to cancel or reschedule your appointment, please contact [facility]  Dept: [phone]  and follow the prompts.  Office hours are 8:00 a.m. to 4:30 p.m. Monday - Friday. Please note that voicemails left after 4:00 p.m. may not be returned until the following business day.  We are closed weekends and major holidays. You have access to a nurse at all times for urgent questions. Please call the main number to the clinic Dept: [phone] and follow the prompts. ° ° °For any non-urgent questions, you may also contact your provider using MyChart. We now offer e-Visits for anyone 18 and older to request care online for non-urgent symptoms. For details visit [email or web address]. °  °Also download the MyChart app! Go to the app store, search "MyChart", open the app, select [facility], and log in with your MyChart username and password. ° °Due to Covid, a mask is required upon entering the hospital/clinic. If you do not have a mask, one will be given to you upon arrival. For doctor visits, patients may have 1 support person aged 18 or older with them. For treatment visits, patients cannot have anyone with them due to current Covid guidelines and our immunocompromised population.  ° °Cisplatin injection °What is this medication? °CISPLATIN (SIS pla tin) is a chemotherapy drug. It targets fast dividing cells, like cancer cells, and causes these cells to die. This medicine is used to treat many types of cancer like bladder, ovarian, and testicular cancers. °This medicine may be used   for other purposes; ask your health care provider or pharmacist if you have questions. °COMMON BRAND NAME(S): Platinol, Platinol -AQ °What should I tell my care team before I take this medication? °They need to know if you have any of these conditions: °eye disease, vision problems °hearing problems °kidney  disease °low blood counts, like white cells, platelets, or red blood cells °tingling of the fingers or toes, or other nerve disorder °an unusual or allergic reaction to cisplatin, carboplatin, oxaliplatin, other medicines, foods, dyes, or preservatives °pregnant or trying to get pregnant °breast-feeding °How should I use this medication? °This drug is given as an infusion into a vein. It is administered in a hospital or clinic by a specially trained health care professional. °Talk to your pediatrician regarding the use of this medicine in children. Special care may be needed. °Overdosage: If you think you have taken too much of this medicine contact a poison control center or emergency room at once. °NOTE: This medicine is only for you. Do not share this medicine with others. °What if I miss a dose? °It is important not to miss a dose. Call your doctor or health care professional if you are unable to keep an appointment. °What may interact with this medication? °This medicine may interact with the following medications: °foscarnet °certain antibiotics like amikacin, gentamicin, neomycin, polymyxin B, streptomycin, tobramycin, vancomycin °This list may not describe all possible interactions. Give your health care provider a list of all the medicines, herbs, non-prescription drugs, or dietary supplements you use. Also tell them if you smoke, drink alcohol, or use illegal drugs. Some items may interact with your medicine. °What should I watch for while using this medication? °Your condition will be monitored carefully while you are receiving this medicine. You will need important blood work done while you are taking this medicine. °This drug may make you feel generally unwell. This is not uncommon, as chemotherapy can affect healthy cells as well as cancer cells. Report any side effects. Continue your course of treatment even though you feel ill unless your doctor tells you to stop. °This medicine may increase your  risk of getting an infection. Call your healthcare professional for advice if you get a fever, chills, or sore throat, or other symptoms of a cold or flu. Do not treat yourself. Try to avoid being around people who are sick. °Avoid taking medicines that contain aspirin, acetaminophen, ibuprofen, naproxen, or ketoprofen unless instructed by your healthcare professional. These medicines may hide a fever. °This medicine may increase your risk to bruise or bleed. Call your doctor or health care professional if you notice any unusual bleeding. °Be careful brushing and flossing your teeth or using a toothpick because you may get an infection or bleed more easily. If you have any dental work done, tell your dentist you are receiving this medicine. °Do not become pregnant while taking this medicine or for 14 months after stopping it. Women should inform their healthcare professional if they wish to become pregnant or think they might be pregnant. Men should not father a child while taking this medicine and for 11 months after stopping it. There is potential for serious side effects to an unborn child. Talk to your healthcare professional for more information. °Do not breast-feed an infant while taking this medicine. °This medicine has caused ovarian failure in some women. This medicine may make it more difficult to get pregnant. Talk to your healthcare professional if you are concerned about your fertility. °This medicine has caused   decreased sperm counts in some men. This may make it more difficult to father a child. Talk to your healthcare professional if you are concerned about your fertility. °Drink fluids as directed while you are taking this medicine. This will help protect your kidneys. °Call your doctor or health care professional if you get diarrhea. Do not treat yourself. °What side effects may I notice from receiving this medication? °Side effects that you should report to your doctor or health care professional  as soon as possible: °allergic reactions like skin rash, itching or hives, swelling of the face, lips, or tongue °blurred vision °changes in vision °decreased hearing or ringing of the ears °nausea, vomiting °pain, redness, or irritation at site where injected °pain, tingling, numbness in the hands or feet °signs and symptoms of bleeding such as bloody or black, tarry stools; red or dark brown urine; spitting up blood or brown material that looks like coffee grounds; red spots on the skin; unusual bruising or bleeding from the eyes, gums, or nose °signs and symptoms of infection like fever; chills; cough; sore throat; pain or trouble passing urine °signs and symptoms of kidney injury like trouble passing urine or change in the amount of urine °signs and symptoms of low red blood cells or anemia such as unusually weak or tired; feeling faint or lightheaded; falls; breathing problems °Side effects that usually do not require medical attention (report to your doctor or health care professional if they continue or are bothersome): °loss of appetite °mouth sores °muscle cramps °This list may not describe all possible side effects. Call your doctor for medical advice about side effects. You may report side effects to FDA at 1-800-FDA-1088. °Where should I keep my medication? °This drug is given in a hospital or clinic and will not be stored at home. °NOTE: This sheet is a summary. It may not cover all possible information. If you have questions about this medicine, talk to your doctor, pharmacist, or health care provider. °© 2022 [employer] (2020-09-25 00:00:00) ° ° °

## 2021-02-22 NOTE — Assessment & Plan Note (Signed)
The most likely cause of her anemia is due to chronic blood loss/malabsorption syndrome. We discussed some of the risks, benefits, and alternatives of intravenous iron infusions. The patient is symptomatic from anemia and the iron level is critically low. She tolerated oral iron supplement poorly and desires to achieved higher levels of iron faster for adequate hematopoesis. Some of the side-effects to be expected including risks of infusion reactions, phlebitis, headaches, nausea and fatigue.  The patient is willing to proceed. Patient education material was dispensed.  Goal is to keep ferritin level greater than 50 and resolution of anemia

## 2021-02-25 ENCOUNTER — Other Ambulatory Visit: Payer: Self-pay

## 2021-02-25 ENCOUNTER — Ambulatory Visit
Admission: RE | Admit: 2021-02-25 | Discharge: 2021-02-25 | Disposition: A | Discharge status: Home / Self Care | Payer: Medicaid Other | Source: Ambulatory Visit | Attending: Radiation Oncology | Admitting: Radiation Oncology

## 2021-02-25 DIAGNOSIS — C539 Malignant neoplasm of cervix uteri, unspecified: Secondary | ICD-10-CM | POA: Diagnosis not present

## 2021-02-26 ENCOUNTER — Telehealth: Payer: Self-pay | Admitting: *Deleted

## 2021-02-26 ENCOUNTER — Ambulatory Visit
Admission: RE | Admit: 2021-02-26 | Discharge: 2021-02-26 | Disposition: A | Discharge status: Home / Self Care | Payer: Medicaid Other | Source: Ambulatory Visit | Attending: Radiation Oncology | Admitting: Radiation Oncology

## 2021-02-26 DIAGNOSIS — C539 Malignant neoplasm of cervix uteri, unspecified: Secondary | ICD-10-CM | POA: Diagnosis not present

## 2021-02-26 NOTE — Telephone Encounter (Signed)
Called patient for chemotherapy follow up. States she tolerated well. No questions or concerns. Eating and drinking without any problems. Felt nauseated once, but did not throw up. Reviewed use of zofran and compazine as needed

## 2021-02-26 NOTE — Progress Notes (Signed)
Pt here for patient teaching.    Pt given Radiation and You booklet and skin care instructions.    Reviewed areas of pertinence such as diarrhea, fatigue, hair loss, nausea and vomiting, sexual and fertility changes, skin changes, and urinary and bladder changes .   Pt able to give teach back of to pat skin, use unscented/gentle soap, have Imodium on hand, and drink plenty of water,avoid applying anything to skin within 4 hours of treatment.   Pt verbalizes understanding of information given and will contact nursing with any questions or concerns.

## 2021-02-27 ENCOUNTER — Other Ambulatory Visit: Payer: Self-pay

## 2021-02-27 ENCOUNTER — Ambulatory Visit
Admission: RE | Admit: 2021-02-27 | Discharge: 2021-02-27 | Disposition: A | Discharge status: Home / Self Care | Payer: Medicaid Other | Source: Ambulatory Visit | Attending: Radiation Oncology | Admitting: Radiation Oncology

## 2021-02-27 ENCOUNTER — Inpatient Hospital Stay: Payer: Medicaid Other

## 2021-02-27 DIAGNOSIS — C539 Malignant neoplasm of cervix uteri, unspecified: Secondary | ICD-10-CM | POA: Diagnosis not present

## 2021-02-27 LAB — CBC WITH DIFFERENTIAL (CANCER CENTER ONLY)
Abs Immature Granulocytes: 0.07 10*3/uL (ref 0.00–0.07)
Basophils Absolute: 0.1 10*3/uL (ref 0.0–0.1)
Basophils Relative: 0 %
Eosinophils Absolute: 0.2 10*3/uL (ref 0.0–0.5)
Eosinophils Relative: 2 %
HCT: 34.6 % — ABNORMAL LOW (ref 36.0–46.0)
Hemoglobin: 9.4 g/dL — ABNORMAL LOW (ref 12.0–15.0)
Immature Granulocytes: 1 %
Lymphocytes Relative: 19 %
Lymphs Abs: 2.6 10*3/uL (ref 0.7–4.0)
MCH: 18 pg — ABNORMAL LOW (ref 26.0–34.0)
MCHC: 27.2 g/dL — ABNORMAL LOW (ref 30.0–36.0)
MCV: 66.3 fL — ABNORMAL LOW (ref 80.0–100.0)
Monocytes Absolute: 0.7 10*3/uL (ref 0.1–1.0)
Monocytes Relative: 5 %
Neutro Abs: 9.8 10*3/uL — ABNORMAL HIGH (ref 1.7–7.7)
Neutrophils Relative %: 73 %
Platelet Count: 274 10*3/uL (ref 150–400)
RBC: 5.22 MIL/uL — ABNORMAL HIGH (ref 3.87–5.11)
RDW: 24.1 % — ABNORMAL HIGH (ref 11.5–15.5)
WBC Count: 13.4 10*3/uL — ABNORMAL HIGH (ref 4.0–10.5)
nRBC: 0.1 % (ref 0.0–0.2)

## 2021-02-27 LAB — BASIC METABOLIC PANEL - CANCER CENTER ONLY
Anion gap: 4 — ABNORMAL LOW (ref 5–15)
BUN: 13 mg/dL (ref 6–20)
CO2: 30 mmol/L (ref 22–32)
Calcium: 9.6 mg/dL (ref 8.9–10.3)
Chloride: 101 mmol/L (ref 98–111)
Creatinine: 0.61 mg/dL (ref 0.44–1.00)
GFR, Estimated: >60 mL/min (ref >60)
Glucose, Bld: 120 mg/dL — ABNORMAL HIGH (ref 70–99)
Potassium: 4.5 mmol/L (ref 3.5–5.1)
Sodium: 135 mmol/L (ref 135–145)

## 2021-02-27 LAB — MAGNESIUM: Magnesium: 2.1 mg/dL (ref 1.7–2.4)

## 2021-02-28 ENCOUNTER — Inpatient Hospital Stay (HOSPITAL_BASED_OUTPATIENT_CLINIC_OR_DEPARTMENT_OTHER): Payer: Medicaid Other | Admitting: Hematology and Oncology

## 2021-02-28 ENCOUNTER — Ambulatory Visit
Admission: RE | Admit: 2021-02-28 | Discharge: 2021-02-28 | Disposition: A | Discharge status: Home / Self Care | Payer: Medicaid Other | Source: Ambulatory Visit | Attending: Radiation Oncology | Admitting: Radiation Oncology

## 2021-02-28 ENCOUNTER — Encounter: Payer: Self-pay | Admitting: Hematology and Oncology

## 2021-02-28 ENCOUNTER — Other Ambulatory Visit: Payer: Self-pay | Admitting: Hematology and Oncology

## 2021-02-28 DIAGNOSIS — D500 Iron deficiency anemia secondary to blood loss (chronic): Secondary | ICD-10-CM | POA: Diagnosis not present

## 2021-02-28 DIAGNOSIS — C539 Malignant neoplasm of cervix uteri, unspecified: Secondary | ICD-10-CM | POA: Diagnosis not present

## 2021-02-28 DIAGNOSIS — D72829 Elevated white blood cell count, unspecified: Secondary | ICD-10-CM | POA: Diagnosis not present

## 2021-02-28 MED FILL — Fosaprepitant Dimeglumine For IV Infusion 150 MG (Base Eq): INTRAVENOUS | Qty: 5 | Status: AC

## 2021-02-28 MED FILL — Dexamethasone Sodium Phosphate Inj 100 MG/10ML: INTRAMUSCULAR | Qty: 1 | Status: AC

## 2021-02-28 NOTE — Assessment & Plan Note (Signed)
Overall, she tolerated treatment fairly well We will proceed with treatment without delay

## 2021-02-28 NOTE — Assessment & Plan Note (Signed)
She had received intravenous iron with significant improvement of her blood count She will get another dose of IV iron this week

## 2021-02-28 NOTE — Progress Notes (Addendum)
[facility] OFFICE PROGRESS NOTE  Patient Care Team: Health, [facility] Dept Personal as PCP - General [name], Craige Cotta, RN as Oncology Nurse Navigator (Oncology)  ASSESSMENT & PLAN:  Malignant neoplasm of cervix ([place]) Overall, she tolerated treatment fairly well We will proceed with treatment without delay  Iron deficiency anemia due to chronic blood loss She had received intravenous iron with significant improvement of her blood count She will get another dose of IV iron this week  Leukocytosis She has mild leukocytosis and had 1 episode of chills that has resolved She have no localizing signs to suggest active infection Observe for now  No orders of the defined types were placed in this encounter.   All questions were answered. The patient knows to call the clinic with any problems, questions or concerns. The total time spent in the appointment was 20 minutes encounter with patients including review of chart and various tests results, discussions about plan of care and coordination of care plan   [name], MD 02/28/2021 9:59 AM  INTERVAL HISTORY: Please see below for problem oriented charting. she returns for treatment follow-up seen prior to cycle 2 of cisplatin She had 1 episode of chills 2 days ago that has resolved spontaneously Denies recent vaginal discharge or bleeding No peripheral neuropathy, nausea or changes in bowel habits  REVIEW OF SYSTEMS:   Eyes: Denies blurriness of vision Ears, nose, mouth, throat, and face: Denies mucositis or sore throat Respiratory: Denies cough, dyspnea or wheezes Cardiovascular: Denies palpitation, chest discomfort or lower extremity swelling Gastrointestinal:  Denies nausea, heartburn or change in bowel habits Skin: Denies abnormal skin rashes Lymphatics: Denies new lymphadenopathy or easy bruising Neurological:Denies numbness, tingling or new weaknesses Behavioral/Psych: Mood is stable, no new changes   All other systems were reviewed with the patient and are negative.  I have reviewed the past medical history, past surgical history, social history and family history with the patient and they are unchanged from previous note.  ALLERGIES:  is allergic to pineapple and prednisone.  MEDICATIONS:  Current Outpatient Medications  Medication Sig Dispense Refill   albuterol (VENTOLIN HFA) 108 (90 Base) MCG/ACT inhaler Inhale 1-2 puffs into the lungs every 6 (six) hours as needed for wheezing or shortness of breath.     Ascorbic Acid (VITAMIN C PO) Take 1 tablet by mouth once a week.     Ferrous Sulfate (IRON PO) Take 1 tablet by mouth daily.     lidocaine-prilocaine (EMLA) cream Apply to affected area once (Patient not taking: Reported on 02/18/2021) 30 g 3   metFORMIN (GLUCOPHAGE) 500 MG tablet Take 1 tablet (500 mg total) by mouth 2 (two) times daily with a meal. (Patient taking differently: Take 500 mg by mouth at bedtime.) 60 tablet 1   ondansetron (ZOFRAN) 8 MG tablet Take 1 tablet (8 mg total) by mouth every 8 (eight) hours as needed. (Patient not taking: Reported on 02/18/2021) 30 tablet 1   prochlorperazine (COMPAZINE) 10 MG tablet Take 1 tablet (10 mg total) by mouth every 6 (six) hours as needed (Nausea or vomiting). (Patient not taking: Reported on 02/18/2021) 30 tablet 1   propranolol (INDERAL) 40 MG tablet Take 40 mg by mouth daily as needed (migraines).     senna-docusate (SENOKOT-S) 8.6-50 MG tablet Take 2 tablets by mouth at bedtime. Do not take if having loose stools (Patient not taking: Reported on 02/06/2021) 30 tablet 0   No current facility-administered medications for this visit.  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 Large indeterminate right adnexal mass with  solid hypervascular and cystic components. This appears separate from the base of the appendix on preceding CT and is likely arising from the right ovary based on relationship to the gonadal vessels on CT. Given the patient's young age, hypervascularity and leukocytosis, findings could represent an atypical tubo-ovarian abscess. However, the appearance is concerning for ovarian malignancy despite the patient's young age. Recommend prompt gynecology consultation. Pelvic MRI without and with contrast may be helpful for further evaluation. 2. The left ovary appears normal. Mild nonspecific thickening of the endometrium, within physiologic limits.   01/02/2021 Pathology Results   SPECIMEN ADEQUACY: Satisfactory for evaluation; transformation zone component PRESENT. INTERPRETATION: - Adenocarcinoma, NOS   01/15/2021 Imaging   MRI pelvis  13.0 cm cystic and solid mass arising from the right ovary, highly suspicious for ovarian carcinoma.   Mild endometrial thickening and 4.5 cm soft tissue mass within the endocervical canal. Differential diagnosis includes prolapsing endometrial polyp or carcinoma, or primary cervical carcinoma.   No evidence of pelvic metastatic disease.   01/16/2021 Initial Diagnosis   Malignant neoplasm of cervix ([place])   01/22/2021 PET scan   1. Large cystic/necrotic right adnexal mass is markedly hypermetabolic and consistent with known neoplasm. 2. Extensive hypermetabolic tumor involving the endometrium and cervix. 3. Scattered borderline enlarged and mildly hypermetabolic retroperitoneal and external iliac nodes. 4. No findings for metastatic disease involving the chest or bony structures.   01/24/2021 Surgery   Pre-operative Diagnosis: Complex adnexal mass, elevated CA-125, locally invasive cervical adenocarcinoma versus endometrial adenocarcinoma   Post-operative Diagnosis: same, high-grade carcinoma of the right ovary, suspected history of superinfection of the adnexal  mass   Operation: Robotic-assisted laparoscopic bilateral salpingectomy, right oophorectomy, lysis of adhesions of approximately 45 minutes, right ureterolysis   Surgeon: Jeral Pinch MD    Operative Findings: On EUA, cervix enlarged and firm, fleshy tumor replacing the endocervix.  No definitive parametrial involvement appreciated.  Even gentle exam because significant bleeding so vagina was packed with lap sponges for the remainder of surgery.  On intra-abdominal exam, normal upper abdominal survey including omentum, liver edge, diaphragm, and stomach.  Normal small bowel.  Some physiologic adhesions of the sigmoid epiploica to the left sidewall.  Normal-appearing left ovary measuring approximately 3-4 cm.  Normal-appearing bilateral fallopian tubes.  Uterus approximately 8-10 cm and overall normal in appearance.  Right ovary replaced by a smooth but vascular 10 cm mass with multiple loculations.  Ovary with dense adhesions to the right pelvic sidewall.  Upon very careful manipulation of the mass for its removal, there was some drainage of fluid noted that was somewhat purulent.  This was sent for culture and Gram stain.  Upon contained mass removal with fractionation of the mass, tumor versus necrotic material was noted.  Frozen section consistent with high-grade carcinoma, unable to differentiate on frozen section whether this is the same cancer being seen on her cervical biopsies.  Right ureterolysis performed given oozing services from the broad ligament and peritoneum where adhesions were lysed between the ovary and peritoneum, to assure ureter not in close proximity.  No obvious adenopathy.  No ascites.  Left ovary transposed out of the pelvis along the left gutter, clips placed along the sidewall as  well as the superior and inferior aspect of the ovary.     01/24/2021 Pathology Results   FINAL MICROSCOPIC DIAGNOSIS:   A. OVARY AND FALLOPIAN TUBE, RIGHT, SALPINGO OOPHORECTOMY:  - Endometrioid  carcinoma, moderately differentiated, involving right ovary  - No evidence of ovarian surface involvement by carcinoma  - Segment of fallopian tube, negative for carcinoma  - See oncology table  - See comment   B. FALLOPIAN TUBE, LEFT, SALPINGECTOMY:  - Segment of fallopian tube, negative for carcinoma   A.  Immunostain for p16 shows patchy staining.  Immunostain for p53 shows a normal, wild-type expression pattern.  This immunoprofile is consistent with above interpretation.  Patient's previous cervical  biopsy was reviewed - it shows moderately differentiated carcinoma with a very similar histomorphology and very likely presents metastasis from the ovarian tumor to the cervix.    02/05/2021 Cancer Staging   Staging form: Cervix Uteri, AJCC Version 9 - Pathologic stage from 02/05/2021: FIGO Stage II (pT2, pN0, cM0) - Signed by [name], MD on 02/05/2021 Stage prefix: Initial diagnosis    02/14/2021 Procedure   Successful placement of a right internal jugular approach power injectable Port-A-Cath. The catheter is ready for immediate use   02/22/2021 -  Chemotherapy   Patient is on Treatment Plan : Cervical cancer Cisplatin q7d       PHYSICAL EXAMINATION: ECOG PERFORMANCE STATUS: 1 - Symptomatic but completely ambulatory  Vitals:   02/28/21 0906  BP: 116/74  Pulse: 79  Resp: 18  Temp: 98.3 F (36.8 C)  SpO2: 100%   Filed Weights   02/28/21 0906  Weight: 279 lb 3.2 oz (126.6 kg)    GENERAL:alert, no distress and comfortable SKIN: skin color, texture, turgor are normal, no rashes or significant lesions EYES: normal, Conjunctiva are pink and non-injected, sclera clear OROPHARYNX:no exudate, no erythema and lips, buccal mucosa, and tongue normal  NECK: supple, thyroid normal size, non-tender, without nodularity LYMPH:  no palpable lymphadenopathy in the cervical, axillary or inguinal LUNGS: clear to auscultation and percussion with normal breathing effort HEART: regular  rate & rhythm and no murmurs and no lower extremity edema ABDOMEN:abdomen soft, non-tender and normal bowel sounds Musculoskeletal:no cyanosis of digits and no clubbing  NEURO: alert & oriented x 3 with fluent speech, no focal motor/sensory deficits  LABORATORY DATA:  I have reviewed the data as listed    Component Value Date/Time   NA 135 02/27/2021 0949   K 4.5 02/27/2021 0949   CL 101 02/27/2021 0949   CO2 30 02/27/2021 0949   GLUCOSE 120 (H) 02/27/2021 0949   BUN 13 02/27/2021 0949   CREATININE 0.61 02/27/2021 0949   CALCIUM 9.6 02/27/2021 0949   PROT 8.1 01/18/2021 0855   ALBUMIN 4.3 01/18/2021 0855   AST 12 (L) 01/18/2021 0855   ALT 11 01/18/2021 0855   ALKPHOS 92 01/18/2021 0855   BILITOT 0.3 01/18/2021 0855   GFRNONAA >60 02/27/2021 0949   GFRAA >60 05/18/2019 1957    No results found for: SPEP, UPEP  Lab Results  Component Value Date   WBC 13.4 (H) 02/27/2021   NEUTROABS 9.8 (H) 02/27/2021   HGB 9.4 (L) 02/27/2021   HCT 34.6 (L) 02/27/2021   MCV 66.3 (L) 02/27/2021   PLT 274 02/27/2021      Chemistry      Component Value Date/Time   NA 135 02/27/2021 0949   K 4.5 02/27/2021 0949   CL 101 02/27/2021 0949   CO2 30 02/27/2021 0949  BUN 13 02/27/2021 0949   CREATININE 0.61 02/27/2021 0949      Component Value Date/Time   CALCIUM 9.6 02/27/2021 0949   ALKPHOS 92 01/18/2021 0855   AST 12 (L) 01/18/2021 0855   ALT 11 01/18/2021 0855   BILITOT 0.3 01/18/2021 0855

## 2021-02-28 NOTE — Assessment & Plan Note (Signed)
She has mild leukocytosis and had 1 episode of chills that has resolved She have no localizing signs to suggest active infection Observe for now

## 2021-03-01 ENCOUNTER — Other Ambulatory Visit: Payer: Self-pay

## 2021-03-01 ENCOUNTER — Other Ambulatory Visit: Payer: Self-pay | Admitting: Hematology and Oncology

## 2021-03-01 ENCOUNTER — Ambulatory Visit
Admission: RE | Admit: 2021-03-01 | Discharge: 2021-03-01 | Disposition: A | Discharge status: Home / Self Care | Payer: Medicaid Other | Source: Ambulatory Visit | Attending: Radiation Oncology | Admitting: Radiation Oncology

## 2021-03-01 ENCOUNTER — Inpatient Hospital Stay (HOSPITAL_BASED_OUTPATIENT_CLINIC_OR_DEPARTMENT_OTHER): Payer: Medicaid Other

## 2021-03-01 VITALS — BP 143/81 | HR 95 | Temp 98.7°F | Resp 17

## 2021-03-01 DIAGNOSIS — D509 Iron deficiency anemia, unspecified: Secondary | ICD-10-CM

## 2021-03-01 DIAGNOSIS — C539 Malignant neoplasm of cervix uteri, unspecified: Secondary | ICD-10-CM | POA: Diagnosis not present

## 2021-03-01 MED ORDER — SODIUM CHLORIDE 0.9 % IV SOLN
10.0000 mg | Freq: Once | INTRAVENOUS | Status: AC
  Administered 2021-03-01: 10 mg via INTRAVENOUS
  Filled 2021-03-01: qty 10

## 2021-03-01 MED ORDER — SODIUM CHLORIDE 0.9 % IV SOLN: Freq: Once | INTRAVENOUS | Status: AC

## 2021-03-01 MED ORDER — SODIUM CHLORIDE 0.9 % IV SOLN
150.0000 mg | Freq: Once | INTRAVENOUS | Status: AC
  Administered 2021-03-01: 150 mg via INTRAVENOUS
  Filled 2021-03-01: qty 150

## 2021-03-01 MED ORDER — POTASSIUM CHLORIDE IN NACL 20-0.9 MEQ/L-% IV SOLN
Freq: Once | INTRAVENOUS | Status: AC
  Filled 2021-03-01: qty 1000

## 2021-03-01 MED ORDER — SODIUM CHLORIDE 0.9 % IV SOLN: Freq: Once | INTRAVENOUS | Status: DC

## 2021-03-01 MED ORDER — SODIUM CHLORIDE 0.9% FLUSH
10.0000 mL | INTRAVENOUS | Status: DC | PRN
  Administered 2021-03-01: 10 mL

## 2021-03-01 MED ORDER — SODIUM CHLORIDE 0.9 % IV SOLN
32.0000 mg/m2 | Freq: Once | INTRAVENOUS | Status: AC
  Administered 2021-03-01: 78 mg via INTRAVENOUS
  Filled 2021-03-01: qty 78

## 2021-03-01 MED ORDER — PALONOSETRON HCL INJECTION 0.25 MG/5ML
0.2500 mg | Freq: Once | INTRAVENOUS | Status: AC
  Administered 2021-03-01: 0.25 mg via INTRAVENOUS
  Filled 2021-03-01: qty 5

## 2021-03-01 MED ORDER — MAGNESIUM SULFATE 2 GM/50ML IV SOLN
2.0000 g | Freq: Once | INTRAVENOUS | Status: AC
  Administered 2021-03-01: 2 g via INTRAVENOUS
  Filled 2021-03-01: qty 50

## 2021-03-01 MED ORDER — HEPARIN SOD (PORK) LOCK FLUSH 100 UNIT/ML IV SOLN
500.0000 [IU] | Freq: Once | INTRAVENOUS | Status: AC | PRN
  Administered 2021-03-01: 500 [IU]

## 2021-03-01 MED ORDER — SODIUM CHLORIDE 0.9 % IV SOLN
510.0000 mg | Freq: Once | INTRAVENOUS | Status: AC
  Administered 2021-03-01: 510 mg via INTRAVENOUS
  Filled 2021-03-01: qty 510

## 2021-03-01 NOTE — Patient Instructions (Signed)
[facility]  Discharge Instructions: Thank you for choosing [facility] to provide your oncology and hematology care.   If you have a lab appointment with the [facility], please go directly to the [facility] and check in at the registration area.   Wear comfortable clothing and clothing appropriate for easy access to any Portacath or PICC line.   We strive to give you quality time with your provider. You may need to reschedule your appointment if you arrive late (15 or more minutes).  Arriving late affects you and other patients whose appointments are after yours.  Also, if you miss three or more appointments without notifying the office, you may be dismissed from the clinic at the providers discretion.      For prescription refill requests, have your pharmacy contact our office and allow 72 hours for refills to be completed.    Today you received the following chemotherapy and/or immunotherapy agents: Paclitaxel      To help prevent nausea and vomiting after your treatment, we encourage you to take your nausea medication as directed.  BELOW ARE SYMPTOMS THAT SHOULD BE REPORTED IMMEDIATELY: *FEVER GREATER THAN 100.4 F (38 C) OR HIGHER *CHILLS OR SWEATING *NAUSEA AND VOMITING THAT IS NOT CONTROLLED WITH YOUR NAUSEA MEDICATION *UNUSUAL SHORTNESS OF BREATH *UNUSUAL BRUISING OR BLEEDING *URINARY PROBLEMS (pain or burning when urinating, or frequent urination) *BOWEL PROBLEMS (unusual diarrhea, constipation, pain near the anus) TENDERNESS IN MOUTH AND THROAT WITH OR WITHOUT PRESENCE OF ULCERS (sore throat, sores in mouth, or a toothache) UNUSUAL RASH, SWELLING OR PAIN  UNUSUAL VAGINAL DISCHARGE OR ITCHING   Items with * indicate a potential emergency and should be followed up as soon as possible or go to the Emergency Department if any problems should occur.  Please show the CHEMOTHERAPY ALERT CARD or IMMUNOTHERAPY ALERT CARD at check-in to  the Emergency Department and triage nurse.  Should you have questions after your visit or need to cancel or reschedule your appointment, please contact [facility]  Dept: [phone]  and follow the prompts.  Office hours are 8:00 a.m. to 4:30 p.m. Monday - Friday. Please note that voicemails left after 4:00 p.m. may not be returned until the following business day.  We are closed weekends and major holidays. You have access to a nurse at all times for urgent questions. Please call the main number to the clinic Dept: [phone] and follow the prompts.   For any non-urgent questions, you may also contact your provider using MyChart. We now offer e-Visits for anyone 5 and older to request care online for non-urgent symptoms. For details visit [email or web address].   Also download the MyChart app! Go to the app store, search "MyChart", open the app, select [facility], and log in with your MyChart username and password.  Due to Covid, a mask is required upon entering the hospital/clinic. If you do not have a mask, one will be given to you upon arrival. For doctor visits, patients may have 1 support person aged 74 or older with them. For treatment visits, patients cannot have anyone with them due to current Covid guidelines and our immunocompromised population.

## 2021-03-01 NOTE — Progress Notes (Signed)
Ok per Dr Alvy Bimler to run post hydration with cisplatin today.

## 2021-03-04 ENCOUNTER — Other Ambulatory Visit: Payer: Self-pay

## 2021-03-04 ENCOUNTER — Ambulatory Visit
Admission: RE | Admit: 2021-03-04 | Discharge: 2021-03-04 | Disposition: A | Discharge status: Home / Self Care | Payer: Medicaid Other | Source: Ambulatory Visit | Attending: Radiation Oncology | Admitting: Radiation Oncology

## 2021-03-04 DIAGNOSIS — C539 Malignant neoplasm of cervix uteri, unspecified: Secondary | ICD-10-CM | POA: Diagnosis not present

## 2021-03-05 ENCOUNTER — Encounter: Payer: Self-pay | Admitting: Gynecologic Oncology

## 2021-03-05 ENCOUNTER — Inpatient Hospital Stay (HOSPITAL_BASED_OUTPATIENT_CLINIC_OR_DEPARTMENT_OTHER): Payer: Medicaid Other | Admitting: Gynecologic Oncology

## 2021-03-05 ENCOUNTER — Telehealth: Payer: Self-pay | Admitting: Oncology

## 2021-03-05 ENCOUNTER — Other Ambulatory Visit: Payer: Self-pay

## 2021-03-05 ENCOUNTER — Ambulatory Visit
Admission: RE | Admit: 2021-03-05 | Discharge: 2021-03-05 | Disposition: A | Discharge status: Home / Self Care | Payer: Medicaid Other | Source: Ambulatory Visit | Attending: Radiation Oncology | Admitting: Radiation Oncology

## 2021-03-05 DIAGNOSIS — Z9079 Acquired absence of other genital organ(s): Secondary | ICD-10-CM

## 2021-03-05 DIAGNOSIS — Z7189 Other specified counseling: Secondary | ICD-10-CM

## 2021-03-05 DIAGNOSIS — C539 Malignant neoplasm of cervix uteri, unspecified: Secondary | ICD-10-CM

## 2021-03-05 DIAGNOSIS — Z90721 Acquired absence of ovaries, unilateral: Secondary | ICD-10-CM | POA: Diagnosis not present

## 2021-03-05 NOTE — Telephone Encounter (Signed)
Received notification from Dr. Sondra Come that patient would like to have surgery as soon as possible to remove her left ovary.  She is worried about the cancer spreading and becoming stage IV. She is in the middle of treatment with 7 fractions of radiation to her pelvis completed and 2 cycles of chemotherapy. Reviewed with Joylene John, NP and patient was added as a telephone visit to discuss with Dr. Berline Lopes this afternoon.  Called [facility] and scheduled phone visit for 3:10 but advised her that Dr. Berline Lopes may call before or after the scheduled time.  She verbalized understanding.

## 2021-03-05 NOTE — Progress Notes (Signed)
Gynecologic Oncology Telehealth Consult Note: Gyn-Onc  I connected with Fawn Kirk on 03/05/21 at  3:10 PM EST by telephone and verified that I am speaking with the correct person using two identifiers.  I discussed the limitations, risks, security and privacy concerns of performing an evaluation and management service by telemedicine and the availability of in-person appointments. I also discussed with the patient that there may be a patient responsible charge related to this service. The patient expressed understanding and agreed to proceed.  Other persons participating in the visit and their role in the encounter: None.  Patient's location: Home Provider's location: [facility]  Reason for Visit: Treatment planning and questions  Treatment History: Oncology History Overview Note  At the GYN Tumor board discussion, overall consensus is cervical cancer with metastasis to right ovary  PD-L1 CPS 10%   Malignant neoplasm of cervix ([place])  10/01/2020 Imaging   US pelvis 1. Large indeterminate right adnexal mass with solid hypervascular and cystic components. This appears separate from the base of the appendix on preceding CT and is likely arising from the right ovary based on relationship to the gonadal vessels on CT. Given the patient's young age, hypervascularity and leukocytosis, findings could represent an atypical tubo-ovarian abscess. However, the appearance is concerning for ovarian malignancy despite the patient's young age. Recommend prompt gynecology consultation. Pelvic MRI without and with contrast may be helpful for further evaluation. 2. The left ovary appears normal. Mild nonspecific thickening of the endometrium, within physiologic limits.   01/02/2021 Pathology Results   SPECIMEN ADEQUACY: Satisfactory for evaluation; transformation zone component PRESENT. INTERPRETATION: - Adenocarcinoma, NOS   01/15/2021 Imaging   MRI pelvis  13.0 cm cystic and  solid mass arising from the right ovary, highly suspicious for ovarian carcinoma.   Mild endometrial thickening and 4.5 cm soft tissue mass within the endocervical canal. Differential diagnosis includes prolapsing endometrial polyp or carcinoma, or primary cervical carcinoma.   No evidence of pelvic metastatic disease.   01/16/2021 Initial Diagnosis   Malignant neoplasm of cervix ([place])   01/22/2021 PET scan   1. Large cystic/necrotic right adnexal mass is markedly hypermetabolic and consistent with known neoplasm. 2. Extensive hypermetabolic tumor involving the endometrium and cervix. 3. Scattered borderline enlarged and mildly hypermetabolic retroperitoneal and external iliac nodes. 4. No findings for metastatic disease involving the chest or bony structures.   01/24/2021 Surgery   Pre-operative Diagnosis: Complex adnexal mass, elevated CA-125, locally invasive cervical adenocarcinoma versus endometrial adenocarcinoma   Post-operative Diagnosis: same, high-grade carcinoma of the right ovary, suspected history of superinfection of the adnexal mass   Operation: Robotic-assisted laparoscopic bilateral salpingectomy, right oophorectomy, lysis of adhesions of approximately 45 minutes, right ureterolysis   Surgeon: Jeral Pinch MD    Operative Findings: On EUA, cervix enlarged and firm, fleshy tumor replacing the endocervix.  No definitive parametrial involvement appreciated.  Even gentle exam because significant bleeding so vagina was packed with lap sponges for the remainder of surgery.  On intra-abdominal exam, normal upper abdominal survey including omentum, liver edge, diaphragm, and stomach.  Normal small bowel.  Some physiologic adhesions of the sigmoid epiploica to the left sidewall.  Normal-appearing left ovary measuring approximately 3-4 cm.  Normal-appearing bilateral fallopian tubes.  Uterus approximately 8-10 cm and overall normal in appearance.  Right ovary replaced by a smooth but  vascular 10 cm mass with multiple loculations.  Ovary with dense adhesions to the right pelvic sidewall.  Upon very careful manipulation of the mass for its  removal, there was some drainage of fluid noted that was somewhat purulent.  This was sent for culture and Gram stain.  Upon contained mass removal with fractionation of the mass, tumor versus necrotic material was noted.  Frozen section consistent with high-grade carcinoma, unable to differentiate on frozen section whether this is the same cancer being seen on her cervical biopsies.  Right ureterolysis performed given oozing services from the broad ligament and peritoneum where adhesions were lysed between the ovary and peritoneum, to assure ureter not in close proximity.  No obvious adenopathy.  No ascites.  Left ovary transposed out of the pelvis along the left gutter, clips placed along the sidewall as well as the superior and inferior aspect of the ovary.     01/24/2021 Pathology Results   FINAL MICROSCOPIC DIAGNOSIS:   A. OVARY AND FALLOPIAN TUBE, RIGHT, SALPINGO OOPHORECTOMY:  - Endometrioid carcinoma, moderately differentiated, involving right ovary  - No evidence of ovarian surface involvement by carcinoma  - Segment of fallopian tube, negative for carcinoma  - See oncology table  - See comment   B. FALLOPIAN TUBE, LEFT, SALPINGECTOMY:  - Segment of fallopian tube, negative for carcinoma   A.  Immunostain for p16 shows patchy staining.  Immunostain for p53 shows a normal, wild-type expression pattern.  This immunoprofile is consistent with above interpretation.  Patient's previous cervical  biopsy was reviewed - it shows moderately differentiated carcinoma with a very similar histomorphology and very likely presents metastasis from the ovarian tumor to the cervix.    02/05/2021 Cancer Staging   Staging form: Cervix Uteri, AJCC Version 9 - Pathologic stage from 02/05/2021: FIGO Stage II (pT2, pN0, cM0) - Signed by [name], MD on  02/05/2021 Stage prefix: Initial diagnosis    02/14/2021 Procedure   Successful placement of a right internal jugular approach power injectable Port-A-Cath. The catheter is ready for immediate use   02/22/2021 -  Chemotherapy   Patient is on Treatment Plan : Cervical cancer Cisplatin q7d       Interval History: Patient reports overall doing well since starting treatment.  She notes some dry skin but otherwise denies any significant pain, vaginal bleeding or discharge.  Reports normal bowel and bladder function.  Past Medical/Surgical History: Past Medical History:  Diagnosis Date   Anemia    Anxiety    Asthma    Depression    Diabetes mellitus without complication ([place])    type 2   Headache    migraine   Morbid obesity ([place])    Ovarian mass     Past Surgical History:  Procedure Laterality Date   IR IMAGING GUIDED PORT INSERTION  02/13/2021   TONSILLECTOMY     XI ROBOTIC ASSISTED OOPHORECTOMY N/A 01/24/2021   Procedure: XI ROBOTIC ASSISTED RIGHT OOPHORECTOMY, OOPHOROPEXY OF LEFT OVARY;  Surgeon: [name], MD;  Location: [facility];  Service: Gynecology;  Laterality: N/A;   XI ROBOTIC ASSISTED SALPINGECTOMY Bilateral 01/24/2021   Procedure: XI ROBOTIC ASSISTED SALPINGECTOMY; RIGHT URETERAL LYSIS; LYSIS OF ADHESIONS;  Surgeon: [name], MD;  Location: [facility];  Service: Gynecology;  Laterality: Bilateral;    Family History  Problem Relation Age of Onset   Asthma Mother    Diabetes Mother    Hypertension Mother    Breast cancer Mother    Asthma Father    Diabetes Father    Kidney disease Father    Diabetes Sister    Asthma Sister    Asthma Brother    Diabetes  Brother    Cancer Maternal Aunt    Diabetes Maternal Uncle    Diabetes Maternal Grandmother    Hypertension Maternal Grandmother    Anemia Maternal Grandmother    Heart attack Paternal Grandfather    Cancer Paternal Grandfather    Colon cancer Neg Hx    Ovarian cancer Neg Hx    Endometrial  cancer Neg Hx    Pancreatic cancer Neg Hx    Prostate cancer Neg Hx     Social History   Socioeconomic History   Marital status: Single    Spouse name: Not on file   Number of children: Not on file   Years of education: Not on file   Highest education level: Not on file  Occupational History   Not on file  Tobacco Use   Smoking status: Never   Smokeless tobacco: Never  Vaping Use   Vaping Use: Never used  Substance and Sexual Activity   Alcohol use: Never   Drug use: Never   Sexual activity: Not Currently    Birth control/protection: None    Comment: female partner  Other Topics Concern   Not on file  Social History Narrative   Not on file   Social Determinants of Health   Financial Resource Strain: Not on file  Food Insecurity: Not on file  Transportation Needs: Not on file  Physical Activity: Not on file  Stress: Not on file  Social Connections: Not on file    Current Medications:  Current Outpatient Medications:    albuterol (VENTOLIN HFA) 108 (90 Base) MCG/ACT inhaler, Inhale 1-2 puffs into the lungs every 6 (six) hours as needed for wheezing or shortness of breath., Disp: , Rfl:    Ascorbic Acid (VITAMIN C PO), Take 1 tablet by mouth once a week., Disp: , Rfl:    Ferrous Sulfate (IRON PO), Take 1 tablet by mouth daily., Disp: , Rfl:    lidocaine-prilocaine (EMLA) cream, Apply to affected area once (Patient not taking: Reported on 02/18/2021), Disp: 30 g, Rfl: 3   metFORMIN (GLUCOPHAGE) 500 MG tablet, Take 1 tablet (500 mg total) by mouth 2 (two) times daily with a meal. (Patient taking differently: Take 500 mg by mouth at bedtime.), Disp: 60 tablet, Rfl: 1   ondansetron (ZOFRAN) 8 MG tablet, Take 1 tablet (8 mg total) by mouth every 8 (eight) hours as needed. (Patient not taking: Reported on 02/18/2021), Disp: 30 tablet, Rfl: 1   prochlorperazine (COMPAZINE) 10 MG tablet, Take 1 tablet (10 mg total) by mouth every 6 (six) hours as needed (Nausea or vomiting).  (Patient not taking: Reported on 02/18/2021), Disp: 30 tablet, Rfl: 1   propranolol (INDERAL) 40 MG tablet, Take 40 mg by mouth daily as needed (migraines)., Disp: , Rfl:    senna-docusate (SENOKOT-S) 8.6-50 MG tablet, Take 2 tablets by mouth at bedtime. Do not take if having loose stools (Patient not taking: Reported on 02/06/2021), Disp: 30 tablet, Rfl: 0  Review of Symptoms: Pertinent positives as per HPI.  Physical Exam: There were no vitals taken for this visit. Deferred given limitations of phone visit.  Laboratory & Radiologic Studies: None new  Assessment & Plan: Alyssa Becker is a 26 y.o. 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  Patient is overall doing well since starting treatment.  She has voiced wanting to get her remaining ovary out.  I explored this today and it sounds like she has had  conversations with her mother who very strongly would like her to get the ovary out so that there is not spread of cancer to the remaining ovary.  The patient and I discussed that based on all of the imaging that we have obtained as well as findings at the time of surgery, there is no evidence of cancer metastasis to the other ovary.  While she and I had previously talked about the possibility of future surgery to remove the ovary (after we established a diagnosis), I stressed the importance of not interrupting treatment at this time.  If we were to proceed with surgery now, that would mean a delay of several weeks before surgery followed by an additional several weeks before reinitiating treatment.  This would be a significant delay to her overall cancer treatment and would provide the efficacy of our treatment plan.  Patient voiced understanding of this.  She understands that we will have a visit after she has finished adjuvant treatment and after we have obtained posttreatment scans.  We can discuss  further at that visit the possibility of removal of her remaining ovary.  I discussed the assessment and treatment plan with the patient. The patient was provided with an opportunity to ask questions and all were answered. The patient agreed with the plan and demonstrated an understanding of the instructions.   The patient was advised to call back or see an in-person evaluation if the symptoms worsen or if the condition fails to improve as anticipated.   18 minutes of total time was spent for this patient encounter, including preparation, face-to-face counseling with the patient and coordination of care, and documentation of the encounter.   Jeral Pinch, MD  Division of Gynecologic Oncology  Department of Obstetrics and Gynecology  [facility]

## 2021-03-06 ENCOUNTER — Ambulatory Visit
Admission: RE | Admit: 2021-03-06 | Discharge: 2021-03-06 | Disposition: A | Discharge status: Home / Self Care | Payer: Medicaid Other | Source: Ambulatory Visit | Attending: Radiation Oncology | Admitting: Radiation Oncology

## 2021-03-06 ENCOUNTER — Inpatient Hospital Stay: Payer: Medicaid Other

## 2021-03-06 DIAGNOSIS — C539 Malignant neoplasm of cervix uteri, unspecified: Secondary | ICD-10-CM

## 2021-03-06 LAB — BASIC METABOLIC PANEL - CANCER CENTER ONLY
Anion gap: 5 (ref 5–15)
BUN: 10 mg/dL (ref 6–20)
CO2: 28 mmol/L (ref 22–32)
Calcium: 9.2 mg/dL (ref 8.9–10.3)
Chloride: 105 mmol/L (ref 98–111)
Creatinine: 0.56 mg/dL (ref 0.44–1.00)
GFR, Estimated: >60 mL/min (ref >60)
Glucose, Bld: 114 mg/dL — ABNORMAL HIGH (ref 70–99)
Potassium: 4 mmol/L (ref 3.5–5.1)
Sodium: 138 mmol/L (ref 135–145)

## 2021-03-06 LAB — CBC WITH DIFFERENTIAL (CANCER CENTER ONLY)
Abs Immature Granulocytes: 0.04 10*3/uL (ref 0.00–0.07)
Basophils Absolute: 0 10*3/uL (ref 0.0–0.1)
Basophils Relative: 0 %
Eosinophils Absolute: 0.2 10*3/uL (ref 0.0–0.5)
Eosinophils Relative: 2 %
HCT: 33.6 % — ABNORMAL LOW (ref 36.0–46.0)
Hemoglobin: 9.9 g/dL — ABNORMAL LOW (ref 12.0–15.0)
Immature Granulocytes: 0 %
Lymphocytes Relative: 13 %
Lymphs Abs: 1.2 10*3/uL (ref 0.7–4.0)
MCH: 19.9 pg — ABNORMAL LOW (ref 26.0–34.0)
MCHC: 29.5 g/dL — ABNORMAL LOW (ref 30.0–36.0)
MCV: 67.6 fL — ABNORMAL LOW (ref 80.0–100.0)
Monocytes Absolute: 0.5 10*3/uL (ref 0.1–1.0)
Monocytes Relative: 6 %
Neutro Abs: 7 10*3/uL (ref 1.7–7.7)
Neutrophils Relative %: 79 %
Platelet Count: 272 10*3/uL (ref 150–400)
RBC: 4.97 MIL/uL (ref 3.87–5.11)
RDW: 29.5 % — ABNORMAL HIGH (ref 11.5–15.5)
WBC Count: 8.9 10*3/uL (ref 4.0–10.5)
nRBC: 0 % (ref 0.0–0.2)

## 2021-03-06 LAB — MAGNESIUM: Magnesium: 1.9 mg/dL (ref 1.7–2.4)

## 2021-03-07 ENCOUNTER — Encounter: Payer: Self-pay | Admitting: Hematology and Oncology

## 2021-03-07 ENCOUNTER — Other Ambulatory Visit: Payer: Self-pay

## 2021-03-07 ENCOUNTER — Ambulatory Visit
Admission: RE | Admit: 2021-03-07 | Discharge: 2021-03-07 | Disposition: A | Discharge status: Home / Self Care | Payer: Medicaid Other | Source: Ambulatory Visit | Attending: Radiation Oncology | Admitting: Radiation Oncology

## 2021-03-07 ENCOUNTER — Inpatient Hospital Stay (HOSPITAL_BASED_OUTPATIENT_CLINIC_OR_DEPARTMENT_OTHER): Payer: Medicaid Other | Admitting: Hematology and Oncology

## 2021-03-07 VITALS — BP 156/73 | HR 78 | Temp 99.4°F | Resp 18 | Ht 66.0 in | Wt 282.4 lb

## 2021-03-07 DIAGNOSIS — E119 Type 2 diabetes mellitus without complications: Secondary | ICD-10-CM

## 2021-03-07 DIAGNOSIS — D509 Iron deficiency anemia, unspecified: Secondary | ICD-10-CM

## 2021-03-07 DIAGNOSIS — Z6841 Body Mass Index (BMI) 40.0 and over, adult: Secondary | ICD-10-CM | POA: Diagnosis not present

## 2021-03-07 DIAGNOSIS — D500 Iron deficiency anemia secondary to blood loss (chronic): Secondary | ICD-10-CM | POA: Diagnosis not present

## 2021-03-07 DIAGNOSIS — C539 Malignant neoplasm of cervix uteri, unspecified: Secondary | ICD-10-CM | POA: Diagnosis not present

## 2021-03-07 MED FILL — Dexamethasone Sodium Phosphate Inj 100 MG/10ML: INTRAMUSCULAR | Qty: 1 | Status: AC

## 2021-03-07 MED FILL — Fosaprepitant Dimeglumine For IV Infusion 150 MG (Base Eq): INTRAVENOUS | Qty: 5 | Status: AC

## 2021-03-07 NOTE — Assessment & Plan Note (Signed)
Her blood sugar is stable Monitor closely

## 2021-03-07 NOTE — Assessment & Plan Note (Signed)
Her blood counts are improving with 2 doses of IV iron I plan to recheck iron studies next week to see if she needs additional IV iron

## 2021-03-07 NOTE — Assessment & Plan Note (Signed)
Overall, she tolerated treatment fairly well We will proceed with treatment without delay

## 2021-03-07 NOTE — Progress Notes (Signed)
[place] OFFICE PROGRESS NOTE  Patient Care Team: Pcp, No as PCP - General [name] Craige Cotta, RN as Oncology Nurse Navigator (Oncology)  ASSESSMENT & PLAN:  Malignant neoplasm of cervix ([place]) Overall, she tolerated treatment fairly well We will proceed with treatment without delay  Iron deficiency anemia due to chronic blood loss Her blood counts are improving with 2 doses of IV iron I plan to recheck iron studies next week to see if she needs additional IV iron  BMI 45.0-49.9, adult ([place]) She has lost some weight since her last treatment I will adjust the dose of her treatment based on her most current weight  Diabetes mellitus without complication ([place]) Her blood sugar is stable Monitor closely  Orders Placed This Encounter  Procedures   Iron and Iron Binding Capacity ([facility] only)    Standing Status:   Future    Standing Expiration Date:   03/07/2022   Ferritin    Standing Status:   Future    Standing Expiration Date:   03/07/2022    All questions were answered. The patient knows to call the clinic with any problems, questions or concerns. The total time spent in the appointment was 20 minutes encounter with patients including review of chart and various tests results, discussions about plan of care and coordination of care plan   [name], MD 03/07/2021 10:14 AM  INTERVAL HISTORY: Please see below for problem oriented charting. she returns for treatment follow-up seen prior to her cisplatin treatment She feels fine Denies nausea or changes in bowel habits No peripheral neuropathy  REVIEW OF SYSTEMS:   Constitutional: Denies fevers, chills or abnormal weight loss Eyes: Denies blurriness of vision Ears, nose, mouth, throat, and face: Denies mucositis or sore throat Respiratory: Denies cough, dyspnea or wheezes Cardiovascular: Denies palpitation, chest discomfort or lower extremity swelling Gastrointestinal:  Denies nausea, heartburn or change in  bowel habits Skin: Denies abnormal skin rashes Lymphatics: Denies new lymphadenopathy or easy bruising Neurological:Denies numbness, tingling or new weaknesses Behavioral/Psych: Mood is stable, no new changes  All other systems were reviewed with the patient and are negative.  I have reviewed the past medical history, past surgical history, social history and family history with the patient and they are unchanged from previous note.  ALLERGIES:  is allergic to pineapple and prednisone.  MEDICATIONS:  Current Outpatient Medications  Medication Sig Dispense Refill   albuterol (VENTOLIN HFA) 108 (90 Base) MCG/ACT inhaler Inhale 1-2 puffs into the lungs every 6 (six) hours as needed for wheezing or shortness of breath.     Ascorbic Acid (VITAMIN C PO) Take 1 tablet by mouth once a week.     Ferrous Sulfate (IRON PO) Take 1 tablet by mouth daily.     lidocaine-prilocaine (EMLA) cream Apply to affected area once (Patient not taking: Reported on 02/18/2021) 30 g 3   metFORMIN (GLUCOPHAGE) 500 MG tablet Take 1 tablet (500 mg total) by mouth 2 (two) times daily with a meal. (Patient taking differently: Take 500 mg by mouth at bedtime.) 60 tablet 1   ondansetron (ZOFRAN) 8 MG tablet Take 1 tablet (8 mg total) by mouth every 8 (eight) hours as needed. (Patient not taking: Reported on 02/18/2021) 30 tablet 1   prochlorperazine (COMPAZINE) 10 MG tablet Take 1 tablet (10 mg total) by mouth every 6 (six) hours as needed (Nausea or vomiting). (Patient not taking: Reported on 02/18/2021) 30 tablet 1   propranolol (INDERAL) 40 MG tablet Take 40 mg by  mouth daily as needed (migraines).     senna-docusate (SENOKOT-S) 8.6-50 MG tablet Take 2 tablets by mouth at bedtime. Do not take if having loose stools (Patient not taking: Reported on 02/06/2021) 30 tablet 0   No current facility-administered medications for this visit.    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 Large indeterminate right adnexal mass with solid hypervascular and cystic components. This appears separate from the base of the appendix on preceding CT and is likely arising from the right ovary based on relationship to the gonadal vessels on CT. Given the patient's young age, hypervascularity and leukocytosis, findings could represent an atypical tubo-ovarian abscess. However, the appearance is concerning for ovarian malignancy despite the patient's young age. Recommend prompt gynecology consultation. Pelvic MRI without and with contrast may be helpful for further evaluation. 2. The left ovary appears normal. Mild nonspecific thickening of the endometrium, within physiologic limits.   01/02/2021 Pathology Results   SPECIMEN ADEQUACY: Satisfactory for evaluation; transformation zone component PRESENT. INTERPRETATION: - Adenocarcinoma, NOS   01/15/2021 Imaging   MRI pelvis  13.0 cm cystic and solid mass arising from the right ovary, highly suspicious for ovarian carcinoma.   Mild endometrial thickening and 4.5 cm soft tissue mass within the endocervical canal. Differential diagnosis includes prolapsing endometrial polyp or carcinoma, or primary cervical carcinoma.   No evidence of pelvic metastatic disease.   01/16/2021 Initial Diagnosis   Malignant neoplasm of cervix ([place])   01/22/2021 PET scan   1. Large cystic/necrotic right adnexal mass is markedly hypermetabolic and consistent with known neoplasm. 2. Extensive hypermetabolic tumor involving the endometrium and cervix. 3. Scattered borderline enlarged and mildly hypermetabolic retroperitoneal and external iliac nodes. 4. No findings for metastatic disease involving the chest or bony structures.   01/24/2021 Surgery   Pre-operative Diagnosis: Complex adnexal mass,  elevated CA-125, locally invasive cervical adenocarcinoma versus endometrial adenocarcinoma   Post-operative Diagnosis: same, high-grade carcinoma of the right ovary, suspected history of superinfection of the adnexal mass   Operation: Robotic-assisted laparoscopic bilateral salpingectomy, right oophorectomy, lysis of adhesions of approximately 45 minutes, right ureterolysis   Surgeon: Jeral Pinch MD    Operative Findings: On EUA, cervix enlarged and firm, fleshy tumor replacing the endocervix.  No definitive parametrial involvement appreciated.  Even gentle exam because significant bleeding so vagina was packed with lap sponges for the remainder of surgery.  On intra-abdominal exam, normal upper abdominal survey including omentum, liver edge, diaphragm, and stomach.  Normal small bowel.  Some physiologic adhesions of the sigmoid epiploica to the left sidewall.  Normal-appearing left ovary measuring approximately 3-4 cm.  Normal-appearing bilateral fallopian tubes.  Uterus approximately 8-10 cm and overall normal in appearance.  Right ovary replaced by a smooth but vascular 10 cm mass with multiple loculations.  Ovary with dense adhesions to the right pelvic sidewall.  Upon very careful manipulation of the mass for its removal, there was some drainage of fluid noted that was somewhat purulent.  This was sent for culture and Gram stain.  Upon contained mass removal with fractionation of the mass, tumor versus necrotic material was noted.  Frozen section consistent with high-grade carcinoma, unable to differentiate on frozen section whether this is the same cancer being seen on her cervical biopsies.  Right ureterolysis performed given  oozing services from the broad ligament and peritoneum where adhesions were lysed between the ovary and peritoneum, to assure ureter not in close proximity.  No obvious adenopathy.  No ascites.  Left ovary transposed out of the pelvis along the left gutter, clips placed  along the sidewall as well as the superior and inferior aspect of the ovary.     01/24/2021 Pathology Results   FINAL MICROSCOPIC DIAGNOSIS:   A. OVARY AND FALLOPIAN TUBE, RIGHT, SALPINGO OOPHORECTOMY:  - Endometrioid carcinoma, moderately differentiated, involving right ovary  - No evidence of ovarian surface involvement by carcinoma  - Segment of fallopian tube, negative for carcinoma  - See oncology table  - See comment   B. FALLOPIAN TUBE, LEFT, SALPINGECTOMY:  - Segment of fallopian tube, negative for carcinoma   A.  Immunostain for p16 shows patchy staining.  Immunostain for p53 shows a normal, wild-type expression pattern.  This immunoprofile is consistent with above interpretation.  Patient's previous cervical  biopsy was reviewed - it shows moderately differentiated carcinoma with a very similar histomorphology and very likely presents metastasis from the ovarian tumor to the cervix.    02/05/2021 Cancer Staging   Staging form: Cervix Uteri, AJCC Version 9 - Pathologic stage from 02/05/2021: FIGO Stage II (pT2, pN0, cM0) - Signed by [name], MD on 02/05/2021 Stage prefix: Initial diagnosis    02/14/2021 Procedure   Successful placement of a right internal jugular approach power injectable Port-A-Cath. The catheter is ready for immediate use   02/22/2021 -  Chemotherapy   Patient is on Treatment Plan : Cervical cancer Cisplatin q7d       PHYSICAL EXAMINATION: ECOG PERFORMANCE STATUS: 0 - Asymptomatic  Vitals:   03/07/21 0941  BP: (!) 156/73  Pulse: 78  Resp: 18  Temp: 99.4 F (37.4 C)  SpO2: 99%   Filed Weights   03/07/21 0941  Weight: 282 lb 6.4 oz (128.1 kg)    GENERAL:alert, no distress and comfortable SKIN: skin color, texture, turgor are normal, no rashes or significant lesions EYES: normal, Conjunctiva are pink and non-injected, sclera clear OROPHARYNX:no exudate, no erythema and lips, buccal mucosa, and tongue normal  NECK: supple, thyroid normal  size, non-tender, without nodularity LYMPH:  no palpable lymphadenopathy in the cervical, axillary or inguinal LUNGS: clear to auscultation and percussion with normal breathing effort HEART: regular rate & rhythm and no murmurs and no lower extremity edema ABDOMEN:abdomen soft, non-tender and normal bowel sounds Musculoskeletal:no cyanosis of digits and no clubbing  NEURO: alert & oriented x 3 with fluent speech, no focal motor/sensory deficits  LABORATORY DATA:  I have reviewed the data as listed    Component Value Date/Time   NA 138 03/06/2021 1004   K 4.0 03/06/2021 1004   CL 105 03/06/2021 1004   CO2 28 03/06/2021 1004   GLUCOSE 114 (H) 03/06/2021 1004   BUN 10 03/06/2021 1004   CREATININE 0.56 03/06/2021 1004   CALCIUM 9.2 03/06/2021 1004   PROT 8.1 01/18/2021 0855   ALBUMIN 4.3 01/18/2021 0855   AST 12 (L) 01/18/2021 0855   ALT 11 01/18/2021 0855   ALKPHOS 92 01/18/2021 0855   BILITOT 0.3 01/18/2021 0855   GFRNONAA >60 03/06/2021 1004   GFRAA >60 05/18/2019 1957    No results found for: SPEP, UPEP  Lab Results  Component Value Date   WBC 8.9 03/06/2021   NEUTROABS 7.0 03/06/2021   HGB 9.9 (L) 03/06/2021   HCT 33.6 (L) 03/06/2021   MCV 67.6 (L)  03/06/2021   PLT 272 03/06/2021      Chemistry      Component Value Date/Time   NA 138 03/06/2021 1004   K 4.0 03/06/2021 1004   CL 105 03/06/2021 1004   CO2 28 03/06/2021 1004   BUN 10 03/06/2021 1004   CREATININE 0.56 03/06/2021 1004      Component Value Date/Time   CALCIUM 9.2 03/06/2021 1004   ALKPHOS 92 01/18/2021 0855   AST 12 (L) 01/18/2021 0855   ALT 11 01/18/2021 0855   BILITOT 0.3 01/18/2021 0855

## 2021-03-07 NOTE — Assessment & Plan Note (Signed)
She has lost some weight since her last treatment I will adjust the dose of her treatment based on her most current weight

## 2021-03-08 ENCOUNTER — Inpatient Hospital Stay: Payer: Medicaid Other

## 2021-03-08 ENCOUNTER — Ambulatory Visit
Admission: RE | Admit: 2021-03-08 | Discharge: 2021-03-08 | Disposition: A | Discharge status: Home / Self Care | Payer: Medicaid Other | Source: Ambulatory Visit | Attending: Radiation Oncology | Admitting: Radiation Oncology

## 2021-03-08 VITALS — BP 148/70 | HR 99 | Temp 98.0°F | Resp 18 | Wt 286.8 lb

## 2021-03-08 DIAGNOSIS — C539 Malignant neoplasm of cervix uteri, unspecified: Secondary | ICD-10-CM

## 2021-03-08 MED ORDER — SODIUM CHLORIDE 0.9 % IV SOLN: Freq: Once | INTRAVENOUS | Status: AC

## 2021-03-08 MED ORDER — PALONOSETRON HCL INJECTION 0.25 MG/5ML
0.2500 mg | Freq: Once | INTRAVENOUS | Status: AC
  Administered 2021-03-08: 0.25 mg via INTRAVENOUS
  Filled 2021-03-08: qty 5

## 2021-03-08 MED ORDER — SODIUM CHLORIDE 0.9 % IV SOLN
10.0000 mg | Freq: Once | INTRAVENOUS | Status: AC
  Administered 2021-03-08: 10 mg via INTRAVENOUS
  Filled 2021-03-08: qty 10

## 2021-03-08 MED ORDER — SODIUM CHLORIDE 0.9 % IV SOLN
150.0000 mg | Freq: Once | INTRAVENOUS | Status: AC
  Administered 2021-03-08: 150 mg via INTRAVENOUS
  Filled 2021-03-08: qty 150

## 2021-03-08 MED ORDER — MAGNESIUM SULFATE 2 GM/50ML IV SOLN
2.0000 g | Freq: Once | INTRAVENOUS | Status: AC
  Administered 2021-03-08: 2 g via INTRAVENOUS
  Filled 2021-03-08: qty 50

## 2021-03-08 MED ORDER — SODIUM CHLORIDE 0.9 % IV SOLN
32.0000 mg/m2 | Freq: Once | INTRAVENOUS | Status: AC
  Administered 2021-03-08: 78 mg via INTRAVENOUS
  Filled 2021-03-08: qty 78

## 2021-03-08 MED ORDER — POTASSIUM CHLORIDE IN NACL 20-0.9 MEQ/L-% IV SOLN
Freq: Once | INTRAVENOUS | Status: AC
  Filled 2021-03-08: qty 1000

## 2021-03-08 NOTE — Patient Instructions (Signed)
[facility]  Discharge Instructions: °Thank you for choosing [facility] to provide your oncology and hematology care.  ° °If you have a lab appointment with the Cancer Center, please go directly to the Cancer Center and check in at the registration area. °  °Wear comfortable clothing and clothing appropriate for easy access to any Portacath or PICC line.  ° °We strive to give you quality time with your provider. You may need to reschedule your appointment if you arrive late (15 or more minutes).  Arriving late affects you and other patients whose appointments are after yours.  Also, if you miss three or more appointments without notifying the office, you may be dismissed from the clinic at the provider’s discretion.    °  °For prescription refill requests, have your pharmacy contact our office and allow 72 hours for refills to be completed.   ° °Today you received the following chemotherapy and/or immunotherapy agents Cisplatin    °  °To help prevent nausea and vomiting after your treatment, we encourage you to take your nausea medication as directed. ° °BELOW ARE SYMPTOMS THAT SHOULD BE REPORTED IMMEDIATELY: °*FEVER GREATER THAN 100.4 F (38 °C) OR HIGHER °*CHILLS OR SWEATING °*NAUSEA AND VOMITING THAT IS NOT CONTROLLED WITH YOUR NAUSEA MEDICATION °*UNUSUAL SHORTNESS OF BREATH °*UNUSUAL BRUISING OR BLEEDING °*URINARY PROBLEMS (pain or burning when urinating, or frequent urination) °*BOWEL PROBLEMS (unusual diarrhea, constipation, pain near the anus) °TENDERNESS IN MOUTH AND THROAT WITH OR WITHOUT PRESENCE OF ULCERS (sore throat, sores in mouth, or a toothache) °UNUSUAL RASH, SWELLING OR PAIN  °UNUSUAL VAGINAL DISCHARGE OR ITCHING  ° °Items with * indicate a potential emergency and should be followed up as soon as possible or go to the Emergency Department if any problems should occur. ° °Please show the CHEMOTHERAPY ALERT CARD or IMMUNOTHERAPY ALERT CARD at check-in to  the Emergency Department and triage nurse. ° °Should you have questions after your visit or need to cancel or reschedule your appointment, please contact [facility]  Dept: [phone]  and follow the prompts.  Office hours are 8:00 a.m. to 4:30 p.m. Monday - Friday. Please note that voicemails left after 4:00 p.m. may not be returned until the following business day.  We are closed weekends and major holidays. You have access to a nurse at all times for urgent questions. Please call the main number to the clinic Dept: [phone] and follow the prompts. ° ° °For any non-urgent questions, you may also contact your provider using MyChart. We now offer e-Visits for anyone 18 and older to request care online for non-urgent symptoms. For details visit [email or web address]. °  °Also download the MyChart app! Go to the app store, search "MyChart", open the app, select [facility], and log in with your MyChart username and password. ° °Due to Covid, a mask is required upon entering the hospital/clinic. If you do not have a mask, one will be given to you upon arrival. For doctor visits, patients may have 1 support person aged 18 or older with them. For treatment visits, patients cannot have anyone with them due to current Covid guidelines and our immunocompromised population.  ° °Cisplatin injection °What is this medication? °CISPLATIN (SIS pla tin) is a chemotherapy drug. It targets fast dividing cells, like cancer cells, and causes these cells to die. This medicine is used to treat many types of cancer like bladder, ovarian, and testicular cancers. °This medicine may be used   for other purposes; ask your health care provider or pharmacist if you have questions. °COMMON BRAND NAME(S): Platinol, Platinol -AQ °What should I tell my care team before I take this medication? °They need to know if you have any of these conditions: °eye disease, vision problems °hearing problems °kidney  disease °low blood counts, like white cells, platelets, or red blood cells °tingling of the fingers or toes, or other nerve disorder °an unusual or allergic reaction to cisplatin, carboplatin, oxaliplatin, other medicines, foods, dyes, or preservatives °pregnant or trying to get pregnant °breast-feeding °How should I use this medication? °This drug is given as an infusion into a vein. It is administered in a hospital or clinic by a specially trained health care professional. °Talk to your pediatrician regarding the use of this medicine in children. Special care may be needed. °Overdosage: If you think you have taken too much of this medicine contact a poison control center or emergency room at once. °NOTE: This medicine is only for you. Do not share this medicine with others. °What if I miss a dose? °It is important not to miss a dose. Call your doctor or health care professional if you are unable to keep an appointment. °What may interact with this medication? °This medicine may interact with the following medications: °foscarnet °certain antibiotics like amikacin, gentamicin, neomycin, polymyxin B, streptomycin, tobramycin, vancomycin °This list may not describe all possible interactions. Give your health care provider a list of all the medicines, herbs, non-prescription drugs, or dietary supplements you use. Also tell them if you smoke, drink alcohol, or use illegal drugs. Some items may interact with your medicine. °What should I watch for while using this medication? °Your condition will be monitored carefully while you are receiving this medicine. You will need important blood work done while you are taking this medicine. °This drug may make you feel generally unwell. This is not uncommon, as chemotherapy can affect healthy cells as well as cancer cells. Report any side effects. Continue your course of treatment even though you feel ill unless your doctor tells you to stop. °This medicine may increase your  risk of getting an infection. Call your healthcare professional for advice if you get a fever, chills, or sore throat, or other symptoms of a cold or flu. Do not treat yourself. Try to avoid being around people who are sick. °Avoid taking medicines that contain aspirin, acetaminophen, ibuprofen, naproxen, or ketoprofen unless instructed by your healthcare professional. These medicines may hide a fever. °This medicine may increase your risk to bruise or bleed. Call your doctor or health care professional if you notice any unusual bleeding. °Be careful brushing and flossing your teeth or using a toothpick because you may get an infection or bleed more easily. If you have any dental work done, tell your dentist you are receiving this medicine. °Do not become pregnant while taking this medicine or for 14 months after stopping it. Women should inform their healthcare professional if they wish to become pregnant or think they might be pregnant. Men should not father a child while taking this medicine and for 11 months after stopping it. There is potential for serious side effects to an unborn child. Talk to your healthcare professional for more information. °Do not breast-feed an infant while taking this medicine. °This medicine has caused ovarian failure in some women. This medicine may make it more difficult to get pregnant. Talk to your healthcare professional if you are concerned about your fertility. °This medicine has caused   decreased sperm counts in some men. This may make it more difficult to father a child. Talk to your healthcare professional if you are concerned about your fertility. °Drink fluids as directed while you are taking this medicine. This will help protect your kidneys. °Call your doctor or health care professional if you get diarrhea. Do not treat yourself. °What side effects may I notice from receiving this medication? °Side effects that you should report to your doctor or health care professional  as soon as possible: °allergic reactions like skin rash, itching or hives, swelling of the face, lips, or tongue °blurred vision °changes in vision °decreased hearing or ringing of the ears °nausea, vomiting °pain, redness, or irritation at site where injected °pain, tingling, numbness in the hands or feet °signs and symptoms of bleeding such as bloody or black, tarry stools; red or dark brown urine; spitting up blood or brown material that looks like coffee grounds; red spots on the skin; unusual bruising or bleeding from the eyes, gums, or nose °signs and symptoms of infection like fever; chills; cough; sore throat; pain or trouble passing urine °signs and symptoms of kidney injury like trouble passing urine or change in the amount of urine °signs and symptoms of low red blood cells or anemia such as unusually weak or tired; feeling faint or lightheaded; falls; breathing problems °Side effects that usually do not require medical attention (report to your doctor or health care professional if they continue or are bothersome): °loss of appetite °mouth sores °muscle cramps °This list may not describe all possible side effects. Call your doctor for medical advice about side effects. You may report side effects to FDA at 1-800-FDA-1088. °Where should I keep my medication? °This drug is given in a hospital or clinic and will not be stored at home. °NOTE: This sheet is a summary. It may not cover all possible information. If you have questions about this medicine, talk to your doctor, pharmacist, or health care provider. °© 2022 [employer]vier/Gold Standard (2020-09-25 00:00:00) ° ° °

## 2021-03-11 ENCOUNTER — Ambulatory Visit
Admission: RE | Admit: 2021-03-11 | Discharge: 2021-03-11 | Disposition: A | Discharge status: Home / Self Care | Payer: Medicaid Other | Source: Ambulatory Visit | Attending: Radiation Oncology | Admitting: Radiation Oncology

## 2021-03-11 ENCOUNTER — Other Ambulatory Visit: Payer: Self-pay

## 2021-03-11 DIAGNOSIS — C539 Malignant neoplasm of cervix uteri, unspecified: Secondary | ICD-10-CM | POA: Diagnosis not present

## 2021-03-12 ENCOUNTER — Ambulatory Visit
Admission: RE | Admit: 2021-03-12 | Discharge: 2021-03-12 | Disposition: A | Discharge status: Home / Self Care | Payer: Medicaid Other | Source: Ambulatory Visit | Attending: Radiation Oncology | Admitting: Radiation Oncology

## 2021-03-12 DIAGNOSIS — C539 Malignant neoplasm of cervix uteri, unspecified: Secondary | ICD-10-CM | POA: Diagnosis not present

## 2021-03-13 ENCOUNTER — Inpatient Hospital Stay: Payer: Medicaid Other

## 2021-03-13 ENCOUNTER — Other Ambulatory Visit: Payer: Self-pay

## 2021-03-13 ENCOUNTER — Ambulatory Visit
Admission: RE | Admit: 2021-03-13 | Discharge: 2021-03-13 | Disposition: A | Discharge status: Home / Self Care | Payer: Medicaid Other | Source: Ambulatory Visit | Attending: Radiation Oncology | Admitting: Radiation Oncology

## 2021-03-13 DIAGNOSIS — C539 Malignant neoplasm of cervix uteri, unspecified: Secondary | ICD-10-CM | POA: Diagnosis not present

## 2021-03-13 DIAGNOSIS — D500 Iron deficiency anemia secondary to blood loss (chronic): Secondary | ICD-10-CM

## 2021-03-13 LAB — CBC WITH DIFFERENTIAL (CANCER CENTER ONLY)
Abs Immature Granulocytes: 0.01 10*3/uL (ref 0.00–0.07)
Basophils Absolute: 0 10*3/uL (ref 0.0–0.1)
Basophils Relative: 0 %
Eosinophils Absolute: 0.2 10*3/uL (ref 0.0–0.5)
Eosinophils Relative: 4 %
HCT: 34.5 % — ABNORMAL LOW (ref 36.0–46.0)
Hemoglobin: 10.3 g/dL — ABNORMAL LOW (ref 12.0–15.0)
Immature Granulocytes: 0 %
Lymphocytes Relative: 17 %
Lymphs Abs: 0.9 10*3/uL (ref 0.7–4.0)
MCH: 21.2 pg — ABNORMAL LOW (ref 26.0–34.0)
MCHC: 29.9 g/dL — ABNORMAL LOW (ref 30.0–36.0)
MCV: 71 fL — ABNORMAL LOW (ref 80.0–100.0)
Monocytes Absolute: 0.3 10*3/uL (ref 0.1–1.0)
Monocytes Relative: 6 %
Neutro Abs: 3.9 10*3/uL (ref 1.7–7.7)
Neutrophils Relative %: 73 %
Platelet Count: 270 10*3/uL (ref 150–400)
RBC: 4.86 MIL/uL (ref 3.87–5.11)
RDW: 32.4 % — ABNORMAL HIGH (ref 11.5–15.5)
WBC Count: 5.4 10*3/uL (ref 4.0–10.5)
nRBC: 0 % (ref 0.0–0.2)

## 2021-03-13 LAB — BASIC METABOLIC PANEL - CANCER CENTER ONLY
Anion gap: 5 (ref 5–15)
BUN: 12 mg/dL (ref 6–20)
CO2: 29 mmol/L (ref 22–32)
Calcium: 9 mg/dL (ref 8.9–10.3)
Chloride: 104 mmol/L (ref 98–111)
Creatinine: 0.53 mg/dL (ref 0.44–1.00)
GFR, Estimated: >60 mL/min (ref >60)
Glucose, Bld: 104 mg/dL — ABNORMAL HIGH (ref 70–99)
Potassium: 3.9 mmol/L (ref 3.5–5.1)
Sodium: 138 mmol/L (ref 135–145)

## 2021-03-13 LAB — IRON AND IRON BINDING CAPACITY (CC-WL,HP ONLY)
Iron: 53 ug/dL (ref 28–170)
Saturation Ratios: 20 % (ref 10.4–31.8)
TIBC: 263 ug/dL (ref 250–450)
UIBC: 210 ug/dL (ref 148–442)

## 2021-03-13 LAB — FERRITIN: Ferritin: 153 ng/mL (ref 11–307)

## 2021-03-13 LAB — MAGNESIUM: Magnesium: 1.9 mg/dL (ref 1.7–2.4)

## 2021-03-14 ENCOUNTER — Ambulatory Visit
Admission: RE | Admit: 2021-03-14 | Discharge: 2021-03-14 | Disposition: A | Discharge status: Home / Self Care | Payer: Medicaid Other | Source: Ambulatory Visit | Attending: Radiation Oncology | Admitting: Radiation Oncology

## 2021-03-14 ENCOUNTER — Inpatient Hospital Stay: Payer: Medicaid Other | Admitting: Hematology and Oncology

## 2021-03-14 ENCOUNTER — Encounter: Payer: Self-pay | Admitting: Hematology and Oncology

## 2021-03-14 DIAGNOSIS — R634 Abnormal weight loss: Secondary | ICD-10-CM | POA: Diagnosis not present

## 2021-03-14 DIAGNOSIS — C539 Malignant neoplasm of cervix uteri, unspecified: Secondary | ICD-10-CM | POA: Diagnosis not present

## 2021-03-14 DIAGNOSIS — R197 Diarrhea, unspecified: Secondary | ICD-10-CM

## 2021-03-14 DIAGNOSIS — D500 Iron deficiency anemia secondary to blood loss (chronic): Secondary | ICD-10-CM

## 2021-03-14 MED FILL — Fosaprepitant Dimeglumine For IV Infusion 150 MG (Base Eq): INTRAVENOUS | Qty: 5 | Status: AC

## 2021-03-14 MED FILL — Dexamethasone Sodium Phosphate Inj 100 MG/10ML: INTRAMUSCULAR | Qty: 1 | Status: AC

## 2021-03-14 NOTE — Assessment & Plan Note (Signed)
After significant IV iron replacement therapy, her iron studies are normal Her anemia is improving She does not need further IV iron treatment for now

## 2021-03-14 NOTE — Progress Notes (Signed)
[facility] OFFICE PROGRESS NOTE  Patient Care Team: Pcp, No as PCP - General [name] Craige Cotta, RN as Oncology Nurse Navigator (Oncology)  ASSESSMENT & PLAN:  Malignant neoplasm of cervix ([place]) I explained the rationale of completing chemoradiation therapy before consideration for contralateral oophorectomy After several minutes of explanation, eventually the patient and her mother understood the rationale of not stopping treatment right now The plan will be to complete chemoradiation therapy and wait minimum 2 months before consideration for surgery  Iron deficiency anemia due to chronic blood loss After significant IV iron replacement therapy, her iron studies are normal Her anemia is improving She does not need further IV iron treatment for now  Diarrhea She has diarrhea and dysuria likely secondary to side effects of radiation I recommend the patient to take Imodium as needed  Weight loss She has mild weight loss compared to last week I will readjust the dose of her treatment  No orders of the defined types were placed in this encounter.   All questions were answered. The patient knows to call the clinic with any problems, questions or concerns. The total time spent in the appointment was 30 minutes encounter with patients including review of chart and various tests results, discussions about plan of care and coordination of care plan   [name], MD 03/14/2021 3:47 PM  INTERVAL HISTORY: Please see below for problem oriented charting. she returns for treatment follow-up She is here accompanied by her mother The patient and her mother are visibly upset Her mother is concerned that she might develop cancer in the remaining ovary and requests that she proceed with complete oophorectomy The patient stated, "it is my body and I want the ovary out" Both of them stated that she was not given the option by GYN surgeon From her previous chemotherapy perspective, she  tolerated well except for loose stool and mild sensation of discomfort when she urinates No fever, chills or nausea  REVIEW OF SYSTEMS:   Constitutional: Denies fevers, chills or abnormal weight loss Eyes: Denies blurriness of vision Ears, nose, mouth, throat, and face: Denies mucositis or sore throat Respiratory: Denies cough, dyspnea or wheezes Cardiovascular: Denies palpitation, chest discomfort or lower extremity swelling Skin: Denies abnormal skin rashes Lymphatics: Denies new lymphadenopathy or easy bruising Neurological:Denies numbness, tingling or new weaknesses Behavioral/Psych: Mood is stable, no new changes  All other systems were reviewed with the patient and are negative.  I have reviewed the past medical history, past surgical history, social history and family history with the patient and they are unchanged from previous note.  ALLERGIES:  is allergic to pineapple and prednisone.  MEDICATIONS:  Current Outpatient Medications  Medication Sig Dispense Refill   albuterol (VENTOLIN HFA) 108 (90 Base) MCG/ACT inhaler Inhale 1-2 puffs into the lungs every 6 (six) hours as needed for wheezing or shortness of breath.     Ascorbic Acid (VITAMIN C PO) Take 1 tablet by mouth once a week.     lidocaine-prilocaine (EMLA) cream Apply to affected area once (Patient not taking: Reported on 02/18/2021) 30 g 3   metFORMIN (GLUCOPHAGE) 500 MG tablet Take 1 tablet (500 mg total) by mouth 2 (two) times daily with a meal. (Patient taking differently: Take 500 mg by mouth at bedtime.) 60 tablet 1   ondansetron (ZOFRAN) 8 MG tablet Take 1 tablet (8 mg total) by mouth every 8 (eight) hours as needed. (Patient not taking: Reported on 02/18/2021) 30 tablet 1   prochlorperazine (COMPAZINE)  10 MG tablet Take 1 tablet (10 mg total) by mouth every 6 (six) hours as needed (Nausea or vomiting). (Patient not taking: Reported on 02/18/2021) 30 tablet 1   propranolol (INDERAL) 40 MG tablet Take 40 mg by mouth  daily as needed (migraines).     No current facility-administered medications for this visit.    SUMMARY OF ONCOLOGIC HISTORY: Oncology History Overview Note  At the GYN Tumor board discussion, overall consensus is cervical cancer with metastasis to right ovary  PD-L1 CPS 10%   Malignant neoplasm of cervix ([place])  10/01/2020 Imaging   US pelvis 1. Large indeterminate right adnexal mass with solid hypervascular and cystic components. This appears separate from the base of the appendix on preceding CT and is likely arising from the right ovary based on relationship to the gonadal vessels on CT. Given the patient's young age, hypervascularity and leukocytosis, findings could represent an atypical tubo-ovarian abscess. However, the appearance is concerning for ovarian malignancy despite the patient's young age. Recommend prompt gynecology consultation. Pelvic MRI without and with contrast may be helpful for further evaluation. 2. The left ovary appears normal. Mild nonspecific thickening of the endometrium, within physiologic limits.   01/02/2021 Pathology Results   SPECIMEN ADEQUACY: Satisfactory for evaluation; transformation zone component PRESENT. INTERPRETATION: - Adenocarcinoma, NOS   01/15/2021 Imaging   MRI pelvis  13.0 cm cystic and solid mass arising from the right ovary, highly suspicious for ovarian carcinoma.   Mild endometrial thickening and 4.5 cm soft tissue mass within the endocervical canal. Differential diagnosis includes prolapsing endometrial polyp or carcinoma, or primary cervical carcinoma.   No evidence of pelvic metastatic disease.   01/16/2021 Initial Diagnosis   Malignant neoplasm of cervix ([place])   01/22/2021 PET scan   1. Large cystic/necrotic right adnexal mass is markedly hypermetabolic and consistent with known neoplasm. 2. Extensive hypermetabolic tumor involving the endometrium and cervix. 3. Scattered borderline enlarged and mildly hypermetabolic  retroperitoneal and external iliac nodes. 4. No findings for metastatic disease involving the chest or bony structures.   01/24/2021 Surgery   Pre-operative Diagnosis: Complex adnexal mass, elevated CA-125, locally invasive cervical adenocarcinoma versus endometrial adenocarcinoma   Post-operative Diagnosis: same, high-grade carcinoma of the right ovary, suspected history of superinfection of the adnexal mass   Operation: Robotic-assisted laparoscopic bilateral salpingectomy, right oophorectomy, lysis of adhesions of approximately 45 minutes, right ureterolysis   Surgeon: Jeral Pinch MD    Operative Findings: On EUA, cervix enlarged and firm, fleshy tumor replacing the endocervix.  No definitive parametrial involvement appreciated.  Even gentle exam because significant bleeding so vagina was packed with lap sponges for the remainder of surgery.  On intra-abdominal exam, normal upper abdominal survey including omentum, liver edge, diaphragm, and stomach.  Normal small bowel.  Some physiologic adhesions of the sigmoid epiploica to the left sidewall.  Normal-appearing left ovary measuring approximately 3-4 cm.  Normal-appearing bilateral fallopian tubes.  Uterus approximately 8-10 cm and overall normal in appearance.  Right ovary replaced by a smooth but vascular 10 cm mass with multiple loculations.  Ovary with dense adhesions to the right pelvic sidewall.  Upon very careful manipulation of the mass for its removal, there was some drainage of fluid noted that was somewhat purulent.  This was sent for culture and Gram stain.  Upon contained mass removal with fractionation of the mass, tumor versus necrotic material was noted.  Frozen section consistent with high-grade carcinoma, unable to differentiate on frozen section whether this is the same cancer being  seen on her cervical biopsies.  Right ureterolysis performed given oozing services from the broad ligament and peritoneum where adhesions were lysed  between the ovary and peritoneum, to assure ureter not in close proximity.  No obvious adenopathy.  No ascites.  Left ovary transposed out of the pelvis along the left gutter, clips placed along the sidewall as well as the superior and inferior aspect of the ovary.     01/24/2021 Pathology Results   FINAL MICROSCOPIC DIAGNOSIS:   A. OVARY AND FALLOPIAN TUBE, RIGHT, SALPINGO OOPHORECTOMY:  - Endometrioid carcinoma, moderately differentiated, involving right ovary  - No evidence of ovarian surface involvement by carcinoma  - Segment of fallopian tube, negative for carcinoma  - See oncology table  - See comment   B. FALLOPIAN TUBE, LEFT, SALPINGECTOMY:  - Segment of fallopian tube, negative for carcinoma   A.  Immunostain for p16 shows patchy staining.  Immunostain for p53 shows a normal, wild-type expression pattern.  This immunoprofile is consistent with above interpretation.  Patient's previous cervical  biopsy was reviewed - it shows moderately differentiated carcinoma with a very similar histomorphology and very likely presents metastasis from the ovarian tumor to the cervix.    02/05/2021 Cancer Staging   Staging form: Cervix Uteri, AJCC Version 9 - Pathologic stage from 02/05/2021: FIGO Stage II (pT2, pN0, cM0) - Signed by [name], MD on 02/05/2021 Stage prefix: Initial diagnosis    02/14/2021 Procedure   Successful placement of a right internal jugular approach power injectable Port-A-Cath. The catheter is ready for immediate use   02/22/2021 -  Chemotherapy   Patient is on Treatment Plan : Cervical cancer Cisplatin q7d       PHYSICAL EXAMINATION: ECOG PERFORMANCE STATUS: 1 - Symptomatic but completely ambulatory  Vitals:   03/14/21 0959  BP: (!) 142/73  Pulse: 80  Resp: 18  Temp: 98.1 F (36.7 C)  SpO2: 100%   Filed Weights   03/14/21 0959  Weight: 286 lb 6.4 oz (129.9 kg)    GENERAL:alert, no distress and comfortable NEURO: alert & oriented x 3 with fluent  speech, no focal motor/sensory deficits  LABORATORY DATA:  I have reviewed the data as listed    Component Value Date/Time   NA 138 03/13/2021 1003   K 3.9 03/13/2021 1003   CL 104 03/13/2021 1003   CO2 29 03/13/2021 1003   GLUCOSE 104 (H) 03/13/2021 1003   BUN 12 03/13/2021 1003   CREATININE 0.53 03/13/2021 1003   CALCIUM 9.0 03/13/2021 1003   PROT 8.1 01/18/2021 0855   ALBUMIN 4.3 01/18/2021 0855   AST 12 (L) 01/18/2021 0855   ALT 11 01/18/2021 0855   ALKPHOS 92 01/18/2021 0855   BILITOT 0.3 01/18/2021 0855   GFRNONAA >60 03/13/2021 1003   GFRAA >60 05/18/2019 1957    No results found for: SPEP, UPEP  Lab Results  Component Value Date   WBC 5.4 03/13/2021   NEUTROABS 3.9 03/13/2021   HGB 10.3 (L) 03/13/2021   HCT 34.5 (L) 03/13/2021   MCV 71.0 (L) 03/13/2021   PLT 270 03/13/2021      Chemistry      Component Value Date/Time   NA 138 03/13/2021 1003   K 3.9 03/13/2021 1003   CL 104 03/13/2021 1003   CO2 29 03/13/2021 1003   BUN 12 03/13/2021 1003   CREATININE 0.53 03/13/2021 1003      Component Value Date/Time   CALCIUM 9.0 03/13/2021 1003   ALKPHOS 92 01/18/2021 0855  AST 12 (L) 01/18/2021 0855   ALT 11 01/18/2021 0855   BILITOT 0.3 01/18/2021 0855

## 2021-03-14 NOTE — Assessment & Plan Note (Signed)
She has mild weight loss compared to last week I will readjust the dose of her treatment

## 2021-03-14 NOTE — Assessment & Plan Note (Signed)
I explained the rationale of completing chemoradiation therapy before consideration for contralateral oophorectomy After several minutes of explanation, eventually the patient and her mother understood the rationale of not stopping treatment right now The plan will be to complete chemoradiation therapy and wait minimum 2 months before consideration for surgery

## 2021-03-14 NOTE — Assessment & Plan Note (Signed)
She has diarrhea and dysuria likely secondary to side effects of radiation I recommend the patient to take Imodium as needed

## 2021-03-15 ENCOUNTER — Inpatient Hospital Stay: Payer: Medicaid Other

## 2021-03-15 ENCOUNTER — Ambulatory Visit
Admission: RE | Admit: 2021-03-15 | Discharge: 2021-03-15 | Disposition: A | Discharge status: Home / Self Care | Payer: Medicaid Other | Source: Ambulatory Visit | Attending: Radiation Oncology | Admitting: Radiation Oncology

## 2021-03-15 ENCOUNTER — Telehealth: Payer: Self-pay | Admitting: Gynecologic Oncology

## 2021-03-15 ENCOUNTER — Telehealth: Payer: Self-pay

## 2021-03-15 ENCOUNTER — Other Ambulatory Visit: Payer: Self-pay

## 2021-03-15 VITALS — BP 158/80 | HR 90 | Temp 98.5°F | Resp 20 | Wt 286.5 lb

## 2021-03-15 DIAGNOSIS — C539 Malignant neoplasm of cervix uteri, unspecified: Secondary | ICD-10-CM | POA: Diagnosis not present

## 2021-03-15 MED ORDER — SODIUM CHLORIDE 0.9 % IV SOLN
32.0000 mg/m2 | Freq: Once | INTRAVENOUS | Status: AC
  Administered 2021-03-15: 79 mg via INTRAVENOUS
  Filled 2021-03-15: qty 79

## 2021-03-15 MED ORDER — SODIUM CHLORIDE 0.9 % IV SOLN
10.0000 mg | Freq: Once | INTRAVENOUS | Status: AC
  Administered 2021-03-15: 10 mg via INTRAVENOUS
  Filled 2021-03-15: qty 10

## 2021-03-15 MED ORDER — PALONOSETRON HCL INJECTION 0.25 MG/5ML
0.2500 mg | Freq: Once | INTRAVENOUS | Status: AC
  Administered 2021-03-15: 0.25 mg via INTRAVENOUS
  Filled 2021-03-15: qty 5

## 2021-03-15 MED ORDER — MAGNESIUM SULFATE 2 GM/50ML IV SOLN
2.0000 g | Freq: Once | INTRAVENOUS | Status: AC
  Administered 2021-03-15: 2 g via INTRAVENOUS
  Filled 2021-03-15: qty 50

## 2021-03-15 MED ORDER — HEPARIN SOD (PORK) LOCK FLUSH 100 UNIT/ML IV SOLN
500.0000 [IU] | Freq: Once | INTRAVENOUS | Status: AC | PRN
  Administered 2021-03-15: 500 [IU]

## 2021-03-15 MED ORDER — SODIUM CHLORIDE 0.9 % IV SOLN
150.0000 mg | Freq: Once | INTRAVENOUS | Status: AC
  Administered 2021-03-15: 150 mg via INTRAVENOUS
  Filled 2021-03-15: qty 150

## 2021-03-15 MED ORDER — SODIUM CHLORIDE 0.9 % IV SOLN: Freq: Once | INTRAVENOUS | Status: AC

## 2021-03-15 MED ORDER — SODIUM CHLORIDE 0.9% FLUSH
10.0000 mL | INTRAVENOUS | Status: DC | PRN
  Administered 2021-03-15: 10 mL

## 2021-03-15 MED ORDER — POTASSIUM CHLORIDE IN NACL 20-0.9 MEQ/L-% IV SOLN
Freq: Once | INTRAVENOUS | Status: AC
  Filled 2021-03-15: qty 1000

## 2021-03-15 NOTE — Telephone Encounter (Signed)
Spoke with Alyssa Becker in person while she was receiving treatment today. Informed patient that her mom is requesting an appointment with Dr. Berline Lopes. RN inquired if this is something the patient would like. "Yeah, its ok for Dr. Berline Lopes to talk to my mom. She just has some questions she would like answered."  Discussed scheduling a phone call  with the patient and Dr. Berline Lopes and having her mom present. Patient states " I don't need to be apart of it. It's just for my mom. She goes back to [place] on Monday though. We don't get good cell service at our house so if we need to make calls we have to go into town."  Advised patient that Dr. Berline Lopes has a full clinic schedule today and the message will be given to her.

## 2021-03-15 NOTE — Telephone Encounter (Signed)
After my office confirm with the patient that she would like me to call her mother, I reached out to her mother to discuss her care.  We reviewed her diagnosis, the difficulty with regard to determining whether the cancer originated in her cervix or her ovary and how we came to the conclusion to treat her based on suspicion that it was endocervical in origin.  I also discussed why we made the decision not to take out the other ovary at the time of surgery (given normal appearance on imaging, young age, and not knowing whether the enlarged ovary was malignant -and if it was, whether it was the same malignancy is have been biopsied from her cervix).  The patient and I have already discussed in the postoperative period the option in the future of going back in for removal of the other ovary.  I stressed the importance of not interrupting treatment as this has implications for the effectiveness of our treatment.  We also discussed plan to get imaging after we finish treatment, to assess disease status and help [place] decide if Alyssa Becker needs additional cancer treatment now.  All questions answered.  Jeral Pinch MD Gynecologic Oncology

## 2021-03-15 NOTE — Patient Instructions (Signed)
[facility]  Discharge Instructions: °Thank you for choosing [facility] to provide your oncology and hematology care.  ° °If you have a lab appointment with the Cancer Center, please go directly to the Cancer Center and check in at the registration area. °  °Wear comfortable clothing and clothing appropriate for easy access to any Portacath or PICC line.  ° °We strive to give you quality time with your provider. You may need to reschedule your appointment if you arrive late (15 or more minutes).  Arriving late affects you and other patients whose appointments are after yours.  Also, if you miss three or more appointments without notifying the office, you may be dismissed from the clinic at the provider’s discretion.    °  °For prescription refill requests, have your pharmacy contact our office and allow 72 hours for refills to be completed.   ° °Today you received the following chemotherapy and/or immunotherapy agents Cisplatin    °  °To help prevent nausea and vomiting after your treatment, we encourage you to take your nausea medication as directed. ° °BELOW ARE SYMPTOMS THAT SHOULD BE REPORTED IMMEDIATELY: °*FEVER GREATER THAN 100.4 F (38 °C) OR HIGHER °*CHILLS OR SWEATING °*NAUSEA AND VOMITING THAT IS NOT CONTROLLED WITH YOUR NAUSEA MEDICATION °*UNUSUAL SHORTNESS OF BREATH °*UNUSUAL BRUISING OR BLEEDING °*URINARY PROBLEMS (pain or burning when urinating, or frequent urination) °*BOWEL PROBLEMS (unusual diarrhea, constipation, pain near the anus) °TENDERNESS IN MOUTH AND THROAT WITH OR WITHOUT PRESENCE OF ULCERS (sore throat, sores in mouth, or a toothache) °UNUSUAL RASH, SWELLING OR PAIN  °UNUSUAL VAGINAL DISCHARGE OR ITCHING  ° °Items with * indicate a potential emergency and should be followed up as soon as possible or go to the Emergency Department if any problems should occur. ° °Please show the CHEMOTHERAPY ALERT CARD or IMMUNOTHERAPY ALERT CARD at check-in to  the Emergency Department and triage nurse. ° °Should you have questions after your visit or need to cancel or reschedule your appointment, please contact [facility]  Dept: [phone]  and follow the prompts.  Office hours are 8:00 a.m. to 4:30 p.m. Monday - Friday. Please note that voicemails left after 4:00 p.m. may not be returned until the following business day.  We are closed weekends and major holidays. You have access to a nurse at all times for urgent questions. Please call the main number to the clinic Dept: [phone] and follow the prompts. ° ° °For any non-urgent questions, you may also contact your provider using MyChart. We now offer e-Visits for anyone 18 and older to request care online for non-urgent symptoms. For details visit [email or web address]. °  °Also download the MyChart app! Go to the app store, search "MyChart", open the app, select [facility], and log in with your MyChart username and password. ° °Due to Covid, a mask is required upon entering the hospital/clinic. If you do not have a mask, one will be given to you upon arrival. For doctor visits, patients may have 1 support person aged 18 or older with them. For treatment visits, patients cannot have anyone with them due to current Covid guidelines and our immunocompromised population.  ° °Cisplatin injection °What is this medication? °CISPLATIN (SIS pla tin) is a chemotherapy drug. It targets fast dividing cells, like cancer cells, and causes these cells to die. This medicine is used to treat many types of cancer like bladder, ovarian, and testicular cancers. °This medicine may be used   for other purposes; ask your health care provider or pharmacist if you have questions. °COMMON BRAND NAME(S): Platinol, Platinol -AQ °What should I tell my care team before I take this medication? °They need to know if you have any of these conditions: °eye disease, vision problems °hearing problems °kidney  disease °low blood counts, like white cells, platelets, or red blood cells °tingling of the fingers or toes, or other nerve disorder °an unusual or allergic reaction to cisplatin, carboplatin, oxaliplatin, other medicines, foods, dyes, or preservatives °pregnant or trying to get pregnant °breast-feeding °How should I use this medication? °This drug is given as an infusion into a vein. It is administered in a hospital or clinic by a specially trained health care professional. °Talk to your pediatrician regarding the use of this medicine in children. Special care may be needed. °Overdosage: If you think you have taken too much of this medicine contact a poison control center or emergency room at once. °NOTE: This medicine is only for you. Do not share this medicine with others. °What if I miss a dose? °It is important not to miss a dose. Call your doctor or health care professional if you are unable to keep an appointment. °What may interact with this medication? °This medicine may interact with the following medications: °foscarnet °certain antibiotics like amikacin, gentamicin, neomycin, polymyxin B, streptomycin, tobramycin, vancomycin °This list may not describe all possible interactions. Give your health care provider a list of all the medicines, herbs, non-prescription drugs, or dietary supplements you use. Also tell them if you smoke, drink alcohol, or use illegal drugs. Some items may interact with your medicine. °What should I watch for while using this medication? °Your condition will be monitored carefully while you are receiving this medicine. You will need important blood work done while you are taking this medicine. °This drug may make you feel generally unwell. This is not uncommon, as chemotherapy can affect healthy cells as well as cancer cells. Report any side effects. Continue your course of treatment even though you feel ill unless your doctor tells you to stop. °This medicine may increase your  risk of getting an infection. Call your healthcare professional for advice if you get a fever, chills, or sore throat, or other symptoms of a cold or flu. Do not treat yourself. Try to avoid being around people who are sick. °Avoid taking medicines that contain aspirin, acetaminophen, ibuprofen, naproxen, or ketoprofen unless instructed by your healthcare professional. These medicines may hide a fever. °This medicine may increase your risk to bruise or bleed. Call your doctor or health care professional if you notice any unusual bleeding. °Be careful brushing and flossing your teeth or using a toothpick because you may get an infection or bleed more easily. If you have any dental work done, tell your dentist you are receiving this medicine. °Do not become pregnant while taking this medicine or for 14 months after stopping it. Women should inform their healthcare professional if they wish to become pregnant or think they might be pregnant. Men should not father a child while taking this medicine and for 11 months after stopping it. There is potential for serious side effects to an unborn child. Talk to your healthcare professional for more information. °Do not breast-feed an infant while taking this medicine. °This medicine has caused ovarian failure in some women. This medicine may make it more difficult to get pregnant. Talk to your healthcare professional if you are concerned about your fertility. °This medicine has caused   decreased sperm counts in some men. This may make it more difficult to father a child. Talk to your healthcare professional if you are concerned about your fertility. °Drink fluids as directed while you are taking this medicine. This will help protect your kidneys. °Call your doctor or health care professional if you get diarrhea. Do not treat yourself. °What side effects may I notice from receiving this medication? °Side effects that you should report to your doctor or health care professional  as soon as possible: °allergic reactions like skin rash, itching or hives, swelling of the face, lips, or tongue °blurred vision °changes in vision °decreased hearing or ringing of the ears °nausea, vomiting °pain, redness, or irritation at site where injected °pain, tingling, numbness in the hands or feet °signs and symptoms of bleeding such as bloody or black, tarry stools; red or dark brown urine; spitting up blood or brown material that looks like coffee grounds; red spots on the skin; unusual bruising or bleeding from the eyes, gums, or nose °signs and symptoms of infection like fever; chills; cough; sore throat; pain or trouble passing urine °signs and symptoms of kidney injury like trouble passing urine or change in the amount of urine °signs and symptoms of low red blood cells or anemia such as unusually weak or tired; feeling faint or lightheaded; falls; breathing problems °Side effects that usually do not require medical attention (report to your doctor or health care professional if they continue or are bothersome): °loss of appetite °mouth sores °muscle cramps °This list may not describe all possible side effects. Call your doctor for medical advice about side effects. You may report side effects to FDA at 1-800-FDA-1088. °Where should I keep my medication? °This drug is given in a hospital or clinic and will not be stored at home. °NOTE: This sheet is a summary. It may not cover all possible information. If you have questions about this medicine, talk to your doctor, pharmacist, or health care provider. °© 2022 [employer]vier/Gold Standard (2020-09-25 00:00:00) ° ° °

## 2021-03-18 ENCOUNTER — Ambulatory Visit
Admission: RE | Admit: 2021-03-18 | Discharge: 2021-03-18 | Disposition: A | Discharge status: Home / Self Care | Payer: Medicaid Other | Source: Ambulatory Visit | Attending: Radiation Oncology | Admitting: Radiation Oncology

## 2021-03-18 ENCOUNTER — Other Ambulatory Visit: Payer: Self-pay

## 2021-03-18 DIAGNOSIS — C539 Malignant neoplasm of cervix uteri, unspecified: Secondary | ICD-10-CM | POA: Diagnosis not present

## 2021-03-19 ENCOUNTER — Ambulatory Visit
Admission: RE | Admit: 2021-03-19 | Discharge: 2021-03-19 | Disposition: A | Discharge status: Home / Self Care | Payer: Medicaid Other | Source: Ambulatory Visit | Attending: Radiation Oncology | Admitting: Radiation Oncology

## 2021-03-19 ENCOUNTER — Telehealth: Payer: Self-pay | Admitting: Radiology

## 2021-03-19 DIAGNOSIS — C539 Malignant neoplasm of cervix uteri, unspecified: Secondary | ICD-10-CM | POA: Diagnosis not present

## 2021-03-19 NOTE — Telephone Encounter (Signed)
Per Dr Sondra Come, advised patient she may take AZO for dysuria. Advised patient to call back if needed. Patient verbalized understanding.

## 2021-03-20 ENCOUNTER — Other Ambulatory Visit: Payer: Self-pay

## 2021-03-20 ENCOUNTER — Inpatient Hospital Stay: Payer: Medicaid Other | Attending: Gynecologic Oncology

## 2021-03-20 ENCOUNTER — Ambulatory Visit
Admission: RE | Admit: 2021-03-20 | Discharge: 2021-03-20 | Disposition: A | Discharge status: Home / Self Care | Payer: Medicaid Other | Source: Ambulatory Visit | Attending: Radiation Oncology | Admitting: Radiation Oncology

## 2021-03-20 DIAGNOSIS — C541 Malignant neoplasm of endometrium: Secondary | ICD-10-CM | POA: Diagnosis not present

## 2021-03-20 DIAGNOSIS — C7961 Secondary malignant neoplasm of right ovary: Secondary | ICD-10-CM | POA: Diagnosis not present

## 2021-03-20 DIAGNOSIS — D500 Iron deficiency anemia secondary to blood loss (chronic): Secondary | ICD-10-CM | POA: Diagnosis not present

## 2021-03-20 DIAGNOSIS — C539 Malignant neoplasm of cervix uteri, unspecified: Secondary | ICD-10-CM | POA: Insufficient documentation

## 2021-03-20 LAB — CBC WITH DIFFERENTIAL (CANCER CENTER ONLY)
Abs Immature Granulocytes: 0.02 10*3/uL (ref 0.00–0.07)
Basophils Absolute: 0 10*3/uL (ref 0.0–0.1)
Basophils Relative: 1 %
Eosinophils Absolute: 0.2 10*3/uL (ref 0.0–0.5)
Eosinophils Relative: 3 %
HCT: 36.1 % (ref 36.0–46.0)
Hemoglobin: 11.4 g/dL — ABNORMAL LOW (ref 12.0–15.0)
Immature Granulocytes: 0 %
Lymphocytes Relative: 14 %
Lymphs Abs: 0.9 10*3/uL (ref 0.7–4.0)
MCH: 22.9 pg — ABNORMAL LOW (ref 26.0–34.0)
MCHC: 31.6 g/dL (ref 30.0–36.0)
MCV: 72.5 fL — ABNORMAL LOW (ref 80.0–100.0)
Monocytes Absolute: 0.4 10*3/uL (ref 0.1–1.0)
Monocytes Relative: 5 %
Neutro Abs: 5 10*3/uL (ref 1.7–7.7)
Neutrophils Relative %: 77 %
Platelet Count: 219 10*3/uL (ref 150–400)
RBC: 4.98 MIL/uL (ref 3.87–5.11)
RDW: 33 % — ABNORMAL HIGH (ref 11.5–15.5)
WBC Count: 6.5 10*3/uL (ref 4.0–10.5)
nRBC: 0 % (ref 0.0–0.2)

## 2021-03-20 LAB — MAGNESIUM: Magnesium: 1.7 mg/dL (ref 1.7–2.4)

## 2021-03-20 LAB — BASIC METABOLIC PANEL - CANCER CENTER ONLY
Anion gap: 5 (ref 5–15)
BUN: 14 mg/dL (ref 6–20)
CO2: 30 mmol/L (ref 22–32)
Calcium: 9.4 mg/dL (ref 8.9–10.3)
Chloride: 103 mmol/L (ref 98–111)
Creatinine: 0.57 mg/dL (ref 0.44–1.00)
GFR, Estimated: >60 mL/min (ref >60)
Glucose, Bld: 162 mg/dL — ABNORMAL HIGH (ref 70–99)
Potassium: 3.9 mmol/L (ref 3.5–5.1)
Sodium: 138 mmol/L (ref 135–145)

## 2021-03-21 ENCOUNTER — Inpatient Hospital Stay (HOSPITAL_BASED_OUTPATIENT_CLINIC_OR_DEPARTMENT_OTHER): Payer: Medicaid Other | Admitting: Hematology and Oncology

## 2021-03-21 ENCOUNTER — Ambulatory Visit
Admission: RE | Admit: 2021-03-21 | Discharge: 2021-03-21 | Disposition: A | Discharge status: Home / Self Care | Payer: Medicaid Other | Source: Ambulatory Visit | Attending: Radiation Oncology | Admitting: Radiation Oncology

## 2021-03-21 ENCOUNTER — Encounter: Payer: Self-pay | Admitting: Hematology and Oncology

## 2021-03-21 DIAGNOSIS — D500 Iron deficiency anemia secondary to blood loss (chronic): Secondary | ICD-10-CM | POA: Diagnosis not present

## 2021-03-21 DIAGNOSIS — C539 Malignant neoplasm of cervix uteri, unspecified: Secondary | ICD-10-CM | POA: Diagnosis not present

## 2021-03-21 MED FILL — Fosaprepitant Dimeglumine For IV Infusion 150 MG (Base Eq): INTRAVENOUS | Qty: 5 | Status: AC

## 2021-03-21 MED FILL — Dexamethasone Sodium Phosphate Inj 100 MG/10ML: INTRAMUSCULAR | Qty: 1 | Status: AC

## 2021-03-21 NOTE — Assessment & Plan Note (Signed)
Her anemia is improving ?She has received IV iron treatment ?She has no further bleeding ?We will observe closely ?

## 2021-03-21 NOTE — Progress Notes (Signed)
[facility] PROGRESS NOTE  Patient Care Team: Pcp, No as PCP - General [name] Craige Cotta, RN as Oncology Nurse Navigator (Oncology)  ASSESSMENT & PLAN:  Malignant neoplasm of cervix [facility]) She complained of excessive fatigue but otherwise denies nausea, changes in bowel habits or peripheral neuropathy from treatment Her anemia is improving after IV iron We will complete chemotherapy tomorrow I will see her again next week for further follow-up  Iron deficiency anemia due to chronic blood loss Her anemia is improving She has received IV iron treatment She has no further bleeding We will observe closely  No orders of the defined types were placed in this encounter.   All questions were answered. The patient knows to call the clinic with any problems, questions or concerns. The total time spent in the appointment was 20 minutes encounter with patients including review of chart and various tests results, discussions about plan of care and coordination of care plan   [name], MD 03/21/2021 9:43 AM  INTERVAL HISTORY: Please see below for problem oriented charting. she returns for treatment follow-up seen prior to cycle 5 of cisplatin Since last time I saw her, she is doing well Her appetite is fair She denies recent nausea, diarrhea, or peripheral neuropathy No further vaginal bleeding  REVIEW OF SYSTEMS:   Constitutional: Denies fevers, chills or abnormal weight loss Eyes: Denies blurriness of vision Ears, nose, mouth, throat, and face: Denies mucositis or sore throat Respiratory: Denies cough, dyspnea or wheezes Cardiovascular: Denies palpitation, chest discomfort or lower extremity swelling Gastrointestinal:  Denies nausea, heartburn or change in bowel habits Skin: Denies abnormal skin rashes Lymphatics: Denies new lymphadenopathy or easy bruising Neurological:Denies numbness, tingling or new weaknesses Behavioral/Psych: Mood is stable, no new changes   All other systems were reviewed with the patient and are negative.  I have reviewed the past medical history, past surgical history, social history and family history with the patient and they are unchanged from previous note.  ALLERGIES:  is allergic to pineapple and prednisone.  MEDICATIONS:  Current Outpatient Medications  Medication Sig Dispense Refill   albuterol (VENTOLIN HFA) 108 (90 Base) MCG/ACT inhaler Inhale 1-2 puffs into the lungs every 6 (six) hours as needed for wheezing or shortness of breath.     Ascorbic Acid (VITAMIN C PO) Take 1 tablet by mouth once a week.     lidocaine-prilocaine (EMLA) cream Apply to affected area once (Patient not taking: Reported on 02/18/2021) 30 g 3   metFORMIN (GLUCOPHAGE) 500 MG tablet Take 1 tablet (500 mg total) by mouth 2 (two) times daily with a meal. (Patient taking differently: Take 500 mg by mouth at bedtime.) 60 tablet 1   ondansetron (ZOFRAN) 8 MG tablet Take 1 tablet (8 mg total) by mouth every 8 (eight) hours as needed. (Patient not taking: Reported on 02/18/2021) 30 tablet 1   prochlorperazine (COMPAZINE) 10 MG tablet Take 1 tablet (10 mg total) by mouth every 6 (six) hours as needed (Nausea or vomiting). (Patient not taking: Reported on 02/18/2021) 30 tablet 1   propranolol (INDERAL) 40 MG tablet Take 40 mg by mouth daily as needed (migraines).     No current facility-administered medications for this visit.    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 Large  indeterminate right adnexal mass with solid hypervascular and cystic components. This appears separate from the base of the appendix on preceding CT and is likely arising from the right ovary based on relationship to the gonadal vessels on CT. Given the patient's young  age, hypervascularity and leukocytosis, findings could represent an atypical tubo-ovarian abscess. However, the appearance is concerning for ovarian malignancy despite the patient's young age. Recommend prompt gynecology consultation. Pelvic MRI without and with contrast may be helpful for further evaluation. 2. The left ovary appears normal. Mild nonspecific thickening of the endometrium, within physiologic limits.   01/02/2021 Pathology Results   SPECIMEN ADEQUACY: Satisfactory for evaluation; transformation zone component PRESENT. INTERPRETATION: - Adenocarcinoma, NOS   01/15/2021 Imaging   MRI pelvis  13.0 cm cystic and solid mass arising from the right ovary, highly suspicious for ovarian carcinoma.   Mild endometrial thickening and 4.5 cm soft tissue mass within the endocervical canal. Differential diagnosis includes prolapsing endometrial polyp or carcinoma, or primary cervical carcinoma.   No evidence of pelvic metastatic disease.   01/16/2021 Initial Diagnosis   Malignant neoplasm of cervix ([place])   01/22/2021 PET scan   1. Large cystic/necrotic right adnexal mass is markedly hypermetabolic and consistent with known neoplasm. 2. Extensive hypermetabolic tumor involving the endometrium and cervix. 3. Scattered borderline enlarged and mildly hypermetabolic retroperitoneal and external iliac nodes. 4. No findings for metastatic disease involving the chest or bony structures.   01/24/2021 Surgery   Pre-operative Diagnosis: Complex adnexal mass, elevated CA-125, locally invasive cervical adenocarcinoma versus endometrial adenocarcinoma   Post-operative Diagnosis: same, high-grade carcinoma of the right ovary, suspected history of superinfection of the adnexal mass   Operation: Robotic-assisted laparoscopic bilateral salpingectomy, right oophorectomy, lysis of adhesions of approximately 45 minutes, right ureterolysis   Surgeon: Jeral Pinch MD    Operative Findings: On EUA,  cervix enlarged and firm, fleshy tumor replacing the endocervix.  No definitive parametrial involvement appreciated.  Even gentle exam because significant bleeding so vagina was packed with lap sponges for the remainder of surgery.  On intra-abdominal exam, normal upper abdominal survey including omentum, liver edge, diaphragm, and stomach.  Normal small bowel.  Some physiologic adhesions of the sigmoid epiploica to the left sidewall.  Normal-appearing left ovary measuring approximately 3-4 cm.  Normal-appearing bilateral fallopian tubes.  Uterus approximately 8-10 cm and overall normal in appearance.  Right ovary replaced by a smooth but vascular 10 cm mass with multiple loculations.  Ovary with dense adhesions to the right pelvic sidewall.  Upon very careful manipulation of the mass for its removal, there was some drainage of fluid noted that was somewhat purulent.  This was sent for culture and Gram stain.  Upon contained mass removal with fractionation of the mass, tumor versus necrotic material was noted.  Frozen section consistent with high-grade carcinoma, unable to differentiate on frozen section whether this is the same cancer being seen on her cervical biopsies.  Right ureterolysis performed given oozing services from the broad ligament and peritoneum where adhesions were lysed between the ovary and peritoneum, to assure ureter not in close proximity.  No obvious adenopathy.  No ascites.  Left ovary transposed out of the pelvis along the left gutter, clips placed along the sidewall as well as the superior and inferior aspect of the ovary.     01/24/2021 Pathology Results   FINAL MICROSCOPIC DIAGNOSIS:   A. OVARY AND FALLOPIAN TUBE, RIGHT, SALPINGO OOPHORECTOMY:  - Endometrioid carcinoma, moderately differentiated, involving right ovary  - No evidence  of ovarian surface involvement by carcinoma  - Segment of fallopian tube, negative for carcinoma  - See oncology table  - See comment   B.  FALLOPIAN TUBE, LEFT, SALPINGECTOMY:  - Segment of fallopian tube, negative for carcinoma   A.  Immunostain for p16 shows patchy staining.  Immunostain for p53 shows a normal, wild-type expression pattern.  This immunoprofile is consistent with above interpretation.  Patient's previous cervical  biopsy was reviewed - it shows moderately differentiated carcinoma with a very similar histomorphology and very likely presents metastasis from the ovarian tumor to the cervix.    02/05/2021 Cancer Staging   Staging form: Cervix Uteri, AJCC Version 9 - Pathologic stage from 02/05/2021: FIGO Stage II (pT2, pN0, cM0) - Signed by [name], MD on 02/05/2021 Stage prefix: Initial diagnosis    02/14/2021 Procedure   Successful placement of a right internal jugular approach power injectable Port-A-Cath. The catheter is ready for immediate use   02/22/2021 -  Chemotherapy   Patient is on Treatment Plan : Cervical cancer Cisplatin q7d       PHYSICAL EXAMINATION: ECOG PERFORMANCE STATUS: 1 - Symptomatic but completely ambulatory  GENERAL:alert, no distress and comfortable NEURO: alert & oriented x 3 with fluent speech, no focal motor/sensory deficits  LABORATORY DATA:  I have reviewed the data as listed    Component Value Date/Time   NA 138 03/20/2021 0926   K 3.9 03/20/2021 0926   CL 103 03/20/2021 0926   CO2 30 03/20/2021 0926   GLUCOSE 162 (H) 03/20/2021 0926   BUN 14 03/20/2021 0926   CREATININE 0.57 03/20/2021 0926   CALCIUM 9.4 03/20/2021 0926   PROT 8.1 01/18/2021 0855   ALBUMIN 4.3 01/18/2021 0855   AST 12 (L) 01/18/2021 0855   ALT 11 01/18/2021 0855   ALKPHOS 92 01/18/2021 0855   BILITOT 0.3 01/18/2021 0855   GFRNONAA >60 03/20/2021 0926   GFRAA >60 05/18/2019 1957    No results found for: SPEP, UPEP  Lab Results  Component Value Date   WBC 6.5 03/20/2021   NEUTROABS 5.0 03/20/2021   HGB 11.4 (L) 03/20/2021   HCT 36.1 03/20/2021   MCV 72.5 (L) 03/20/2021   PLT 219  03/20/2021      Chemistry      Component Value Date/Time   NA 138 03/20/2021 0926   K 3.9 03/20/2021 0926   CL 103 03/20/2021 0926   CO2 30 03/20/2021 0926   BUN 14 03/20/2021 0926   CREATININE 0.57 03/20/2021 0926      Component Value Date/Time   CALCIUM 9.4 03/20/2021 0926   ALKPHOS 92 01/18/2021 0855   AST 12 (L) 01/18/2021 0855   ALT 11 01/18/2021 0855   BILITOT 0.3 01/18/2021 0855

## 2021-03-21 NOTE — Assessment & Plan Note (Signed)
She complained of excessive fatigue but otherwise denies nausea, changes in bowel habits or peripheral neuropathy from treatment ?Her anemia is improving after IV iron ?We will complete chemotherapy tomorrow ?I will see her again next week for further follow-up ?

## 2021-03-22 ENCOUNTER — Other Ambulatory Visit: Payer: Self-pay

## 2021-03-22 ENCOUNTER — Ambulatory Visit
Admission: RE | Admit: 2021-03-22 | Discharge: 2021-03-22 | Disposition: A | Discharge status: Home / Self Care | Payer: Medicaid Other | Source: Ambulatory Visit | Attending: Radiation Oncology | Admitting: Radiation Oncology

## 2021-03-22 ENCOUNTER — Inpatient Hospital Stay: Payer: Medicaid Other

## 2021-03-22 VITALS — BP 154/101 | HR 97 | Temp 98.3°F | Resp 20 | Wt 285.5 lb

## 2021-03-22 DIAGNOSIS — C539 Malignant neoplasm of cervix uteri, unspecified: Secondary | ICD-10-CM

## 2021-03-22 MED ORDER — MAGNESIUM SULFATE 2 GM/50ML IV SOLN
2.0000 g | Freq: Once | INTRAVENOUS | Status: AC
  Administered 2021-03-22: 2 g via INTRAVENOUS
  Filled 2021-03-22: qty 50

## 2021-03-22 MED ORDER — SODIUM CHLORIDE 0.9% FLUSH
10.0000 mL | INTRAVENOUS | Status: DC | PRN
  Administered 2021-03-22: 10 mL

## 2021-03-22 MED ORDER — PALONOSETRON HCL INJECTION 0.25 MG/5ML
0.2500 mg | Freq: Once | INTRAVENOUS | Status: AC
  Administered 2021-03-22: 0.25 mg via INTRAVENOUS
  Filled 2021-03-22: qty 5

## 2021-03-22 MED ORDER — SODIUM CHLORIDE 0.9 % IV SOLN
10.0000 mg | Freq: Once | INTRAVENOUS | Status: AC
  Administered 2021-03-22: 10 mg via INTRAVENOUS
  Filled 2021-03-22: qty 10

## 2021-03-22 MED ORDER — SODIUM CHLORIDE 0.9 % IV SOLN: Freq: Once | INTRAVENOUS | Status: AC

## 2021-03-22 MED ORDER — SODIUM CHLORIDE 0.9 % IV SOLN
150.0000 mg | Freq: Once | INTRAVENOUS | Status: AC
  Administered 2021-03-22: 150 mg via INTRAVENOUS
  Filled 2021-03-22: qty 150

## 2021-03-22 MED ORDER — POTASSIUM CHLORIDE IN NACL 20-0.9 MEQ/L-% IV SOLN
Freq: Once | INTRAVENOUS | Status: AC
  Filled 2021-03-22: qty 1000

## 2021-03-22 MED ORDER — HEPARIN SOD (PORK) LOCK FLUSH 100 UNIT/ML IV SOLN
500.0000 [IU] | Freq: Once | INTRAVENOUS | Status: AC | PRN
  Administered 2021-03-22: 500 [IU]

## 2021-03-22 MED ORDER — SODIUM CHLORIDE 0.9 % IV SOLN
32.0000 mg/m2 | Freq: Once | INTRAVENOUS | Status: AC
  Administered 2021-03-22: 78 mg via INTRAVENOUS
  Filled 2021-03-22: qty 78

## 2021-03-22 NOTE — Patient Instructions (Signed)
[facility]  Discharge Instructions: Thank you for choosing [facility] to provide your oncology and hematology care.   If you have a lab appointment with the Cancer Center, please go directly to the Cancer Center and check in at the registration area.   Wear comfortable clothing and clothing appropriate for easy access to any Portacath or PICC line.   We strive to give you quality time with your provider. You may need to reschedule your appointment if you arrive late (15 or more minutes).  Arriving late affects you and other patients whose appointments are after yours.  Also, if you miss three or more appointments without notifying the office, you may be dismissed from the clinic at the provider's discretion.      For prescription refill requests, have your pharmacy contact our office and allow 72 hours for refills to be completed.    Today you received the following chemotherapy and/or immunotherapy agents : Cisplatin    To help prevent nausea and vomiting after your treatment, we encourage you to take your nausea medication as directed.  BELOW ARE SYMPTOMS THAT SHOULD BE REPORTED IMMEDIATELY: *FEVER GREATER THAN 100.4 F (38 C) OR HIGHER *CHILLS OR SWEATING *NAUSEA AND VOMITING THAT IS NOT CONTROLLED WITH YOUR NAUSEA MEDICATION *UNUSUAL SHORTNESS OF BREATH *UNUSUAL BRUISING OR BLEEDING *URINARY PROBLEMS (pain or burning when urinating, or frequent urination) *BOWEL PROBLEMS (unusual diarrhea, constipation, pain near the anus) TENDERNESS IN MOUTH AND THROAT WITH OR WITHOUT PRESENCE OF ULCERS (sore throat, sores in mouth, or a toothache) UNUSUAL RASH, SWELLING OR PAIN  UNUSUAL VAGINAL DISCHARGE OR ITCHING   Items with * indicate a potential emergency and should be followed up as soon as possible or go to the Emergency Department if any problems should occur.  Please show the CHEMOTHERAPY ALERT CARD or IMMUNOTHERAPY ALERT CARD at check-in to  the Emergency Department and triage nurse.  Should you have questions after your visit or need to cancel or reschedule your appointment, please contact [facility]  Dept: [phone]  and follow the prompts.  Office hours are 8:00 a.m. to 4:30 p.m. Monday - Friday. Please note that voicemails left after 4:00 p.m. may not be returned until the following business day.  We are closed weekends and major holidays. You have access to a nurse at all times for urgent questions. Please call the main number to the clinic Dept: [phone] and follow the prompts.   For any non-urgent questions, you may also contact your provider using MyChart. We now offer e-Visits for anyone 18 and older to request care online for non-urgent symptoms. For details visit [email or web address].   Also download the MyChart app! Go to the app store, search "MyChart", open the app, select [facility], and log in with your MyChart username and password.  Due to Covid, a mask is required upon entering the hospital/clinic. If you do not have a mask, one will be given to you upon arrival. For doctor visits, patients may have 1 support person aged 18 or older with them. For treatment visits, patients cannot have anyone with them due to current Covid guidelines and our immunocompromised population.   

## 2021-03-25 ENCOUNTER — Ambulatory Visit
Admission: RE | Admit: 2021-03-25 | Discharge: 2021-03-25 | Disposition: A | Discharge status: Home / Self Care | Payer: Medicaid Other | Source: Ambulatory Visit | Attending: Radiation Oncology | Admitting: Radiation Oncology

## 2021-03-25 ENCOUNTER — Other Ambulatory Visit: Payer: Self-pay

## 2021-03-25 DIAGNOSIS — C539 Malignant neoplasm of cervix uteri, unspecified: Secondary | ICD-10-CM | POA: Diagnosis not present

## 2021-03-26 ENCOUNTER — Ambulatory Visit
Admission: RE | Admit: 2021-03-26 | Discharge: 2021-03-26 | Disposition: A | Discharge status: Home / Self Care | Payer: Medicaid Other | Source: Ambulatory Visit | Attending: Radiation Oncology | Admitting: Radiation Oncology

## 2021-03-26 DIAGNOSIS — C539 Malignant neoplasm of cervix uteri, unspecified: Secondary | ICD-10-CM | POA: Diagnosis not present

## 2021-03-27 ENCOUNTER — Ambulatory Visit
Admission: RE | Admit: 2021-03-27 | Discharge: 2021-03-27 | Disposition: A | Discharge status: Home / Self Care | Payer: Medicaid Other | Source: Ambulatory Visit | Attending: Radiation Oncology | Admitting: Radiation Oncology

## 2021-03-27 ENCOUNTER — Inpatient Hospital Stay: Payer: Medicaid Other

## 2021-03-27 ENCOUNTER — Other Ambulatory Visit: Payer: Self-pay | Admitting: Radiation Oncology

## 2021-03-27 ENCOUNTER — Other Ambulatory Visit: Payer: Self-pay

## 2021-03-27 ENCOUNTER — Other Ambulatory Visit (HOSPITAL_COMMUNITY): Payer: Self-pay | Admitting: Radiation Oncology

## 2021-03-27 DIAGNOSIS — C539 Malignant neoplasm of cervix uteri, unspecified: Secondary | ICD-10-CM

## 2021-03-27 LAB — BASIC METABOLIC PANEL - CANCER CENTER ONLY
Anion gap: 5 (ref 5–15)
BUN: 14 mg/dL (ref 6–20)
CO2: 32 mmol/L (ref 22–32)
Calcium: 9.6 mg/dL (ref 8.9–10.3)
Chloride: 101 mmol/L (ref 98–111)
Creatinine: 0.6 mg/dL (ref 0.44–1.00)
GFR, Estimated: >60 mL/min (ref >60)
Glucose, Bld: 111 mg/dL — ABNORMAL HIGH (ref 70–99)
Potassium: 4.7 mmol/L (ref 3.5–5.1)
Sodium: 138 mmol/L (ref 135–145)

## 2021-03-27 LAB — CBC WITH DIFFERENTIAL (CANCER CENTER ONLY)
Abs Immature Granulocytes: 0.02 10*3/uL (ref 0.00–0.07)
Basophils Absolute: 0 10*3/uL (ref 0.0–0.1)
Basophils Relative: 1 %
Eosinophils Absolute: 0.2 10*3/uL (ref 0.0–0.5)
Eosinophils Relative: 3 %
HCT: 37.8 % (ref 36.0–46.0)
Hemoglobin: 11.8 g/dL — ABNORMAL LOW (ref 12.0–15.0)
Immature Granulocytes: 0 %
Lymphocytes Relative: 12 %
Lymphs Abs: 0.8 10*3/uL (ref 0.7–4.0)
MCH: 23.1 pg — ABNORMAL LOW (ref 26.0–34.0)
MCHC: 31.2 g/dL (ref 30.0–36.0)
MCV: 74 fL — ABNORMAL LOW (ref 80.0–100.0)
Monocytes Absolute: 0.5 10*3/uL (ref 0.1–1.0)
Monocytes Relative: 8 %
Neutro Abs: 5 10*3/uL (ref 1.7–7.7)
Neutrophils Relative %: 76 %
Platelet Count: 210 10*3/uL (ref 150–400)
RBC: 5.11 MIL/uL (ref 3.87–5.11)
WBC Count: 6.6 10*3/uL (ref 4.0–10.5)
nRBC: 0 % (ref 0.0–0.2)

## 2021-03-27 LAB — MAGNESIUM: Magnesium: 1.9 mg/dL (ref 1.7–2.4)

## 2021-03-28 ENCOUNTER — Ambulatory Visit
Admission: RE | Admit: 2021-03-28 | Discharge: 2021-03-28 | Disposition: A | Discharge status: Home / Self Care | Payer: Medicaid Other | Source: Ambulatory Visit | Attending: Radiation Oncology | Admitting: Radiation Oncology

## 2021-03-28 ENCOUNTER — Inpatient Hospital Stay (HOSPITAL_BASED_OUTPATIENT_CLINIC_OR_DEPARTMENT_OTHER): Payer: Medicaid Other | Admitting: Hematology and Oncology

## 2021-03-28 ENCOUNTER — Encounter: Payer: Self-pay | Admitting: Hematology and Oncology

## 2021-03-28 ENCOUNTER — Telehealth: Payer: Self-pay | Admitting: Oncology

## 2021-03-28 ENCOUNTER — Other Ambulatory Visit: Payer: Self-pay | Admitting: Hematology and Oncology

## 2021-03-28 DIAGNOSIS — C539 Malignant neoplasm of cervix uteri, unspecified: Secondary | ICD-10-CM

## 2021-03-28 DIAGNOSIS — D500 Iron deficiency anemia secondary to blood loss (chronic): Secondary | ICD-10-CM

## 2021-03-28 NOTE — Assessment & Plan Note (Signed)
Her anemia is improving ?She has received IV iron treatment ?She has no further bleeding ?We will observe closely ?

## 2021-03-28 NOTE — Progress Notes (Signed)
[place] OFFICE PROGRESS NOTE  Patient Care Team: Pcp, No as PCP - General [name] Craige Cotta, RN as Oncology Nurse Navigator (Oncology)  ASSESSMENT & PLAN:  Malignant neoplasm of cervix [facility]) She has no residual side effects from treatment Her hemoglobin continues to improve, likely due to recent IV iron infusion and cessation of vaginal bleeding Per discussion with radiation oncologist and GYN oncologist, the plan will be to complete radiation therapy and within a reasonable timeframe, for her to proceed with hysterectomy She does not need to return next week for follow-up given lack of symptoms and improvement of her blood count I will touch base with the team with plan to see her back within 2 weeks after hysterectomy to determine the next plan of care She is in agreement   Iron deficiency anemia due to chronic blood loss Her anemia is improving She has received IV iron treatment She has no further bleeding We will observe closely  No orders of the defined types were placed in this encounter.   All questions were answered. The patient knows to call the clinic with any problems, questions or concerns. The total time spent in the appointment was 20 minutes encounter with patients including review of chart and various tests results, discussions about plan of care and coordination of care plan   [name], MD 03/28/2021 9:53 AM  INTERVAL HISTORY: Please see below for problem oriented charting. she returns for treatment follow-up after completion of chemotherapy She is doing very well She have no side effects from treatment whatsoever She has no recent vaginal bleeding  REVIEW OF SYSTEMS:   Constitutional: Denies fevers, chills or abnormal weight loss Eyes: Denies blurriness of vision Ears, nose, mouth, throat, and face: Denies mucositis or sore throat Respiratory: Denies cough, dyspnea or wheezes Cardiovascular: Denies palpitation, chest discomfort or lower  extremity swelling Gastrointestinal:  Denies nausea, heartburn or change in bowel habits Skin: Denies abnormal skin rashes Lymphatics: Denies new lymphadenopathy or easy bruising Neurological:Denies numbness, tingling or new weaknesses Behavioral/Psych: Mood is stable, no new changes  All other systems were reviewed with the patient and are negative.  I have reviewed the past medical history, past surgical history, social history and family history with the patient and they are unchanged from previous note.  ALLERGIES:  is allergic to pineapple and prednisone.  MEDICATIONS:  Current Outpatient Medications  Medication Sig Dispense Refill   albuterol (VENTOLIN HFA) 108 (90 Base) MCG/ACT inhaler Inhale 1-2 puffs into the lungs every 6 (six) hours as needed for wheezing or shortness of breath.     Ascorbic Acid (VITAMIN C PO) Take 1 tablet by mouth once a week.     lidocaine-prilocaine (EMLA) cream Apply to affected area once (Patient not taking: Reported on 02/18/2021) 30 g 3   metFORMIN (GLUCOPHAGE) 500 MG tablet Take 1 tablet (500 mg total) by mouth 2 (two) times daily with a meal. (Patient taking differently: Take 500 mg by mouth at bedtime.) 60 tablet 1   propranolol (INDERAL) 40 MG tablet Take 40 mg by mouth daily as needed (migraines).     No current facility-administered medications for this visit.    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 Large indeterminate right adnexal mass with solid hypervascular and cystic components. This  appears separate from the base of the appendix on preceding CT and is likely arising from the right ovary based on relationship to the gonadal vessels on CT. Given the patient's young age, hypervascularity and leukocytosis, findings could  represent an atypical tubo-ovarian abscess. However, the appearance is concerning for ovarian malignancy despite the patient's young age. Recommend prompt gynecology consultation. Pelvic MRI without and with contrast may be helpful for further evaluation. 2. The left ovary appears normal. Mild nonspecific thickening of the endometrium, within physiologic limits.   01/02/2021 Pathology Results   SPECIMEN ADEQUACY: Satisfactory for evaluation; transformation zone component PRESENT. INTERPRETATION: - Adenocarcinoma, NOS   01/15/2021 Imaging   MRI pelvis  13.0 cm cystic and solid mass arising from the right ovary, highly suspicious for ovarian carcinoma.   Mild endometrial thickening and 4.5 cm soft tissue mass within the endocervical canal. Differential diagnosis includes prolapsing endometrial polyp or carcinoma, or primary cervical carcinoma.   No evidence of pelvic metastatic disease.   01/16/2021 Initial Diagnosis   Malignant neoplasm of cervix ([place])   01/22/2021 PET scan   1. Large cystic/necrotic right adnexal mass is markedly hypermetabolic and consistent with known neoplasm. 2. Extensive hypermetabolic tumor involving the endometrium and cervix. 3. Scattered borderline enlarged and mildly hypermetabolic retroperitoneal and external iliac nodes. 4. No findings for metastatic disease involving the chest or bony structures.   01/24/2021 Surgery   Pre-operative Diagnosis: Complex adnexal mass, elevated CA-125, locally invasive cervical adenocarcinoma versus endometrial adenocarcinoma   Post-operative Diagnosis: same, high-grade carcinoma of the right ovary, suspected history of superinfection of the adnexal mass   Operation: Robotic-assisted laparoscopic bilateral salpingectomy, right oophorectomy, lysis of adhesions of approximately 45 minutes, right ureterolysis   Surgeon: Jeral Pinch MD    Operative Findings: On EUA, cervix enlarged and firm, fleshy tumor replacing the  endocervix.  No definitive parametrial involvement appreciated.  Even gentle exam because significant bleeding so vagina was packed with lap sponges for the remainder of surgery.  On intra-abdominal exam, normal upper abdominal survey including omentum, liver edge, diaphragm, and stomach.  Normal small bowel.  Some physiologic adhesions of the sigmoid epiploica to the left sidewall.  Normal-appearing left ovary measuring approximately 3-4 cm.  Normal-appearing bilateral fallopian tubes.  Uterus approximately 8-10 cm and overall normal in appearance.  Right ovary replaced by a smooth but vascular 10 cm mass with multiple loculations.  Ovary with dense adhesions to the right pelvic sidewall.  Upon very careful manipulation of the mass for its removal, there was some drainage of fluid noted that was somewhat purulent.  This was sent for culture and Gram stain.  Upon contained mass removal with fractionation of the mass, tumor versus necrotic material was noted.  Frozen section consistent with high-grade carcinoma, unable to differentiate on frozen section whether this is the same cancer being seen on her cervical biopsies.  Right ureterolysis performed given oozing services from the broad ligament and peritoneum where adhesions were lysed between the ovary and peritoneum, to assure ureter not in close proximity.  No obvious adenopathy.  No ascites.  Left ovary transposed out of the pelvis along the left gutter, clips placed along the sidewall as well as the superior and inferior aspect of the ovary.     01/24/2021 Pathology Results   FINAL MICROSCOPIC DIAGNOSIS:   A. OVARY AND FALLOPIAN TUBE, RIGHT, SALPINGO OOPHORECTOMY:  - Endometrioid carcinoma, moderately differentiated, involving right ovary  - No evidence of ovarian surface involvement by carcinoma  - Segment of fallopian  tube, negative for carcinoma  - See oncology table  - See comment   B. FALLOPIAN TUBE, LEFT, SALPINGECTOMY:  - Segment of fallopian  tube, negative for carcinoma   A.  Immunostain for p16 shows patchy staining.  Immunostain for p53 shows a normal, wild-type expression pattern.  This immunoprofile is consistent with above interpretation.  Patient's previous cervical  biopsy was reviewed - it shows moderately differentiated carcinoma with a very similar histomorphology and very likely presents metastasis from the ovarian tumor to the cervix.    02/05/2021 Cancer Staging   Staging form: Cervix Uteri, AJCC Version 9 - Pathologic stage from 02/05/2021: FIGO Stage II (pT2, pN0, cM0) - Signed by [name], MD on 02/05/2021 Stage prefix: Initial diagnosis    02/14/2021 Procedure   Successful placement of a right internal jugular approach power injectable Port-A-Cath. The catheter is ready for immediate use   02/22/2021 - 03/22/2021 Chemotherapy   Patient is on Treatment Plan : Cervical cancer Cisplatin q7d       PHYSICAL EXAMINATION: ECOG PERFORMANCE STATUS: 0 - Asymptomatic  Vitals:   03/28/21 0936  BP: 135/83  Pulse: 93  Resp: 17  Temp: 98.6 F (37 C)  SpO2: 100%   Filed Weights   03/28/21 0936  Weight: 284 lb 6.4 oz (129 kg)    GENERAL:alert, no distress and comfortable NEURO: alert & oriented x 3 with fluent speech, no focal motor/sensory deficits  LABORATORY DATA:  I have reviewed the data as listed    Component Value Date/Time   NA 138 03/27/2021 0936   K 4.7 03/27/2021 0936   CL 101 03/27/2021 0936   CO2 32 03/27/2021 0936   GLUCOSE 111 (H) 03/27/2021 0936   BUN 14 03/27/2021 0936   CREATININE 0.60 03/27/2021 0936   CALCIUM 9.6 03/27/2021 0936   PROT 8.1 01/18/2021 0855   ALBUMIN 4.3 01/18/2021 0855   AST 12 (L) 01/18/2021 0855   ALT 11 01/18/2021 0855   ALKPHOS 92 01/18/2021 0855   BILITOT 0.3 01/18/2021 0855   GFRNONAA >60 03/27/2021 0936   GFRAA >60 05/18/2019 1957    No results found for: SPEP, UPEP  Lab Results  Component Value Date   WBC 6.6 03/27/2021   NEUTROABS 5.0 03/27/2021    HGB 11.8 (L) 03/27/2021   HCT 37.8 03/27/2021   MCV 74.0 (L) 03/27/2021   PLT 210 03/27/2021      Chemistry      Component Value Date/Time   NA 138 03/27/2021 0936   K 4.7 03/27/2021 0936   CL 101 03/27/2021 0936   CO2 32 03/27/2021 0936   BUN 14 03/27/2021 0936   CREATININE 0.60 03/27/2021 0936      Component Value Date/Time   CALCIUM 9.6 03/27/2021 0936   ALKPHOS 92 01/18/2021 0855   AST 12 (L) 01/18/2021 0855   ALT 11 01/18/2021 0855   BILITOT 0.3 01/18/2021 0855

## 2021-03-28 NOTE — Telephone Encounter (Signed)
Called [place] and discussed upcoming appointments for Tandem and Ring procedure on 04/09/21, CT scan on 05/14/22 and appointment with Dr. Berline Lopes to follow.  Advised that I will give her an updated calendar and contrast for the CT tomorrow at her last radiation appointment.  She verbalized understanding and agreement. ?

## 2021-03-28 NOTE — Assessment & Plan Note (Signed)
She has no residual side effects from treatment ?Her hemoglobin continues to improve, likely due to recent IV iron infusion and cessation of vaginal bleeding ?Per discussion with radiation oncologist and GYN oncologist, the plan will be to complete radiation therapy and within a reasonable timeframe, for her to proceed with hysterectomy ?She does not need to return next week for follow-up given lack of symptoms and improvement of her blood count ?I will touch base with the team with plan to see her back within 2 weeks after hysterectomy to determine the next plan of care ?She is in agreement  ?

## 2021-03-29 ENCOUNTER — Encounter: Payer: Self-pay | Admitting: Radiation Oncology

## 2021-03-29 ENCOUNTER — Ambulatory Visit
Admission: RE | Admit: 2021-03-29 | Discharge: 2021-03-29 | Disposition: A | Discharge status: Home / Self Care | Payer: Medicaid Other | Source: Ambulatory Visit | Attending: Radiation Oncology | Admitting: Radiation Oncology

## 2021-03-29 ENCOUNTER — Ambulatory Visit: Payer: Medicaid Other

## 2021-03-29 ENCOUNTER — Encounter: Payer: Self-pay | Admitting: Hematology and Oncology

## 2021-03-29 DIAGNOSIS — C539 Malignant neoplasm of cervix uteri, unspecified: Secondary | ICD-10-CM | POA: Diagnosis not present

## 2021-04-01 ENCOUNTER — Ambulatory Visit: Payer: Medicaid Other | Admitting: Radiation Oncology

## 2021-04-02 ENCOUNTER — Ambulatory Visit: Payer: Medicaid Other

## 2021-04-03 ENCOUNTER — Ambulatory Visit: Payer: Medicaid Other

## 2021-04-03 ENCOUNTER — Encounter (HOSPITAL_BASED_OUTPATIENT_CLINIC_OR_DEPARTMENT_OTHER): Payer: Self-pay | Admitting: Radiation Oncology

## 2021-04-03 ENCOUNTER — Other Ambulatory Visit: Payer: Medicaid Other

## 2021-04-03 ENCOUNTER — Other Ambulatory Visit: Payer: Self-pay

## 2021-04-03 NOTE — Progress Notes (Addendum)
Spoke w/ via phone for pre-op interview--- pt ?Lab needs dos----  urine preg (per anes)/  pre-op orders pending             ?Lab results------ pt has current lab work results in epic dated 03-27-2021 CBCdiff/ BMP and current ekg ?COVID test -----patient states asymptomatic no test needed ?Arrive at ------- 0530 on 04-09-2021 ?NPO after MN NO Solid Food.  Clear liquids from MN until--- 0430 ?Med rec completed ?Medications to take morning of surgery ----- if needed take propranolol ?Diabetic medication ----- n/a ?Patient instructed no nail polish to be worn day of surgery ?Patient instructed to bring photo id and insurance card day of surgery ?Patient aware to have Driver (ride ) / caregiver for 24 hours after surgery --cousin, alexandra brewer ?Patient Special Instructions ----- asked to bring rescue inhaler dos.  Since this is pt first tandem procedure  reviewed process  ?Pre-Op special Istructions ----- sent inbox message to dr Sondra Come in epic, requested orders ?Patient verbalized understanding of instructions that were given at this phone interview. ?Patient denies shortness of breath, chest pain, fever, cough at this phone interview.  ?

## 2021-04-04 ENCOUNTER — Ambulatory Visit: Payer: Medicaid Other | Admitting: Hematology and Oncology

## 2021-04-04 ENCOUNTER — Ambulatory Visit: Payer: Medicaid Other

## 2021-04-05 ENCOUNTER — Ambulatory Visit: Payer: Medicaid Other

## 2021-04-08 NOTE — Anesthesia Preprocedure Evaluation (Addendum)
Anesthesia Evaluation  ?Patient identified by MRN, date of birth, ID band ?Patient awake ? ? ? ?Reviewed: ?Allergy & Precautions, NPO status , Patient's Chart, lab work & pertinent test results ? ?Airway ?Mallampati: III ? ?TM Distance: >3 FB ?Neck ROM: Full ? ? ? Dental ? ?(+) Dental Advisory Given ?  ?Pulmonary ?asthma ,  ?  ?Pulmonary exam normal ?breath sounds clear to auscultation ? ? ? ? ? ? Cardiovascular ?negative cardio ROS ?Normal cardiovascular exam ?Rhythm:Regular Rate:Normal ? ? ?  ?Neuro/Psych ? Headaches, PSYCHIATRIC DISORDERS Anxiety Depression   ? GI/Hepatic ?negative GI ROS, Neg liver ROS,   ?Endo/Other  ?diabetes, Well Controlled, Type 2, Oral Hypoglycemic AgentsMorbid obesitya1c 5.3 ?BMI 46 ? Renal/GU ?negative Renal ROS  ?negative genitourinary ?  ?Musculoskeletal ?negative musculoskeletal ROS ?(+)  ? Abdominal ?(+) + obese,   ?Peds ? Hematology ?negative hematology ROS ?(+) 03/27/21 Hb 11.8   ?Anesthesia Other Findings ? ? Reproductive/Obstetrics ?negative OB ROS ?Cervical ca s/p chemo, radiation, surgery  ? ?  ? ? ? ? ? ? ? ? ? ? ? ? ? ?  ?  ? ? ? ? ? ? ?Anesthesia Physical ?Anesthesia Plan ? ?ASA: 3 ? ?Anesthesia Plan: General  ? ?Post-op Pain Management: Tylenol PO (pre-op)* and Toradol IV (intra-op)*  ? ?Induction: Intravenous ? ?PONV Risk Score and Plan: 3 and Ondansetron, Dexamethasone, Midazolam, Treatment may vary due to age or medical condition and Scopolamine patch - Pre-op ? ?Airway Management Planned: LMA ? ?Additional Equipment: None ? ?Intra-op Plan:  ? ?Post-operative Plan: Extubation in OR ? ?Informed Consent: I have reviewed the patients History and Physical, chart, labs and discussed the procedure including the risks, benefits and alternatives for the proposed anesthesia with the patient or authorized representative who has indicated his/her understanding and acceptance.  ? ? ? ?Dental advisory given ? ?Plan Discussed with: CRNA ? ?Anesthesia  Plan Comments: (Right chest wall port in place)  ? ? ? ?Anesthesia Quick Evaluation ? ?

## 2021-04-08 NOTE — Progress Notes (Signed)
?  Radiation Oncology         [phone]) [phone] ?________________________________ ? ?Name: Alyssa Becker MRN: [id number]  ?Date: 04/09/2021  DOB: 07/28/1995 ? ?CC: [facility] Dept Personal  [name], MD ? ?HDR BRACHYTHERAPY NOTE ? ?DIAGNOSIS: Stage IIIC-2 adenocarcinoma of the cervix ? ?NARRATIVE: ?The patient was brought to the [facility] suite. Identity was confirmed. All relevant records and images related to the planned course of therapy were reviewed. The patient freely provided informed written consent to proceed with treatment after reviewing the details related to the planned course of therapy. The consent form was witnessed and verified by the simulation staff. Then, the patient was set-up in a stable reproducible supine position for radiation therapy. The tandem ring system was accessed and fiducial markers were placed within the tandem and ring.  ? ?Simple treatment device note: ?On the operating room table,  the patient had construction of her custom tandem ring system. She will be treated with a 45? tandem/ring system. The patient had placement of a 60 mm tandem. A cervical ring with a small shielding was used for her treatment. A rectal paddle was also part of her custom set up device. ? ?Verification simulation note: ?An AP and lateral film was obtained through the pelvis area. This was compared to the patient's planning films documenting accurate position of the tandem/ring system for treatment. ? ?High-dose-rate brachytherapy treatment note:   ?The remote afterloading device was accessed through catheter system and attached to the tandem ring system. Patient then proceeded to undergo her first high-dose-rate treatment directed at the cervix. The patient was prescribed a dose of 5.5 gray to be delivered to the [place].Marland Kitchen Patient was treated with 2 channels using 26 dwell positions. Treatment time was 753.1 seconds. The patient tolerated the procedure well. After completion of her  therapy, a radiation survey was performed documenting return of the iridium source into the [employer] safe. The patient was then transferred to the nursing suite.  She then had removal of the rectal paddle followed by the tandem and ring system. The patient tolerated the removal well. ? ?PLAN: She has not completed her radiation therapy.  While in the operating room Dr. Berline Lopes examined the patient and felt she would be a candidate for hysterectomy.  This will occur approximately a month from now. ?________________________________   ?----------------------------------- ? ?Blair Promise, PhD, MD ? ?This document serves as a record of services personally performed by Gery Pray, MD. It was created on his behalf by Roney Mans, a trained medical scribe. The creation of this record is based on the scribe's personal observations and the provider's statements to them. This document has been checked and approved by the attending provider. ? ? ?

## 2021-04-09 ENCOUNTER — Encounter: Payer: Self-pay | Admitting: Radiation Oncology

## 2021-04-09 ENCOUNTER — Ambulatory Visit
Admission: RE | Admit: 2021-04-09 | Discharge: 2021-04-09 | Disposition: A | Discharge status: Home / Self Care | Payer: Medicaid Other | Source: Ambulatory Visit | Attending: Radiation Oncology | Admitting: Radiation Oncology

## 2021-04-09 ENCOUNTER — Ambulatory Visit (HOSPITAL_COMMUNITY)
Admission: RE | Admit: 2021-04-09 | Discharge: 2021-04-09 | Disposition: A | Discharge status: Home / Self Care | Payer: Medicaid Other | Source: Ambulatory Visit | Attending: Radiation Oncology | Admitting: Radiation Oncology

## 2021-04-09 ENCOUNTER — Other Ambulatory Visit: Payer: Self-pay

## 2021-04-09 ENCOUNTER — Encounter (HOSPITAL_BASED_OUTPATIENT_CLINIC_OR_DEPARTMENT_OTHER): Payer: Self-pay | Admitting: Radiation Oncology

## 2021-04-09 ENCOUNTER — Ambulatory Visit (HOSPITAL_BASED_OUTPATIENT_CLINIC_OR_DEPARTMENT_OTHER): Payer: Medicaid Other | Admitting: Anesthesiology

## 2021-04-09 ENCOUNTER — Ambulatory Visit (HOSPITAL_BASED_OUTPATIENT_CLINIC_OR_DEPARTMENT_OTHER)
Admission: RE | Admit: 2021-04-09 | Discharge: 2021-04-09 | Disposition: A | Discharge status: Home / Self Care | Payer: Medicaid Other | Attending: Radiation Oncology | Admitting: Radiation Oncology

## 2021-04-09 ENCOUNTER — Encounter (HOSPITAL_BASED_OUTPATIENT_CLINIC_OR_DEPARTMENT_OTHER)
Admission: RE | Disposition: A | Discharge status: Home / Self Care | Payer: Self-pay | Source: Home / Self Care | Attending: Radiation Oncology

## 2021-04-09 VITALS — BP 126/62 | HR 83 | Resp 20

## 2021-04-09 DIAGNOSIS — Z6841 Body Mass Index (BMI) 40.0 and over, adult: Secondary | ICD-10-CM | POA: Diagnosis not present

## 2021-04-09 DIAGNOSIS — C539 Malignant neoplasm of cervix uteri, unspecified: Secondary | ICD-10-CM | POA: Insufficient documentation

## 2021-04-09 DIAGNOSIS — F419 Anxiety disorder, unspecified: Secondary | ICD-10-CM | POA: Insufficient documentation

## 2021-04-09 DIAGNOSIS — J45909 Unspecified asthma, uncomplicated: Secondary | ICD-10-CM | POA: Insufficient documentation

## 2021-04-09 DIAGNOSIS — F418 Other specified anxiety disorders: Secondary | ICD-10-CM | POA: Diagnosis not present

## 2021-04-09 DIAGNOSIS — E119 Type 2 diabetes mellitus without complications: Secondary | ICD-10-CM

## 2021-04-09 DIAGNOSIS — Z7984 Long term (current) use of oral hypoglycemic drugs: Secondary | ICD-10-CM | POA: Insufficient documentation

## 2021-04-09 DIAGNOSIS — F32A Depression, unspecified: Secondary | ICD-10-CM | POA: Diagnosis not present

## 2021-04-09 HISTORY — DX: Personal history of irradiation: Z92.3

## 2021-04-09 HISTORY — DX: Iron deficiency anemia secondary to blood loss (chronic): D50.0

## 2021-04-09 HISTORY — PX: TANDEM RING INSERTION: SHX6199

## 2021-04-09 HISTORY — DX: Type 2 diabetes mellitus without complications: E11.9

## 2021-04-09 HISTORY — DX: Personal history of antineoplastic chemotherapy: Z92.21

## 2021-04-09 HISTORY — PX: OPERATIVE ULTRASOUND: SHX5996

## 2021-04-09 HISTORY — DX: Migraine, unspecified, not intractable, without status migrainosus: G43.909

## 2021-04-09 HISTORY — DX: Malignant neoplasm of cervix uteri, unspecified: C53.9

## 2021-04-09 LAB — GLUCOSE, CAPILLARY
Glucose-Capillary: 120 mg/dL — ABNORMAL HIGH (ref 70–99)
Glucose-Capillary: 125 mg/dL — ABNORMAL HIGH (ref 70–99)

## 2021-04-09 SURGERY — INSERTION, UTERINE TANDEM AND RING OR CYLINDER, FOR BRACHYTHERAPY
Anesthesia: General | Site: Vagina

## 2021-04-09 MED ORDER — MIDAZOLAM HCL 5 MG/5ML IJ SOLN
INTRAMUSCULAR | Status: DC | PRN
  Administered 2021-04-09: 2 mg via INTRAVENOUS

## 2021-04-09 MED ORDER — HYDROMORPHONE HCL 1 MG/ML IJ SOLN
0.2500 mg | INTRAMUSCULAR | Status: DC | PRN
  Administered 2021-04-09: 0.25 mg via INTRAVENOUS
  Administered 2021-04-09: 0.5 mg via INTRAVENOUS

## 2021-04-09 MED ORDER — GLYCOPYRROLATE 0.2 MG/ML IJ SOLN
INTRAMUSCULAR | Status: DC | PRN
  Administered 2021-04-09: .1 mg via INTRAVENOUS

## 2021-04-09 MED ORDER — MIDAZOLAM HCL 2 MG/2ML IJ SOLN
INTRAMUSCULAR | Status: AC
  Filled 2021-04-09: qty 2

## 2021-04-09 MED ORDER — HYDROMORPHONE HCL 1 MG/ML IJ SOLN
0.5000 mg | Freq: Once | INTRAMUSCULAR | Status: AC
  Administered 2021-04-09: 0.5 mg via INTRAVENOUS
  Filled 2021-04-09: qty 1

## 2021-04-09 MED ORDER — FENTANYL CITRATE (PF) 100 MCG/2ML IJ SOLN
INTRAMUSCULAR | Status: DC | PRN
Start: 2021-04-09 — End: 2021-04-09
  Administered 2021-04-09: 100 ug via INTRAVENOUS

## 2021-04-09 MED ORDER — PROPOFOL 1000 MG/100ML IV EMUL
INTRAVENOUS | Status: AC
  Filled 2021-04-09: qty 100

## 2021-04-09 MED ORDER — SODIUM CHLORIDE 0.9% FLUSH
10.0000 mL | Freq: Once | INTRAVENOUS | Status: AC
  Administered 2021-04-09: 10 mL via INTRAVENOUS

## 2021-04-09 MED ORDER — AMISULPRIDE (ANTIEMETIC) 5 MG/2ML IV SOLN: 10.0000 mg | Freq: Once | INTRAVENOUS | Status: DC | PRN

## 2021-04-09 MED ORDER — ACETAMINOPHEN 500 MG PO TABS
ORAL_TABLET | ORAL | Status: AC
  Filled 2021-04-09: qty 2

## 2021-04-09 MED ORDER — LACTATED RINGERS IV SOLN
INTRAVENOUS | Status: DC
  Filled 2021-04-09 (×2): qty 250

## 2021-04-09 MED ORDER — ONDANSETRON HCL 4 MG/2ML IJ SOLN
INTRAMUSCULAR | Status: DC | PRN
  Administered 2021-04-09: 4 mg via INTRAVENOUS

## 2021-04-09 MED ORDER — SCOPOLAMINE 1 MG/3DAYS TD PT72
MEDICATED_PATCH | TRANSDERMAL | Status: AC
  Filled 2021-04-09: qty 1

## 2021-04-09 MED ORDER — ONDANSETRON HCL 4 MG/2ML IJ SOLN: 4.0000 mg | Freq: Once | INTRAMUSCULAR | Status: DC | PRN

## 2021-04-09 MED ORDER — KETOROLAC TROMETHAMINE 30 MG/ML IJ SOLN
INTRAMUSCULAR | Status: DC | PRN
  Administered 2021-04-09: 30 mg via INTRAVENOUS

## 2021-04-09 MED ORDER — OXYCODONE HCL 5 MG PO TABS: 5.0000 mg | ORAL_TABLET | Freq: Once | ORAL | Status: DC | PRN

## 2021-04-09 MED ORDER — HEPARIN SOD (PORK) LOCK FLUSH 100 UNIT/ML IV SOLN
500.0000 [IU] | Freq: Once | INTRAVENOUS | Status: AC
  Administered 2021-04-09: 500 [IU] via INTRAVENOUS

## 2021-04-09 MED ORDER — LACTATED RINGERS IV SOLN: INTRAVENOUS | Status: DC

## 2021-04-09 MED ORDER — ACETAMINOPHEN 500 MG PO TABS
1000.0000 mg | ORAL_TABLET | Freq: Once | ORAL | Status: AC
  Administered 2021-04-09: 1000 mg via ORAL

## 2021-04-09 MED ORDER — SODIUM CHLORIDE 0.9 % IR SOLN
Status: DC | PRN
  Administered 2021-04-09: 225 mL via INTRAVESICAL

## 2021-04-09 MED ORDER — FENTANYL CITRATE (PF) 100 MCG/2ML IJ SOLN
INTRAMUSCULAR | Status: AC
  Filled 2021-04-09: qty 2

## 2021-04-09 MED ORDER — PROPOFOL 10 MG/ML IV BOLUS
INTRAVENOUS | Status: DC | PRN
  Administered 2021-04-09 (×4): 20 mg via INTRAVENOUS
  Administered 2021-04-09: 100 mg via INTRAVENOUS
  Administered 2021-04-09: 200 mg via INTRAVENOUS

## 2021-04-09 MED ORDER — SCOPOLAMINE 1 MG/3DAYS TD PT72
1.0000 | MEDICATED_PATCH | TRANSDERMAL | Status: DC
  Administered 2021-04-09: 1.5 mg via TRANSDERMAL

## 2021-04-09 MED ORDER — HYDROMORPHONE HCL 1 MG/ML IJ SOLN
INTRAMUSCULAR | Status: AC
  Filled 2021-04-09: qty 1

## 2021-04-09 MED ORDER — OXYCODONE HCL 5 MG/5ML PO SOLN: 5.0000 mg | Freq: Once | ORAL | Status: DC | PRN

## 2021-04-09 MED ORDER — ESTRADIOL 0.1 MG/GM VA CREA
TOPICAL_CREAM | VAGINAL | Status: AC
  Filled 2021-04-09: qty 42.5

## 2021-04-09 SURGICAL SUPPLY — 20 items
BNDG CONFORM 2 STRL LF (GAUZE/BANDAGES/DRESSINGS) IMPLANT
DILATOR CANAL MILEX (MISCELLANEOUS) IMPLANT
DRSG PAD ABDOMINAL 8X10 ST (GAUZE/BANDAGES/DRESSINGS) ×2 IMPLANT
DRSG TELFA 3X8 NADH (GAUZE/BANDAGES/DRESSINGS) IMPLANT
GAUZE 4X4 16PLY ~~LOC~~+RFID DBL (SPONGE) ×5 IMPLANT
GLOVE SURG ENC MOIS LTX SZ7.5 (GLOVE) ×6 IMPLANT
GOWN STRL REUS W/TWL LRG LVL3 (GOWN DISPOSABLE) ×3 IMPLANT
HOLDER FOLEY CATH W/STRAP (MISCELLANEOUS) ×2 IMPLANT
IV NS 1000ML (IV SOLUTION) ×3
IV NS 1000ML BAXH (IV SOLUTION) ×2 IMPLANT
IV SET EXTENSION GRAVITY 40 LF (IV SETS) ×2 IMPLANT
KIT TURNOVER CYSTO (KITS) ×3 IMPLANT
MAT PREVALON FULL STRYKER (MISCELLANEOUS) ×3 IMPLANT
PACK VAGINAL MINOR WOMEN LF (CUSTOM PROCEDURE TRAY) ×3 IMPLANT
PACKING VAGINAL (PACKING) IMPLANT
PAD DRESSING TELFA 3X8 NADH (GAUZE/BANDAGES/DRESSINGS) ×2 IMPLANT
SET CYSTO W/LG BORE CLAMP LF (SET/KITS/TRAYS/PACK) ×1 IMPLANT
TOWEL OR 17X26 10 PK STRL BLUE (TOWEL DISPOSABLE) ×3 IMPLANT
TRAY FOLEY W/BAG SLVR 14FR LF (SET/KITS/TRAYS/PACK) ×3 IMPLANT
WATER STERILE IRR 500ML POUR (IV SOLUTION) ×2 IMPLANT

## 2021-04-09 NOTE — Anesthesia Procedure Notes (Signed)
Procedure Name: LMA Insertion ?Date/Time: 04/09/2021 7:26 AM ?Performed by: Georgeanne Nim, CRNA ?Pre-anesthesia Checklist: Patient identified, Emergency Drugs available, Suction available, Patient being monitored and Timeout performed ?Patient Re-evaluated:Patient Re-evaluated prior to induction ?Oxygen Delivery Method: Circle system utilized ?Preoxygenation: Pre-oxygenation with 100% oxygen ?Induction Type: IV induction ?Ventilation: Mask ventilation without difficulty ?LMA: LMA inserted ?LMA Size: 4.0 ?Number of attempts: 1 ?Placement Confirmation: positive ETCO2, CO2 detector and breath sounds checked- equal and bilateral ?Tube secured with: Tape ?Dental Injury: Teeth and Oropharynx as per pre-operative assessment  ? ? ? ? ?

## 2021-04-09 NOTE — Progress Notes (Addendum)
Spoke with the patient about plan for EUA today at start of T&O treatment. I have spoken with Drs. Alvy Bimler and Kinard about my concern that this may be endometrial carcinoma. I suggested we proceed with modified treatment plan for cervical cancer with decreased dose of HDR and plan for interval hysterectomy. All questions answered. ? ?On EUA prior to her T&O today, cervix is considerably smaller than prior to treatment. It is less firm, no nodularity appreciated. No parametrial invasion appreciated. On speculum exam, cervix is 4-5 cm, central 2 cm with some prominent glandular tissue, increased vascularity. No friability noted, no definitive mass. Good response to treatment. ? ?Jeral Pinch MD ?Gynecologic Oncology ? ?

## 2021-04-09 NOTE — H&P (Signed)
?Radiation Oncology         [phone] ?________________________________ ? ?History and physical examination ? ?Name: Alyssa Becker MRN: [id number]  ?Date: 03/27/2021  DOB: [birth year] ? ? 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 ? ? ?HISTORY OF PRESENT ILLNESS::Alyssa Becker is a 26 y.o. female who is accompanied by a cousin. she is seen as a courtesy of Dr. Berline Lopes for an opinion concerning radiation therapy as part of management for her recently diagnosed cervical cancer.  ? ?The patient presented to the ED on 10/01/20 with worsening right abdominal/pelvic pain x2 days. CT of the abdomen and pelvis performed for ED evaluation revealed a large mixed solid and cystic mass in the right ovary measuring up to 10.1 cm, concerning for ovarian cystic neoplasm. Transabdominal and transvaginal pelvic ultrasound that same date revealed a large indeterminate right adnexal mass with solid hypervascular and cystic components. Given the patient's young age, hypervascularity and leukocytosis, findings were noted to possibly represent an atypical tubo-ovarian abscess. However, the appearance still remained concerning for ovarian malignancy despite the patient's young age. (Left ovary noted to appear grossly normal).  The patient was treated for presumed PID.  She received 24 hours of IV antibiotics and then was discharged home to finish an oral course of antibiotics.  ? ?Subsequently, the patient was referred to Dr. Nelda Marseille, [facility], on 01/02/21 for further evaluation. During this visit, the patient reported feeling much better and no longer reported abdominal pain. The patient reported that her recent menstrual cycles last for 2 days with moderate-heavy flow. In contrast, her normal/past menses would last for about 5-7 days, though also reported a long history of intermenstrual spotting.  Pelvic exam performed during this visit revealed a friable, irregular, villous appearing  mass located at the cervical os. Excessive bleeding was noted during exam which made further visualization difficult. ? ?Biopsy of the cervical mass and Pap smear collected by Dr. Nelda Marseille on 01/02/21 revealed adenocarcinoma, favoring an endocervical origin. ? ?MRI of the pelvis on 01/15/21 showed the 13.0 cm cyctic and solid mass arising from the right ovary as highly suspicious for ovarian carcinoma. Mild endometrial thickening was also appreciated, and the 4.5 cm soft tissue mass within the endocervical canal. Differential diagnoses included prolapsing endometrial polyp or carcinoma, or primary cervical carcinoma in regards to this finding. Otherwise, no evidence of pelvic metastatic disease was appreciated.  ? ?Accordingly, the patient was referred to Dr. Berline Lopes on 01/16/21. Pelvic exam performed during this visit again revealed the central portion of the cervix as replaced by a friable mass. On bimanual exam, the cervix was appreciated to measure 5-6 cm, and noted as barrel shaped and firm. On rectovaginal exam, firmness and questionable nodularity were appreciated along the posterior aspect of the cervix, noted as non-definitive for parametrial involvement. The adnexal mass was also appreciated on abdominal hands, but not palpable on rectovaginal exam.  During this visit, the patient reported her appetite to come and go, significant fatigue for the past 5 days, and a recent onset of vaginal discharge. Following evaluation and review of pathology and imaging, Dr. Berline Lopes ultimately recommended proceeding with removal of the abnormal ovary, bilateral fallopian tubes, and pexing of the other ovary to get it out of the pelvis and radiation field.  ? ?PET on 01/22/21 demonstrated the right adnexal mass as markedly hypermetabolic and consistent with known neoplasm, and the extensive hypermetabolic tumor involving the endometrium and cervix. Scattered borderline enlarged and mildly hypermetabolic retroperitoneal and  external iliac nodes were also visualized. No findings for metastatic disease involving the chest or bony ?Structures were otherwise appreciated.  ? ?The patient opted to proceed with right salpingo oophorectomy and left salpingectomy on 01/24/21 under the care of Dr. Berline Lopes. 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 Left salpingectomy showed no evidence of carcinoma. Peritoneal washing also performed revealed no malignant cells present, and reactive mesothelial cells.  ? ?The left ovary was mobilized and secured along the left abdominal sidewall within the paraColic gutter.  Clips were placed around this ovary to help delineate during anticipated radiation therapy and hopefully to avoid this ovary during her radiation therapy. ? ?The patient's case was discussed at the Gynecologic Oncology Multi-Disciplinary Conference on 01/29/20.  The patient's PET scan was reviewed and findings were very suspicious for pelvic and periaortic nodal metastasis.  Following discussion, referrals were placed for consideration of chemotherapy and radiation. The patient met with Dr. Alvy Bimler on 02/06/20 to discuss systemic treatment options, though encounter notes are pending at this time.  ? ?She has completed 45 Gy to the pelvis and is now ready to proceed with brachytherapy. ? ? ? ?PAST MEDICAL HISTORY:  ?Past Medical History:  ?Diagnosis Date  ? Anxiety   ? Asthma   ? Depression   ? History of chemotherapy   ? 02-22-2021  to 03-22-2021  for cervical cancer  ? History of COVID-19 02/2020  ? followed by pcp   (04-03-2021  per pt checks blood sugar once weekly)  ? History of external beam radiation therapy   ? 02-25-2021  to 03-29-2021 for cervical cancer  ? Iron deficiency anemia due to chronic blood loss   ? Malignant neoplasm cervix ([place])   ? oncologist--- dr gorsuch// radiation  onologist-- dr Sondra Come;   dx 01-16-2021;   01-24-2021  s/p robotic RSO and left salpingectomy for right ovarian mass (endometrioid carcinoma);  completed chemo 03-22-2021 and IMRT 03-29-2021 (scheduled for high dose brachytherapy 04-09-2021)  ? Migraines   ? migraine  ? Morbid obesity ([place])   ? Type 2 diabetes mellitus ([place])   ? ? ?PAST SURGICAL HISTORY: ?Past Surgical History:  ?Procedure Laterality Date  ? IR IMAGING GUIDED PORT INSERTION  02/13/2021  ? TONSILLECTOMY    ? child  ? XI ROBOTIC ASSISTED OOPHORECTOMY N/A 01/24/2021  ? Procedure: XI ROBOTIC ASSISTED RIGHT OOPHORECTOMY, OOPHOROPEXY OF LEFT OVARY;  Surgeon: [name], MD;  Location: [facility];  Service: Gynecology;  Laterality: N/A;  ? XI ROBOTIC ASSISTED SALPINGECTOMY Bilateral 01/24/2021  ? Procedure: XI ROBOTIC ASSISTED SALPINGECTOMY; RIGHT URETERAL LYSIS; LYSIS OF ADHESIONS;  Surgeon: [name], MD;  Location: [facility];  Service: Gynecology;  Laterality: Bilateral;  ? ? ?FAMILY HISTORY:  ?Family History  ?Problem Relation Age of Onset  ? Asthma Mother   ? Diabetes Mother   ? Hypertension Mother   ? Breast cancer Mother   ? Asthma Father   ? Diabetes Father   ? Kidney disease Father   ? Diabetes Sister   ? Asthma Sister   ? Asthma Brother   ? Diabetes Brother   ? Cancer Maternal Aunt   ? Diabetes Maternal Uncle   ? Diabetes Maternal Grandmother   ? Hypertension Maternal Grandmother   ? Anemia Maternal Grandmother   ? Heart attack Paternal Grandfather   ? Cancer Paternal Grandfather   ? Colon cancer Neg Hx   ?  Ovarian cancer Neg Hx   ? Endometrial cancer Neg Hx   ? Pancreatic cancer Neg Hx   ? Prostate cancer Neg Hx   ? ? ?SOCIAL HISTORY:  ?Social History  ? ?Tobacco Use  ? Smoking status: Never  ? Smokeless tobacco: Never  ?Vaping Use  ? Vaping Use: Never used  ?Substance Use Topics  ? Alcohol use: Never  ? Drug use: Never  ? ? ?ALLERGIES:  ?Allergies  ?Allergen Reactions  ? Pineapple Anaphylaxis and Swelling  ?  Throat swells  ?  Prednisone Other (See Comments)  ?  Bloating  ? ? ?MEDICATIONS:  ?Current Facility-Administered Medications  ?Medication Dose Route Frequency Provider Last Rate Last Admin  ? lactated ringers infusion   Intravenous Con

## 2021-04-09 NOTE — Op Note (Signed)
04/09/2021 ? ?8:28 AM ? ?PATIENT:  Alyssa Becker  26 y.o. female ? ?PRE-OPERATIVE DIAGNOSIS:  CERVICAL CANCER ? ?POST-OPERATIVE DIAGNOSIS:  CERVICAL CANCER ? ?PROCEDURE:  Procedure(s): ?TANDEM RING INSERTION (N/A) ?OPERATIVE ULTRASOUND (N/A) ? ?SURGEON:  Surgeon(s) and Role: ?   Gery Pray, MD - Primary ?   [name], MD - Assisting ? ?PHYSICIAN ASSISTANT:  ? ?ASSISTANTS: none  ? ?ANESTHESIA:   general ? ?EBL: 10 mL ? ?BLOOD ADMINISTERED:none ? ?DRAINS: Urinary Catheter (Foley)  ? ?LOCAL MEDICATIONS USED:  NONE ? ?SPECIMEN:  No Specimen ? ?DISPOSITION OF SPECIMEN:  N/A ? ?COUNTS:  YES ? ?TOURNIQUET:  * No tourniquets in log * ? ?DICTATION: The patient was taken to [facility] long surgical center room #3.  Patient was prepped and draped in the usual sterile fashion in the dorsolithotomy position.  Timeout for the procedure was performed including preoperative medications,  estimated length of the procedure and allergies.  Prior to prepping the patient Dr. Berline Lopes performed an exam under anesthesia.  The patient was noted to have a good response to her external beam and radiosensitizing chemotherapy.  Disease was felt to be residual along the anterior surface of the cervical region.  On my examination there is no extension of disease into the parametrial area based on rectovaginal examination and bimanual examination. A Foley catheter placed which was backfilled with approximately 200 cc of saline for ultrasound imaging purposes. sounding of the uterus under ultrasound guidance was performed.  The uterus was anteverted and sounded to approximately 9 cm.  Reasonable ultrasound images were obtained.  The cervix was estimated to be 4.2 x 3.5 cm on ultrasound measurements.  She then underwent serial dilation of the cervical os.  A  60 mm cervical sleeve was placed in the endocervical canal and a 45 degree 60 mm tandem was placed within the cervical sleeve.  She then had placement of a 45 degree cervical  ring with a small shielding cap in place.  Attached to this equipment was a rectal paddle posteriorly. She tolerated the procedure well and was subsequently transferred to to the recovery room in stable condition.  Later in the day the patient will undergo planning and her first and only high-dose-rate treatment directed at the lower uterus and cervical region.  She will then proceed with a hysterectomy under the direction of Dr. Berline Lopes approximately a month from now. ? ?PLAN OF CARE:  Transfer to radiation oncology for planning and treatment ? ?PATIENT DISPOSITION:  PACU - hemodynamically stable. ?  ?Delay start of Pharmacological VTE agent (>24hrs) due to surgical blood loss or risk of bleeding: not applicable ? ?

## 2021-04-09 NOTE — Patient Instructions (Signed)
IMMEDIATELY FOLLOWING SURGERY: Do not drive or operate machinery for the first twenty four hours after surgery. Do not make any important decisions for twenty four hours after surgery or while taking narcotic pain medications or sedatives. If you develop intractable nausea and vomiting or a severe headache please notify your doctor immediately.  ? ?FOLLOW-UP: You do not need to follow up with anesthesia unless specifically instructed to do so.  ? ?WOUND CARE INSTRUCTIONS (if applicable): Expect some mild vaginal bleeding, but if large amount of bleeding occurs please contact Dr. Sondra Come at [phone] or the Radiation On-Call physician. Call for any fever greater than 101.0 degrees or increasing vaginal//abdominal pain or trouble urinating.  ? ?QUESTIONS?: Please feel free to call your physician or the hospital operator if you have any questions, and they will be happy to assist you. Resume all medications: as listed on your after visit summary. Your next appointment is: ? ?Future Appointments  ?Date Time Provider [place]  ?04/09/2021  2:00 PM Gery Pray, MD [facility] None  ?05/02/2021  9:15 AM Gery Pray, MD [facility] None  ?05/13/2021 10:30 AM [facility] [place]  ?05/13/2021  1:30 PM [name], MD [facility] None  ? ? ?  ?

## 2021-04-09 NOTE — Interval H&P Note (Signed)
History and Physical Interval Note: ? ?04/09/2021 ?7:16 AM ? ?Alyssa Becker  has presented today for surgery, with the diagnosis of CERVICAL CANCER.  The various methods of treatment have been discussed with the patient and family. After consideration of risks, benefits and other options for treatment, the patient has consented to  Procedure(s): ?TANDEM RING INSERTION (N/A) ?OPERATIVE ULTRASOUND (N/A) as a surgical intervention.  The patient's history has been reviewed, patient examined, no change in status, stable for surgery.  I have reviewed the patient's chart and labs.  Questions were answered to the patient's satisfaction.   ? ? ?Gery Pray ? ? ?

## 2021-04-09 NOTE — Transfer of Care (Signed)
Immediate Anesthesia Transfer of Care Note ? ?Patient: Alyssa Becker ? ?Procedure(s) Performed: TANDEM RING INSERTION (Cervix) ?OPERATIVE ULTRASOUND (Vagina ) ? ?Patient Location: PACU ? ?Anesthesia Type:General ? ?Level of Consciousness: awake, alert , oriented and patient cooperative ? ?Airway & Oxygen Therapy: Patient Spontanous Breathing and Patient connected to face mask oxygen ? ?Post-op Assessment: Report given to RN and Post -op Vital signs reviewed and stable ? ?Post vital signs: Reviewed and stable ? ?Last Vitals:  ?Vitals Value Taken Time  ?BP    ?Temp    ?Pulse    ?Resp    ?SpO2    ? ? ?Last Pain:  ?Vitals:  ? 04/09/21 0607  ?TempSrc: Oral  ?PainSc: 0-No pain  ?   ? ?Patients Stated Pain Goal: 4 (04/09/21 8616) ? ?Complications: No notable events documented. ?

## 2021-04-09 NOTE — Anesthesia Postprocedure Evaluation (Signed)
Anesthesia Post Note ? ?Patient: Alyssa Becker ? ?Procedure(s) Performed: TANDEM RING INSERTION (Cervix) ?OPERATIVE ULTRASOUND (Vagina ) ? ?  ? ?Patient location during evaluation: PACU ?Anesthesia Type: General ?Level of consciousness: awake and alert, oriented and patient cooperative ?Pain management: pain level controlled ?Vital Signs Assessment: post-procedure vital signs reviewed and stable ?Respiratory status: spontaneous breathing, nonlabored ventilation and respiratory function stable ?Cardiovascular status: blood pressure returned to baseline and stable ?Postop Assessment: no apparent nausea or vomiting ?Anesthetic complications: no ? ? ?No notable events documented. ? ?Last Vitals:  ?Vitals:  ? 04/09/21 0607 04/09/21 0805  ?BP: (!) 161/85   ?Pulse: 89 99  ?Resp: 18 (!) 22  ?Temp: 36.7 ?C 36.6 ?C  ?SpO2: 99%   ?  ?Last Pain:  ?Vitals:  ? 04/09/21 0805  ?TempSrc:   ?PainSc: 0-No pain  ? ? ?  ?  ?  ?  ?  ?  ? ?Jarome Matin [name] ? ? ? ? ?

## 2021-04-10 ENCOUNTER — Encounter (HOSPITAL_BASED_OUTPATIENT_CLINIC_OR_DEPARTMENT_OTHER): Payer: Self-pay | Admitting: Radiation Oncology

## 2021-04-26 ENCOUNTER — Encounter: Payer: Self-pay | Admitting: Radiology

## 2021-05-01 NOTE — Progress Notes (Incomplete)
?  Radiation Oncology         [phone] ?________________________________ ? ?Patient Name: Alyssa Becker ?MRN: [id number] ?DOB: May 04, 1995 ?Referring Physician: Jeral Pinch ?Date of Service: 04/09/2021 ? [facility],  ? ?                                                      End Of Treatment Note ? ?Diagnoses: C53.9-Malignant neoplasm of cervix uteri, unspecified ? ?Cancer Staging: Stage IIIC-2 adenocarcinoma of the cervix ? ?Intent: Curative ? ?Radiation Treatment Dates: 02/25/2021 through 04/09/2021 ?Site Technique Total Dose (Gy) Dose per Fx (Gy) Completed Fx Beam Energies  ?Pelvis: Pelvis IMRT 45/45 1.8 25/25 6X  ?Cervix: Cervix_Bst_Fx1 HDR-brachy 5.5/5.5 5.5 1/1 Ir-192  ? ?Narrative: She did not complete HDR brachytherapy. While in the operating room for treatment, Dr. Berline Lopes examined the patient and felt she would be a candidate for hysterectomy.  This will occur approximately a month from now. As for her IMRT, the patient tolerated this relatively well. During her final weekly treatment check for IMRT on 03/26/21, the patient denied any issues except for a rash on her left arm  ? ?Plan: The patient will follow-up with radiation oncology in one month . ? ?________________________________________________ ?----------------------------------- ? ?Blair Promise, PhD, MD ? ?This document serves as a record of services personally performed by Gery Pray, MD. It was created on his behalf by Roney Mans, a trained medical scribe. The creation of this record is based on the scribe's personal observations and the provider's statements to them. This document has been checked and approved by the attending provider. ? ?

## 2021-05-01 NOTE — Progress Notes (Signed)
?Radiation Oncology         [phone] ?________________________________ ? ?Name: Alyssa Becker MRN: [id number]  ?Date: 05/02/2021  DOB: April 24, 1995 ? ?Follow-Up Visit Note ? ?CC: [facility] Personal  Alyssa Mosses, MD ? ?  ICD-10-CM   ?1. Diarrhea, unspecified type  R19.7 CBC with Differential ([place] Only)  ?  Magnesium  ?  Comprehensive metabolic panel  ?  ?2. Malignant neoplasm of cervix, unspecified site ([place])  C53.9 CBC with Differential ([place] Only)  ?  Magnesium  ?  Comprehensive metabolic panel  ?  ? ? ?Diagnosis:  Stage IIIC-2 adenocarcinoma of the cervix ? ? Cancer Staging  ?Malignant neoplasm of cervix ([place]) ?Staging form: Cervix Uteri, AJCC Version 9 ?- Pathologic stage from 02/05/2021: FIGO Stage II (pT2, pN0, cM0) - Signed by Alyssa Lark, MD on 02/05/2021 ? ?Interval Since Last Radiation:  1 month and 3 days   ? ?Intent: Pre-op ? ?Radiation Treatment Dates: 02/25/2021 through 04/09/2021 ?Pelvic IMRT: 02/25/21 through 03/29/21 ?HDR-brachy: 04/09/21 ?Site Technique Total Dose (Gy) Dose per Fx (Gy) Completed Fx Beam Energies  ?Pelvis: Pelvis IMRT 45/45 1.8 25/25 6X  ?Cervix: Cervix_Bst_Fx1 HDR-brachy 5.5/5.5 5.5 1/1 Ir-192  ? ? ?Narrative:  The patient returns today for routine follow-up.  While in the operating room for her first HDR treatment, Dr. Berline Lopes examined the patient and felt she would be a candidate for hysterectomy.  This will occur approximately a month from now. As for her IMRT, the patient tolerated this relatively well. During her final weekly treatment check for IMRT on 03/26/21, the patient denied any issues except for a rash on her left arm.                               ? ?Since she was seen for her initial consultation on 02/06/21, the patient followed up with Dr. Berline Lopes on 02/18/21. During which time, the patient was noted to be doing very well from a postoperative standpoint. To review,  there was difficulty between distinguishing the  organ of origin of the patient's cancer.  Dr. Berline Lopes discussed the patient's case at the tumor board, with disposition to move forward under the presumption that this is metastatic cervical cancer. ? ?In the interval, the patient has also completed chemotherapy consisting of cisplatin on 02/22/21 through 03/22/21 under the care of Dr. Alvy Bimler. Per her most recent follow up visit with Dr. Alvy Bimler on 03/28/21, the patient was noted to have no residual side effects from treatment (and had no side-effects during treatment whatsoever). In terms of her anemia, Dr. Alvy Bimler noted that her hemoglobin continues to improve likely due to recent IV iron infusion and cessation of vaginal bleeding. As above, the plan was again discussed of proceeding with hysterectomy. The patient will return to Dr. Alvy Bimler 2 weeks after hysterectomy to discuss further care if any.  ? ?Since she completed her radiation therapy she has had persistent problems with diarrhea.  This was not present during her treatments but began after her brachytherapy procedure.  She has not tried any over-the-counter medications.  We discussed starting Imodium or Lomotil and she is more in favor of starting Lomotil.  Prescription will be sent later today.  Also recommended she start using a probiotic. ? ?She denies any pelvic pain, abdominal bloating, vaginal bleeding or discharge.  She denies any hematuria or rectal bleeding. ? ?Allergies:  is allergic to pineapple and prednisone. ? ?  Meds: ?Current Outpatient Medications  ?Medication Sig Dispense Refill  ? albuterol (VENTOLIN HFA) 108 (90 Base) MCG/ACT inhaler Inhale 1-2 puffs into the lungs every 6 (six) hours as needed for wheezing or shortness of breath.    ? Ascorbic Acid (VITAMIN C PO) Take 1 tablet by mouth once a week.    ? metFORMIN (GLUCOPHAGE) 500 MG tablet Take 1 tablet (500 mg total) by mouth 2 (two) times daily with a meal. (Patient taking differently: Take 500 mg by mouth at bedtime.) 60 tablet 1  ?  propranolol (INDERAL) 40 MG tablet Take 40 mg by mouth daily as needed (migraines).    ? lidocaine-prilocaine (EMLA) cream Apply to affected area once (Patient not taking: Reported on 02/18/2021) 30 g 3  ? ?No current facility-administered medications for this encounter.  ? ? ?Physical Findings: ?The patient is in no acute distress. Patient is alert and oriented. ? height is '5\' 6"'$  (1.676 m) and weight is 282 lb (127.9 kg). Her temperature is 97 ?F (36.1 ?C) (abnormal). Her blood pressure is 132/67 and her pulse is 92. Her respiration is 18 and oxygen saturation is 99%. .  No significant changes. Lungs are clear to auscultation bilaterally. Heart has regular rate and rhythm. No palpable cervical, supraclavicular, or axillary adenopathy. Abdomen soft, non-tender, normal bowel sounds.  Pelvic exam deferred in light of recent completion of treatment. ? ? ?Lab Findings: ?Lab Results  ?Component Value Date  ? WBC 6.6 03/27/2021  ? HGB 11.8 (L) 03/27/2021  ? HCT 37.8 03/27/2021  ? MCV 74.0 (L) 03/27/2021  ? PLT 210 03/27/2021  ? ? ?Radiographic Findings: ?[place] Intraoperative ? ?Result Date: 04/09/2021 ?CLINICAL DATA:  Ultrasound was provided for use by the ordering physician.  No provider Interpretation or professional fees incurred.    ? ?Impression: Stage IIIC-2 adenocarcinoma of the cervix ? ?The patient is recovering from the effects of radiation.  She has persistent diarrhea as above.  No blood in her stools.  Will initiate Imodium for this issue. ? ?Today the patient was given a vaginal dilator and instructions on its use.  She will proceed with a CT scan of the chest abdomen and pelvis soon and follow-up with Dr. Berline Lopes.  The patient understands that she should stop using the dilator after her surgery until she has okay from Dr. Berline Lopes. ? ?Plan: She will be sent to the lab for routine blood work including magnesium in light of her persistent problems with diarrhea. ? ? ? ?____________________________________ ? ?Blair Promise, PhD, MD ? ?This document serves as a record of services personally performed by Gery Pray, MD. It was created on his behalf by Roney Mans, a trained medical scribe. The creation of this record is based on the scribe's personal observations and the provider's statements to them. This document has been checked and approved by the attending provider. ? ?

## 2021-05-02 ENCOUNTER — Inpatient Hospital Stay: Payer: Medicaid Other | Attending: Radiation Oncology

## 2021-05-02 ENCOUNTER — Ambulatory Visit
Admission: RE | Admit: 2021-05-02 | Discharge: 2021-05-02 | Disposition: A | Discharge status: Home / Self Care | Payer: Medicaid Other | Source: Ambulatory Visit | Attending: Radiation Oncology | Admitting: Radiation Oncology

## 2021-05-02 ENCOUNTER — Other Ambulatory Visit: Payer: Self-pay

## 2021-05-02 ENCOUNTER — Encounter: Payer: Self-pay | Admitting: Radiation Oncology

## 2021-05-02 ENCOUNTER — Ambulatory Visit: Payer: Medicaid Other | Attending: Gynecologic Oncology

## 2021-05-02 VITALS — BP 132/67 | HR 92 | Temp 97.0°F | Resp 18 | Ht 66.0 in | Wt 282.0 lb

## 2021-05-02 DIAGNOSIS — C539 Malignant neoplasm of cervix uteri, unspecified: Secondary | ICD-10-CM | POA: Insufficient documentation

## 2021-05-02 DIAGNOSIS — R197 Diarrhea, unspecified: Secondary | ICD-10-CM

## 2021-05-02 DIAGNOSIS — Z9221 Personal history of antineoplastic chemotherapy: Secondary | ICD-10-CM | POA: Diagnosis not present

## 2021-05-02 DIAGNOSIS — Z923 Personal history of irradiation: Secondary | ICD-10-CM | POA: Diagnosis not present

## 2021-05-02 DIAGNOSIS — Z90710 Acquired absence of both cervix and uterus: Secondary | ICD-10-CM | POA: Insufficient documentation

## 2021-05-02 DIAGNOSIS — C541 Malignant neoplasm of endometrium: Secondary | ICD-10-CM | POA: Insufficient documentation

## 2021-05-02 DIAGNOSIS — D649 Anemia, unspecified: Secondary | ICD-10-CM | POA: Diagnosis not present

## 2021-05-02 DIAGNOSIS — D500 Iron deficiency anemia secondary to blood loss (chronic): Secondary | ICD-10-CM | POA: Insufficient documentation

## 2021-05-02 DIAGNOSIS — D509 Iron deficiency anemia, unspecified: Secondary | ICD-10-CM

## 2021-05-02 DIAGNOSIS — Z7984 Long term (current) use of oral hypoglycemic drugs: Secondary | ICD-10-CM | POA: Diagnosis not present

## 2021-05-02 DIAGNOSIS — C7961 Secondary malignant neoplasm of right ovary: Secondary | ICD-10-CM | POA: Insufficient documentation

## 2021-05-02 DIAGNOSIS — Z90722 Acquired absence of ovaries, bilateral: Secondary | ICD-10-CM | POA: Diagnosis not present

## 2021-05-02 LAB — CBC WITH DIFFERENTIAL (CANCER CENTER ONLY)
Abs Immature Granulocytes: 0.03 10*3/uL (ref 0.00–0.07)
Basophils Absolute: 0 10*3/uL (ref 0.0–0.1)
Basophils Relative: 1 %
Eosinophils Absolute: 1.2 10*3/uL — ABNORMAL HIGH (ref 0.0–0.5)
Eosinophils Relative: 15 %
HCT: 38.3 % (ref 36.0–46.0)
Hemoglobin: 12.8 g/dL (ref 12.0–15.0)
Immature Granulocytes: 0 %
Lymphocytes Relative: 14 %
Lymphs Abs: 1.1 10*3/uL (ref 0.7–4.0)
MCH: 25.8 pg — ABNORMAL LOW (ref 26.0–34.0)
MCHC: 33.4 g/dL (ref 30.0–36.0)
MCV: 77.1 fL — ABNORMAL LOW (ref 80.0–100.0)
Monocytes Absolute: 0.5 10*3/uL (ref 0.1–1.0)
Monocytes Relative: 6 %
Neutro Abs: 5.2 10*3/uL (ref 1.7–7.7)
Neutrophils Relative %: 64 %
Platelet Count: 226 10*3/uL (ref 150–400)
RBC: 4.97 MIL/uL (ref 3.87–5.11)
RDW: 24.7 % — ABNORMAL HIGH (ref 11.5–15.5)
WBC Count: 8 10*3/uL (ref 4.0–10.5)
nRBC: 0 % (ref 0.0–0.2)

## 2021-05-02 LAB — COMPREHENSIVE METABOLIC PANEL
ALT: 31 U/L (ref 0–44)
AST: 18 U/L (ref 15–41)
Albumin: 4.2 g/dL (ref 3.5–5.0)
Alkaline Phosphatase: 147 U/L — ABNORMAL HIGH (ref 38–126)
Anion gap: 7 (ref 5–15)
BUN: 14 mg/dL (ref 6–20)
CO2: 28 mmol/L (ref 22–32)
Calcium: 9.4 mg/dL (ref 8.9–10.3)
Chloride: 104 mmol/L (ref 98–111)
Creatinine, Ser: 0.63 mg/dL (ref 0.44–1.00)
GFR, Estimated: >60 mL/min (ref >60)
Glucose, Bld: 124 mg/dL — ABNORMAL HIGH (ref 70–99)
Potassium: 3.8 mmol/L (ref 3.5–5.1)
Sodium: 139 mmol/L (ref 135–145)
Total Bilirubin: 0.3 mg/dL (ref 0.3–1.2)
Total Protein: 7.3 g/dL (ref 6.5–8.1)

## 2021-05-02 LAB — MAGNESIUM: Magnesium: 1.7 mg/dL (ref 1.7–2.4)

## 2021-05-02 MED ORDER — SODIUM CHLORIDE 0.9% FLUSH
10.0000 mL | Freq: Once | INTRAVENOUS | Status: AC
  Administered 2021-05-02: 10 mL

## 2021-05-02 MED ORDER — HEPARIN SOD (PORK) LOCK FLUSH 100 UNIT/ML IV SOLN
500.0000 [IU] | Freq: Once | INTRAVENOUS | Status: AC
  Administered 2021-05-02: 500 [IU]

## 2021-05-02 MED ORDER — DIPHENOXYLATE-ATROPINE 2.5-0.025 MG PO TABS: 1.0000 | ORAL_TABLET | Freq: Four times a day (QID) | ORAL | 0 refills | Status: DC | PRN

## 2021-05-02 NOTE — Addendum Note (Signed)
Encounter addended by: Gery Pray, MD on: 05/02/2021 10:19 AM ? Actions taken: Order list changed

## 2021-05-02 NOTE — Progress Notes (Signed)
Alyssa Becker is here today for follow up post radiation to the pelvic. ? ?They completed their radiation on: 04/09/21 ? ?Does the patient complain of any of the following: ? ?Pain:denies ?Abdominal bloating: denies ?Diarrhea/Constipation: having diarrhea immediately after eating all meals and snacks ?Nausea/Vomiting: denies ?Vaginal Discharge: denies ?Blood in Urine or Stool: denies ?Urinary Issues (dysuria/incomplete emptying/ incontinence/ increased frequency/urgency): urgency, nocturia x 0-1 ?Does patient report using vaginal dilator 2-3 times a week and/or sexually active 2-3 weeks: vaginal dilator and education provided at this visit ?Post radiation skin changes: denies ? ? ?Additional comments if applicable: nothing of note ?  ?Vitals:  ? 05/02/21 0907  ?BP: 132/67  ?Pulse: 92  ?Resp: 18  ?Temp: (!) 97 ?F (36.1 ?C)  ?SpO2: 99%  ?Weight: 282 lb (127.9 kg)  ?Height: '5\' 6"'$  (1.676 m)  ? ? ?

## 2021-05-02 NOTE — Patient Instructions (Signed)

## 2021-05-07 ENCOUNTER — Telehealth: Payer: Self-pay | Admitting: *Deleted

## 2021-05-07 ENCOUNTER — Telehealth: Payer: Self-pay | Admitting: Oncology

## 2021-05-07 NOTE — Telephone Encounter (Signed)
Spoke with the patient and moved from 4/24 to 4/25 ?

## 2021-05-07 NOTE — Telephone Encounter (Signed)
Left a message advising that we are holding 05/21/2021 for surgery so she may get a call from Pre Op.   ?

## 2021-05-09 ENCOUNTER — Other Ambulatory Visit: Payer: Self-pay | Admitting: Gynecologic Oncology

## 2021-05-09 DIAGNOSIS — C579 Malignant neoplasm of female genital organ, unspecified: Secondary | ICD-10-CM

## 2021-05-13 ENCOUNTER — Ambulatory Visit: Payer: Medicaid Other | Admitting: Gynecologic Oncology

## 2021-05-13 ENCOUNTER — Other Ambulatory Visit: Payer: Self-pay | Admitting: Gynecologic Oncology

## 2021-05-13 ENCOUNTER — Telehealth: Payer: Self-pay | Admitting: *Deleted

## 2021-05-13 ENCOUNTER — Ambulatory Visit (HOSPITAL_COMMUNITY)
Admission: RE | Admit: 2021-05-13 | Discharge: 2021-05-13 | Disposition: A | Discharge status: Home / Self Care | Payer: Medicaid Other | Source: Ambulatory Visit | Attending: Hematology and Oncology | Admitting: Hematology and Oncology

## 2021-05-13 ENCOUNTER — Encounter: Payer: Self-pay | Admitting: Gynecologic Oncology

## 2021-05-13 DIAGNOSIS — K769 Liver disease, unspecified: Secondary | ICD-10-CM

## 2021-05-13 DIAGNOSIS — C539 Malignant neoplasm of cervix uteri, unspecified: Secondary | ICD-10-CM | POA: Insufficient documentation

## 2021-05-13 MED ORDER — IOHEXOL 300 MG/ML  SOLN
100.0000 mL | Freq: Once | INTRAMUSCULAR | Status: AC | PRN
  Administered 2021-05-13: 100 mL via INTRAVENOUS

## 2021-05-13 MED ORDER — SODIUM CHLORIDE (PF) 0.9 % IJ SOLN
INTRAMUSCULAR | Status: AC
  Filled 2021-05-13: qty 50

## 2021-05-13 MED ORDER — HEPARIN SOD (PORK) LOCK FLUSH 100 UNIT/ML IV SOLN
INTRAVENOUS | Status: AC
Start: 2021-05-13 — End: 2021-05-13
  Administered 2021-05-13: 500 [IU]
  Filled 2021-05-13: qty 5

## 2021-05-13 NOTE — Progress Notes (Signed)
Called patient, discussed CT results. Need STAT MRI of the liver to establish if lesion has features concerning for metastatic disease. ? ?Jeral Pinch MD ?Gynecologic Oncology ? ?

## 2021-05-13 NOTE — Telephone Encounter (Signed)
Spoke with the patient's friend and gave date/time for STAT MRI tomorrow. Explained to the patient's friend to check in for the scan though the ER department and to arrive between 6:30 and 6:45 am, and NPO after midnight.  ?

## 2021-05-14 ENCOUNTER — Inpatient Hospital Stay: Payer: Medicaid Other | Admitting: Gynecologic Oncology

## 2021-05-14 ENCOUNTER — Ambulatory Visit (HOSPITAL_COMMUNITY)
Admission: RE | Admit: 2021-05-14 | Discharge: 2021-05-14 | Disposition: A | Discharge status: Home / Self Care | Payer: Medicaid Other | Source: Ambulatory Visit | Attending: Gynecologic Oncology | Admitting: Gynecologic Oncology

## 2021-05-14 ENCOUNTER — Inpatient Hospital Stay (HOSPITAL_BASED_OUTPATIENT_CLINIC_OR_DEPARTMENT_OTHER): Payer: Medicaid Other | Admitting: Gynecologic Oncology

## 2021-05-14 ENCOUNTER — Encounter: Payer: Self-pay | Admitting: Gynecologic Oncology

## 2021-05-14 VITALS — BP 152/86 | HR 87 | Temp 98.9°F | Resp 20 | Ht 66.0 in | Wt 284.7 lb

## 2021-05-14 DIAGNOSIS — Z6841 Body Mass Index (BMI) 40.0 and over, adult: Secondary | ICD-10-CM

## 2021-05-14 DIAGNOSIS — Z7189 Other specified counseling: Secondary | ICD-10-CM

## 2021-05-14 DIAGNOSIS — C539 Malignant neoplasm of cervix uteri, unspecified: Secondary | ICD-10-CM

## 2021-05-14 DIAGNOSIS — K769 Liver disease, unspecified: Secondary | ICD-10-CM | POA: Diagnosis present

## 2021-05-14 DIAGNOSIS — C579 Malignant neoplasm of female genital organ, unspecified: Secondary | ICD-10-CM

## 2021-05-14 MED ORDER — SENNOSIDES-DOCUSATE SODIUM 8.6-50 MG PO TABS: 2.0000 | ORAL_TABLET | Freq: Every day | ORAL | 0 refills | Status: DC

## 2021-05-14 MED ORDER — OXYCODONE HCL 5 MG PO TABS: 5.0000 mg | ORAL_TABLET | ORAL | 0 refills | Status: DC | PRN

## 2021-05-14 MED ORDER — IBUPROFEN 800 MG PO TABS: 800.0000 mg | ORAL_TABLET | Freq: Three times a day (TID) | ORAL | 0 refills | Status: DC | PRN

## 2021-05-14 MED ORDER — GADOBUTROL 1 MMOL/ML IV SOLN
10.0000 mL | Freq: Once | INTRAVENOUS | Status: AC | PRN
  Administered 2021-05-14: 10 mL via INTRAVENOUS

## 2021-05-14 NOTE — H&P (View-Only) (Signed)
Gynecologic Oncology Return Clinic Visit ? ?05/14/2021 ? ?Reason for Visit: Treatment planning ? 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 ?PD-L1 CPS 10% ?  ?Malignant neoplasm of cervix (HCC)  ?10/01/2020 Imaging  ? US pelvis ?1. Large indeterminate right adnexal mass with solid hypervascular and cystic components. This appears separate from the base of the appendix on preceding CT and is likely arising from the right ovary based on relationship to the gonadal vessels on CT. Given the patient's young age, hypervascularity and leukocytosis, findings could represent an atypical tubo-ovarian abscess. However, the appearance is concerning for ovarian malignancy despite the ?patient's young age. Recommend prompt gynecology consultation. Pelvic MRI without and with contrast may be helpful for further evaluation. ?2. The left ovary appears normal. Mild nonspecific thickening of the endometrium, within physiologic limits. ?  ?01/02/2021 Pathology Results  ? SPECIMEN ADEQUACY: ?Satisfactory for evaluation; transformation zone component PRESENT. ?INTERPRETATION: ?- Adenocarcinoma, NOS ?  ?01/15/2021 Imaging  ? MRI pelvis ? ?13.0 cm cystic and solid mass arising from the right ovary, highly suspicious for ovarian carcinoma. ?  ?Mild endometrial thickening and 4.5 cm soft tissue mass within the endocervical canal. Differential diagnosis includes prolapsing endometrial polyp or carcinoma, or primary cervical carcinoma. ?  ?No evidence of pelvic metastatic disease. ?  ?01/16/2021 Initial Diagnosis  ? Malignant neoplasm of cervix (HCC) ?  ?01/22/2021 PET scan  ? 1. Large cystic/necrotic right adnexal mass is markedly hypermetabolic and consistent with known neoplasm. ?2. Extensive hypermetabolic tumor involving the endometrium and cervix. ?3. Scattered borderline enlarged and mildly hypermetabolic retroperitoneal and  external iliac nodes. ?4. No findings for metastatic disease involving the chest or bony structures. ?  ?01/24/2021 Surgery  ? Pre-operative Diagnosis: Complex adnexal mass, elevated CA-125, locally invasive cervical adenocarcinoma versus endometrial adenocarcinoma ?  ?Post-operative Diagnosis: same, high-grade carcinoma of the right ovary, suspected history of superinfection of the adnexal mass ?  ?Operation: Robotic-assisted laparoscopic bilateral salpingectomy, right oophorectomy, lysis of adhesions of approximately 45 minutes, right ureterolysis ?  ?Surgeon: Layni Kreamer MD ?   ?Operative Findings: On EUA, cervix enlarged and firm, fleshy tumor replacing the endocervix.  No definitive parametrial involvement appreciated.  Even gentle exam because significant bleeding so vagina was packed with lap sponges for the remainder of surgery.  On intra-abdominal exam, normal upper abdominal survey including omentum, liver edge, diaphragm, and stomach.  Normal small bowel.  Some physiologic adhesions of the sigmoid epiploica to the left sidewall.  Normal-appearing left ovary measuring approximately 3-4 cm.  Normal-appearing bilateral fallopian tubes.  Uterus approximately 8-10 cm and overall normal in appearance.  Right ovary replaced by a smooth but vascular 10 cm mass with multiple loculations.  Ovary with dense adhesions to the right pelvic sidewall.  Upon very careful manipulation of the mass for its removal, there was some drainage of fluid noted that was somewhat purulent.  This was sent for culture and Gram stain.  Upon contained mass removal with fractionation of the mass, tumor versus necrotic material was noted.  Frozen section consistent with high-grade carcinoma, unable to differentiate on frozen section whether this is the same cancer being seen on her cervical biopsies.  Right ureterolysis performed given oozing services from the broad ligament and peritoneum where adhesions were lysed between the ovary  and peritoneum, to assure ureter not in close proximity.  No obvious adenopathy.  No ascites.  Left ovary transposed out of   the pelvis along the left gutter, clips placed along the sidewall as well as the superior and inferior aspect of the ovary. ?  ?  ?01/24/2021 Pathology Results  ? FINAL MICROSCOPIC DIAGNOSIS:  ? ?A. OVARY AND FALLOPIAN TUBE, RIGHT, SALPINGO OOPHORECTOMY:  ?- Endometrioid carcinoma, moderately differentiated, involving right ovary  ?- No evidence of ovarian surface involvement by carcinoma  ?- Segment of fallopian tube, negative for carcinoma  ?- See oncology table  ?- See comment  ? ?B. FALLOPIAN TUBE, LEFT, SALPINGECTOMY:  ?- Segment of fallopian tube, negative for carcinoma  ? ?A.  Immunostain for p16 shows patchy staining.  Immunostain for p53 shows a normal, wild-type expression pattern.  This immunoprofile is consistent with above interpretation.  Patient's previous cervical  ?biopsy was reviewed - it shows moderately differentiated carcinoma with a very similar histomorphology and very likely presents metastasis from the ovarian tumor to the cervix.  ?  ?02/05/2021 Cancer Staging  ? Staging form: Cervix Uteri, AJCC Version 9 ?- Pathologic stage from 02/05/2021: FIGO Stage II (pT2, pN0, cM0) - Signed by [name], MD on 02/05/2021 ?Stage prefix: Initial diagnosis ? ?  ?02/14/2021 Procedure  ? Successful placement of a right internal jugular approach power ?injectable Port-A-Cath. The catheter is ready for immediate use ?  ?02/22/2021 - 03/22/2021 Chemotherapy  ? Patient is on Treatment Plan : Cervical cancer Cisplatin q7d  ? ?  ?  ?05/13/2021 Imaging  ? 1. Status post bilateral oophorectomy. Previously seen large hypermetabolic right adnexal mass has been resected. Nodular soft tissue in the vicinity of both the left and right oophorectomy bed, likely postoperative in nature. Attention on follow-up. ?2. Previously noted uterine/cervical mass is resolved, consistent with treatment response. ?3.  Prominent, previously FDG avid iliac and retroperitoneal lymph nodes are slightly diminished in size, consistent with treatment response. ?4. Hypodense liver lesion of hepatic segment IVa measuring 1.5 x 1.2 cm, not previously appreciated by contrast enhanced CT or PET-CT, incompletely characterized although worrisome for hepatic metastasis. This could be further characterized by multiphasic contrast enhanced MRI. ?5. Hepatomegaly and hepatic steatosis. ?6. Splenomegaly. ?  ?  ? ?She completed EBRT with 45Gy to the pelvis, one HDR brachytherapy procedure on 3/21. ? ?Interval History: ?Doing well.  Has intermittent diarrhea, not using any medications.  This is overall improved.  Has intermittent low back pain.  Denies any vaginal bleeding or discharge.  She endorses occasional hot flashes.  Endorses a good appetite, denies nausea or emesis except with recent contrast. ? ?Past Medical/Surgical History: ?Past Medical History:  ?Diagnosis Date  ? Anxiety   ? Asthma   ? Depression   ? History of chemotherapy   ? 02-22-2021  to 03-22-2021  for cervical cancer  ? History of COVID-19 02/2020  ? followed by pcp   (04-03-2021  per pt checks blood sugar once weekly)  ? History of external beam radiation therapy   ? 02-25-2021  to 03-29-2021 for cervical cancer  ? History of radiation therapy   ? cervix - tandem and ring 04/09/2021  Dr Gery Pray  ? Iron deficiency anemia due to chronic blood loss   ? Malignant neoplasm cervix ([place])   ? oncologist--- dr gorsuch// radiation onologist-- dr Sondra Come;   dx 01-16-2021;   01-24-2021  s/p robotic RSO and left salpingectomy for right ovarian mass (endometrioid carcinoma);  completed chemo 03-22-2021 and IMRT 03-29-2021 (scheduled for high dose brachytherapy 04-09-2021)  ? Migraines   ? migraine  ? Morbid obesity ([place])   ?  Type 2 diabetes mellitus ([place])   ? ? ?Past Surgical History:  ?Procedure Laterality Date  ? IR IMAGING GUIDED PORT INSERTION  02/13/2021  ? OPERATIVE ULTRASOUND N/A  04/09/2021  ? Procedure: OPERATIVE ULTRASOUND;  Surgeon: Gery Pray, MD;  Location: [facility];  Service: Urology;  Laterality: N/A;  ? TANDEM RING INSERTION N/A 04/09/2021  ? Procedure: TAND

## 2021-05-14 NOTE — Patient Instructions (Addendum)
Preparing for your Surgery ? ?Plan for surgery on May 21, 2021 with Dr. Jeral Pinch at [place] will be scheduled for robotic assisted total laparoscopic hysterectomy (removal of the uterus and cervix), left oophorectomy (removal of left ovary), possible mini laparotomy (slightly larger incision on the abdomen), possible laparotomy (larger incision on your abdomen if needed), and any other indicated procedures.   ? ?Pre-operative Testing ?-You will receive a phone call from presurgical testing at [facility] to arrange for a pre-operative appointment and lab work. ? ?-Bring your insurance card, copy of an advanced directive if applicable, medication list ? ?-At that visit, you will be asked to sign a consent for a possible blood transfusion in case a transfusion becomes necessary during surgery.  The need for a blood transfusion is rare but having consent is a necessary part of your care.    ? ?-You should not be taking blood thinners or aspirin at least ten days prior to surgery unless instructed by your surgeon. ? ?-Do not take supplements such as fish oil (omega 3), red yeast rice, turmeric before your surgery. You want to avoid medications with aspirin in them including headache powders such as BC or Goody's), Excedrin migraine. ? ?Day Before Surgery at Home ?-You will be asked to take in a light diet the day before surgery. You will be advised you can have clear liquids up until 3 hours before your surgery.   ? ?Eat a light diet the day before surgery.  Examples including soups, broths, toast, yogurt, mashed potatoes.  AVOID GAS PRODUCING FOODS. Things to avoid include carbonated beverages (fizzy beverages, sodas), raw fruits and raw vegetables (uncooked), or beans.  ? ?If your bowels are filled with gas, your surgeon will have difficulty visualizing your pelvic organs which increases your surgical risks. ? ?Your role in recovery ?Your role is to become active as soon as directed  by your doctor, while still giving yourself time to heal.  Rest when you feel tired. You will be asked to do the following in order to speed your recovery: ? ?- Cough and breathe deeply. This helps to clear and expand your lungs and can prevent pneumonia after surgery.  ?- STAY ACTIVE WHEN YOU GET HOME. Do mild physical activity. Walking or moving your legs help your circulation and body functions return to normal. Do not try to get up or walk alone the first time after surgery.   ?-If you develop swelling on one leg or the other, pain in the back of your leg, redness/warmth in one of your legs, please call the office or go to the Emergency Room to have a doppler to rule out a blood clot. For shortness of breath, chest pain-seek care in the Emergency Room as soon as possible. ?- Actively manage your pain. Managing your pain lets you move in comfort. We will ask you to rate your pain on a scale of zero to 10. It is your responsibility to tell your doctor or nurse where and how much you hurt so your pain can be treated. ? ?Special Considerations ?-If you are diabetic, you may be placed on insulin after surgery to have closer control over your blood sugars to promote healing and recovery.  This does not mean that you will be discharged on insulin.  If applicable, your oral antidiabetics will be resumed when you are tolerating a solid diet. ? ?-Your final pathology results from surgery should be available around one week after surgery  and the results will be relayed to you when available. ? ?-FMLA forms can be faxed to [phone] and please allow 5-7 business days for completion. ? ?Pain Management After Surgery ?-You have been prescribed your pain medication and bowel regimen medications before surgery so that you can have these available when you are discharged from the hospital. The pain medication is for use ONLY AFTER surgery and a new prescription will not be given.  ? ?-Make sure that you have Tylenol and  Ibuprofen at home to use on a regular basis after surgery for pain control. We recommend alternating the medications every hour to six hours since they work differently and are processed in the body differently for pain relief. ? ?-Review the attached handout on narcotic use and their risks and side effects.  ? ?Bowel Regimen ?-You have been prescribed Sennakot-S to take nightly to prevent constipation especially if you are taking the narcotic pain medication intermittently.  It is important to prevent constipation and drink adequate amounts of liquids. You can stop taking this medication when you are not taking pain medication and you are back on your normal bowel routine. ? ?Risks of Surgery ?Risks of surgery are low but include bleeding, infection, damage to surrounding structures, re-operation, blood clots, and very rarely death. ? ? ?Blood Transfusion Information (For the consent to be signed before surgery) ? ?We will be checking your blood type before surgery so in case of emergencies, we will know what type of blood you would need. ? ?                                          WHAT IS A BLOOD TRANSFUSION? ? ?A transfusion is the replacement of blood or some of its parts. Blood is made up of multiple cells which provide different functions. ?Red blood cells carry oxygen and are used for blood loss replacement. ?White blood cells fight against infection. ?Platelets control bleeding. ?Plasma helps clot blood. ?Other blood products are available for specialized needs, such as hemophilia or other clotting disorders. ?BEFORE THE TRANSFUSION  ?Who gives blood for transfusions?  ?You may be able to donate blood to be used at a later date on yourself (autologous donation). ?Relatives can be asked to donate blood. This is generally not any safer than if you have received blood from a stranger. The same precautions are taken to ensure safety when a relative's blood is donated. ?Healthy volunteers who are fully evaluated  to make sure their blood is safe. This is blood bank blood. ?Transfusion therapy is the safest it has ever been in the practice of medicine. Before blood is taken from a donor, a complete history is taken to make sure that person has no history of diseases nor engages in risky social behavior (examples are intravenous drug use or sexual activity with multiple partners). The donor's travel history is screened to minimize risk of transmitting infections, such as malaria. The donated blood is tested for signs of infectious diseases, such as HIV and hepatitis. The blood is then tested to be sure it is compatible with you in order to minimize the chance of a transfusion reaction. If you or a relative donates blood, this is often done in anticipation of surgery and is not appropriate for emergency situations. It takes many days to process the donated blood. ?RISKS AND COMPLICATIONS ?Although transfusion therapy is very safe  and saves many lives, the main dangers of transfusion include:  ?Getting an infectious disease. ?Developing a transfusion reaction. This is an allergic reaction to something in the blood you were given. Every precaution is taken to prevent this. ?The decision to have a blood transfusion has been considered carefully by your caregiver before blood is given. Blood is not given unless the benefits outweigh the risks. ? ?AFTER SURGERY INSTRUCTIONS ? ?Return to work: 4-6 weeks if applicable ? ?Activity: ?1. Be up and out of the bed during the day.  Take a nap if needed.  You may walk up steps but be careful and use the hand rail.  Stair climbing will tire you more than you think, you may need to stop part way and rest.  ? ?2. No lifting or straining for 6 weeks over 10 pounds. No pushing, pulling, straining for 6 weeks. ? ?3. No driving for around 1 week(s).  Do not drive if you are taking narcotic pain medicine and make sure that your reaction time has returned.  ? ?4. You can shower as soon as the next  day after surgery. Shower daily.  Use your regular soap and water (not directly on the incision) and pat your incision(s) dry afterwards; don't rub.  No tub baths or submerging your body in water until clear

## 2021-05-14 NOTE — Progress Notes (Signed)
Gynecologic Oncology Return Clinic Visit ? ?05/14/2021 ? ?Reason for Visit: Treatment planning ? 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 ?PD-L1 CPS 10% ?  ?Malignant neoplasm of cervix ([place])  ?10/01/2020 Imaging  ? US pelvis ?1. Large indeterminate right adnexal mass with solid hypervascular and cystic components. This appears separate from the base of the appendix on preceding CT and is likely arising from the right ovary based on relationship to the gonadal vessels on CT. Given the patient's young age, hypervascularity and leukocytosis, findings could represent an atypical tubo-ovarian abscess. However, the appearance is concerning for ovarian malignancy despite the ?patient's young age. Recommend prompt gynecology consultation. Pelvic MRI without and with contrast may be helpful for further evaluation. ?2. The left ovary appears normal. Mild nonspecific thickening of the endometrium, within physiologic limits. ?  ?01/02/2021 Pathology Results  ? SPECIMEN ADEQUACY: ?Satisfactory for evaluation; transformation zone component PRESENT. ?INTERPRETATION: ?- Adenocarcinoma, NOS ?  ?01/15/2021 Imaging  ? MRI pelvis ? ?13.0 cm cystic and solid mass arising from the right ovary, highly suspicious for ovarian carcinoma. ?  ?Mild endometrial thickening and 4.5 cm soft tissue mass within the endocervical canal. Differential diagnosis includes prolapsing endometrial polyp or carcinoma, or primary cervical carcinoma. ?  ?No evidence of pelvic metastatic disease. ?  ?01/16/2021 Initial Diagnosis  ? Malignant neoplasm of cervix [facility]) ?  ?01/22/2021 PET scan  ? 1. Large cystic/necrotic right adnexal mass is markedly hypermetabolic and consistent with known neoplasm. ?2. Extensive hypermetabolic tumor involving the endometrium and cervix. ?3. Scattered borderline enlarged and mildly hypermetabolic retroperitoneal and  external iliac nodes. ?4. No findings for metastatic disease involving the chest or bony structures. ?  ?01/24/2021 Surgery  ? Pre-operative Diagnosis: Complex adnexal mass, elevated CA-125, locally invasive cervical adenocarcinoma versus endometrial adenocarcinoma ?  ?Post-operative Diagnosis: same, high-grade carcinoma of the right ovary, suspected history of superinfection of the adnexal mass ?  ?Operation: Robotic-assisted laparoscopic bilateral salpingectomy, right oophorectomy, lysis of adhesions of approximately 45 minutes, right ureterolysis ?  ?Surgeon: Jeral Pinch MD ?   ?Operative Findings: On EUA, cervix enlarged and firm, fleshy tumor replacing the endocervix.  No definitive parametrial involvement appreciated.  Even gentle exam because significant bleeding so vagina was packed with lap sponges for the remainder of surgery.  On intra-abdominal exam, normal upper abdominal survey including omentum, liver edge, diaphragm, and stomach.  Normal small bowel.  Some physiologic adhesions of the sigmoid epiploica to the left sidewall.  Normal-appearing left ovary measuring approximately 3-4 cm.  Normal-appearing bilateral fallopian tubes.  Uterus approximately 8-10 cm and overall normal in appearance.  Right ovary replaced by a smooth but vascular 10 cm mass with multiple loculations.  Ovary with dense adhesions to the right pelvic sidewall.  Upon very careful manipulation of the mass for its removal, there was some drainage of fluid noted that was somewhat purulent.  This was sent for culture and Gram stain.  Upon contained mass removal with fractionation of the mass, tumor versus necrotic material was noted.  Frozen section consistent with high-grade carcinoma, unable to differentiate on frozen section whether this is the same cancer being seen on her cervical biopsies.  Right ureterolysis performed given oozing services from the broad ligament and peritoneum where adhesions were lysed between the ovary  and peritoneum, to assure ureter not in close proximity.  No obvious adenopathy.  No ascites.  Left ovary transposed out of  the pelvis along the left gutter, clips placed along the sidewall as well as the superior and inferior aspect of the ovary. ?  ?  ?01/24/2021 Pathology Results  ? FINAL MICROSCOPIC DIAGNOSIS:  ? ?A. OVARY AND FALLOPIAN TUBE, RIGHT, SALPINGO OOPHORECTOMY:  ?- Endometrioid carcinoma, moderately differentiated, involving right ovary  ?- No evidence of ovarian surface involvement by carcinoma  ?- Segment of fallopian tube, negative for carcinoma  ?- See oncology table  ?- See comment  ? ?B. FALLOPIAN TUBE, LEFT, SALPINGECTOMY:  ?- Segment of fallopian tube, negative for carcinoma  ? ?A.  Immunostain for p16 shows patchy staining.  Immunostain for p53 shows a normal, wild-type expression pattern.  This immunoprofile is consistent with above interpretation.  Patient's previous cervical  ?biopsy was reviewed - it shows moderately differentiated carcinoma with a very similar histomorphology and very likely presents metastasis from the ovarian tumor to the cervix.  ?  ?02/05/2021 Cancer Staging  ? Staging form: Cervix Uteri, AJCC Version 9 ?- Pathologic stage from 02/05/2021: FIGO Stage II (pT2, pN0, cM0) - Signed by [name], MD on 02/05/2021 ?Stage prefix: Initial diagnosis ? ?  ?02/14/2021 Procedure  ? Successful placement of a right internal jugular approach power ?injectable Port-A-Cath. The catheter is ready for immediate use ?  ?02/22/2021 - 03/22/2021 Chemotherapy  ? Patient is on Treatment Plan : Cervical cancer Cisplatin q7d  ? ?  ?  ?05/13/2021 Imaging  ? 1. Status post bilateral oophorectomy. Previously seen large hypermetabolic right adnexal mass has been resected. Nodular soft tissue in the vicinity of both the left and right oophorectomy bed, likely postoperative in nature. Attention on follow-up. ?2. Previously noted uterine/cervical mass is resolved, consistent with treatment response. ?3.  Prominent, previously FDG avid iliac and retroperitoneal lymph nodes are slightly diminished in size, consistent with treatment response. ?4. Hypodense liver lesion of hepatic segment IVa measuring 1.5 x 1.2 cm, not previously appreciated by contrast enhanced CT or PET-CT, incompletely characterized although worrisome for hepatic metastasis. This could be further characterized by multiphasic contrast enhanced MRI. ?5. Hepatomegaly and hepatic steatosis. ?6. Splenomegaly. ?  ?  ? ?She completed EBRT with 45Gy to the pelvis, one HDR brachytherapy procedure on 3/21. ? ?Interval History: ?Doing well.  Has intermittent diarrhea, not using any medications.  This is overall improved.  Has intermittent low back pain.  Denies any vaginal bleeding or discharge.  She endorses occasional hot flashes.  Endorses a good appetite, denies nausea or emesis except with recent contrast. ? ?Past Medical/Surgical History: ?Past Medical History:  ?Diagnosis Date  ? Anxiety   ? Asthma   ? Depression   ? History of chemotherapy   ? 02-22-2021  to 03-22-2021  for cervical cancer  ? History of COVID-19 02/2020  ? followed by pcp   (04-03-2021  per pt checks blood sugar once weekly)  ? History of external beam radiation therapy   ? 02-25-2021  to 03-29-2021 for cervical cancer  ? History of radiation therapy   ? cervix - tandem and ring 04/09/2021  Dr Gery Pray  ? Iron deficiency anemia due to chronic blood loss   ? Malignant neoplasm cervix ([place])   ? oncologist--- dr gorsuch// radiation onologist-- dr Sondra Come;   dx 01-16-2021;   01-24-2021  s/p robotic RSO and left salpingectomy for right ovarian mass (endometrioid carcinoma);  completed chemo 03-22-2021 and IMRT 03-29-2021 (scheduled for high dose brachytherapy 04-09-2021)  ? Migraines   ? migraine  ? Morbid obesity ([place])   ?  Type 2 diabetes mellitus ([place])   ? ? ?Past Surgical History:  ?Procedure Laterality Date  ? IR IMAGING GUIDED PORT INSERTION  02/13/2021  ? OPERATIVE ULTRASOUND N/A  04/09/2021  ? Procedure: OPERATIVE ULTRASOUND;  Surgeon: Gery Pray, MD;  Location: [facility];  Service: Urology;  Laterality: N/A;  ? TANDEM RING INSERTION N/A 04/09/2021  ? Procedure: TAND

## 2021-05-16 NOTE — Progress Notes (Addendum)
COVID Vaccine Completed: yes x2 ?Date COVID Vaccine completed: 07/21/19, 08/18/19 ?Has received booster: ?COVID vaccine manufacturer: Moderna    ? ?Date of COVID positive in last 90 days: no ? ?PCP - [facility]  ?Cardiologist - n/a ? ?Chest x-ray - CT 05/13/21 Epic ?EKG - 01/18/21 Epic ?Stress Test - n/a ?ECHO - n/a ?Cardiac Cath - n/a ?Pacemaker/ICD device last checked: n/a ?Spinal Cord Stimulator: n/a ? ?Bowel Prep - light diet day before surgery ? ?Sleep Study - n/a ?CPAP -  ? ?Fasting Blood Sugar - 110-150 ?Checks Blood Sugar one time per week ? ?Blood Thinner Instructions: n/a ?Aspirin Instructions: ?Last Dose: ? ?Activity level: Can go up a flight of stairs and perform activities of daily living without stopping and without symptoms of chest pain or shortness of breath. ?   ?Anesthesia review: last chemo and radiation 2/23, port ? ?Patient denies shortness of breath, fever, cough and chest pain at PAT appointment ? ? ?Patient verbalized understanding of instructions that were given to them at the PAT appointment. Patient was also instructed that they will need to review over the PAT instructions again at home before surgery.  ?

## 2021-05-16 NOTE — Patient Instructions (Addendum)
DUE TO COVID-19 ONLY TWO VISITORS  (aged 25 and older)  ARE ALLOWED TO COME WITH YOU AND STAY IN THE WAITING ROOM ONLY DURING PRE OP AND PROCEDURE.   ?**NO VISITORS ARE ALLOWED IN THE SHORT STAY AREA OR RECOVERY ROOM!!** ? ? Your procedure is scheduled on: 05/21/21 ? ? Report to [facility] Main Entrance ? ?  Report to admitting at 7:45 AM ? ? Call this number if you have problems the morning of surgery [phone] ? ? Do not eat food :After Midnight. ? ? After Midnight you may have the following liquids until 7:00 AM DAY OF SURGERY ? ?Water ?Black Coffee (sugar ok, NO MILK/CREAM OR CREAMERS)  ?Tea (sugar ok, NO MILK/CREAM OR CREAMERS) regular and decaf                             ?Plain Jell-O (NO RED)                                           ?Fruit ices (not with fruit pulp, NO RED)                                     ?Popsicles (NO RED)                                                                  ?Juice: apple, WHITE grape, WHITE cranberry ?Sports drinks like Gatorade (NO RED) ?Clear broth(vegetable,chicken,beef) ?  ?The day of surgery:  ?Drink ONE (1) Pre-Surgery G2 at 7:00 AM the morning of surgery. Drink in one sitting. Do not sip.  ?This drink was given to you during your hospital  ?pre-op appointment visit. ?Nothing else to drink after completing the  ?Pre-Surgery Clear Ensure. ?  ?       If you have questions, please contact your surgeon?s office. ? ? ?FOLLOW BOWEL PREP AND ANY ADDITIONAL PRE OP INSTRUCTIONS YOU RECEIVED FROM YOUR SURGEON'S OFFICE!!! ?  ?  ?Oral Hygiene is also important to reduce your risk of infection.                                    ?Remember - BRUSH YOUR TEETH THE MORNING OF SURGERY WITH YOUR REGULAR TOOTHPASTE ? ? Take these medicines the morning of surgery with A SIP OF WATER: Inhalers, Oxycodone, Propranolol.  ? ?DO NOT TAKE ANY ORAL DIABETIC MEDICATIONS DAY OF YOUR SURGERY ? ?How to Manage Your Diabetes ?Before and After Surgery ? ?Why is it important to control my  blood sugar before and after surgery? ?Improving blood sugar levels before and after surgery helps healing and can limit problems. ?A way of improving blood sugar control is eating a healthy diet by: ? Eating less sugar and carbohydrates ? Increasing activity/exercise ? Talking with your doctor about reaching your blood sugar goals ?High blood sugars (greater than 180 mg/dL) can raise your risk of infections and slow your recovery, so you will  need to focus on controlling your diabetes during the weeks before surgery. ?Make sure that the doctor who takes care of your diabetes knows about your planned surgery including the date and location. ? ?How do I manage my blood sugar before surgery? ?Check your blood sugar at least 4 times a day, starting 2 days before surgery, to make sure that the level is not too high or low. ?Check your blood sugar the morning of your surgery when you wake up and every 2 hours until you get to the Short Stay unit. ?If your blood sugar is less than 70 mg/dL, you will need to treat for low blood sugar: ?Do not take insulin. ?Treat a low blood sugar (less than 70 mg/dL) with ? cup of clear juice (cranberry or apple), 4 glucose tablets, OR glucose gel. ?Recheck blood sugar in 15 minutes after treatment (to make sure it is greater than 70 mg/dL). If your blood sugar is not greater than 70 mg/dL on recheck, call [phone] for further instructions. ?Report your blood sugar to the short stay nurse when you get to Short Stay. ? ?If you are admitted to the hospital after surgery: ?Your blood sugar will be checked by the staff and you will probably be given insulin after surgery (instead of oral diabetes medicines) to make sure you have good blood sugar levels. ?The goal for blood sugar control after surgery is 80-180 mg/dL. ? ? ?WHAT DO I DO ABOUT MY DIABETES MEDICATION? ? ?Do not take oral diabetes medicines (pills) the morning of surgery. ? ?THE DAY BEFORE SURGERY, take Metformin as  prescribed.     ? ? ?THE MORNING OF SURGERY, do not take Metformin. ? ?Reviewed and Endorsed by [facility] Patient Education Committee, August 2015  ?                  ?           You may not have any metal on your body including hair pins, jewelry, and body piercing ? ?           Do not wear make-up, lotions, powders, perfumes, or deodorant ? ?Do not wear nail polish including gel and S&S, artificial/acrylic nails, or any other type of covering on natural nails including finger and toenails. If you have artificial nails, gel coating, etc. that needs to be removed by a nail salon please have this removed prior to surgery or surgery may need to be canceled/ delayed if the surgeon/ anesthesia feels like they are unable to be safely monitored.  ? ?Do not shave  48 hours prior to surgery.  ? ? Do not bring valuables to the hospital. [place] NOT ?            RESPONSIBLE   FOR VALUABLES. ? ? Contacts, dentures or bridgework may not be worn into surgery. ?  ? Patients discharged on the day of surgery will not be allowed to drive home.  Someone NEEDS to stay with you for the first 24 hours after anesthesia. ? ?            Please read over the following fact sheets you were given: IF [place] [phone]- Apolonio Schneiders ? ?   [place] - Preparing for Surgery ?Before surgery, you can play an important role.  Because skin is not sterile, your skin needs to be as free of germs as possible.  You can reduce the number of  germs on your skin by washing with CHG (chlorahexidine gluconate) soap before surgery.  CHG is an antiseptic cleaner which kills germs and bonds with the skin to continue killing germs even after washing. ?Please DO NOT use if you have an allergy to CHG or antibacterial soaps.  If your skin becomes reddened/irritated stop using the CHG and inform your nurse when you arrive at Short Stay. ?Do not shave (including legs and underarms) for at least 48 hours  prior to the first CHG shower.  You may shave your face/neck. ? ?Please follow these instructions carefully: ? 1.  Shower with CHG Soap the night before surgery and the  morning of surgery. ? 2.  If you choose to wash your hair, wash your hair first as usual with your normal  shampoo. ? 3.  After you shampoo, rinse your hair and body thoroughly to remove the shampoo.                            ? 4.  Use CHG as you would any other liquid soap.  You can apply chg directly to the skin and wash.  Gently with a scrungie or clean washcloth. ? 5.  Apply the CHG Soap to your body ONLY FROM THE NECK DOWN.   Do   not use on face/ open      ?                     Wound or open sores. Avoid contact with eyes, ears mouth and   genitals (private parts).  ?                     Production manager,  Genitals (private parts) with your normal soap. ?            6.  Wash thoroughly, paying special attention to the area where your    surgery  will be performed. ? 7.  Thoroughly rinse your body with warm water from the neck down. ? 8.  DO NOT shower/wash with your normal soap after using and rinsing off the CHG Soap. ?               9.  Pat yourself dry with a clean towel. ?           10.  Wear clean pajamas. ?           11.  Place clean sheets on your bed the night of your first shower and do not  sleep with pets. ?Day of Surgery : ?Do not apply any lotions/deodorants the morning of surgery.  Please wear clean clothes to the hospital/surgery center. ? ?FAILURE TO FOLLOW THESE INSTRUCTIONS MAY RESULT IN THE CANCELLATION OF YOUR SURGERY ? ?PATIENT SIGNATURE_________________________________ ? ?NURSE SIGNATURE__________________________________ ? ?________________________________________________________________________  ? ?Incentive Spirometer ? ?An incentive spirometer is a tool that can help keep your lungs clear and active. This tool measures how well you are filling your lungs with each breath. Taking long deep breaths may help reverse or decrease  the chance of developing breathing (pulmonary) problems (especially infection) following: ?A long period of time when you are unable to move or be active. ?BEFORE THE PROCEDURE  ?If the spirometer includes an indi

## 2021-05-17 ENCOUNTER — Encounter (HOSPITAL_COMMUNITY)
Admission: RE | Admit: 2021-05-17 | Discharge: 2021-05-17 | Disposition: A | Discharge status: Home / Self Care | Payer: Medicaid Other | Source: Ambulatory Visit | Attending: Gynecologic Oncology | Admitting: Gynecologic Oncology

## 2021-05-17 ENCOUNTER — Encounter (HOSPITAL_COMMUNITY): Payer: Self-pay

## 2021-05-17 VITALS — BP 128/91 | HR 94 | Temp 98.7°F | Resp 12 | Ht 66.0 in | Wt 280.0 lb

## 2021-05-17 DIAGNOSIS — Z01812 Encounter for preprocedural laboratory examination: Secondary | ICD-10-CM | POA: Diagnosis present

## 2021-05-17 DIAGNOSIS — E119 Type 2 diabetes mellitus without complications: Secondary | ICD-10-CM | POA: Insufficient documentation

## 2021-05-17 DIAGNOSIS — Z01818 Encounter for other preprocedural examination: Secondary | ICD-10-CM

## 2021-05-17 DIAGNOSIS — C579 Malignant neoplasm of female genital organ, unspecified: Secondary | ICD-10-CM | POA: Insufficient documentation

## 2021-05-17 LAB — COMPREHENSIVE METABOLIC PANEL
ALT: 39 U/L (ref 0–44)
AST: 32 U/L (ref 15–41)
Albumin: 3.9 g/dL (ref 3.5–5.0)
Alkaline Phosphatase: 141 U/L — ABNORMAL HIGH (ref 38–126)
Anion gap: 6 (ref 5–15)
BUN: 11 mg/dL (ref 6–20)
CO2: 26 mmol/L (ref 22–32)
Calcium: 9.4 mg/dL (ref 8.9–10.3)
Chloride: 107 mmol/L (ref 98–111)
Creatinine, Ser: 0.63 mg/dL (ref 0.44–1.00)
GFR, Estimated: >60 mL/min (ref >60)
Glucose, Bld: 119 mg/dL — ABNORMAL HIGH (ref 70–99)
Potassium: 3.7 mmol/L (ref 3.5–5.1)
Sodium: 139 mmol/L (ref 135–145)
Total Bilirubin: 0.3 mg/dL (ref 0.3–1.2)
Total Protein: 7.7 g/dL (ref 6.5–8.1)

## 2021-05-17 LAB — CBC
HCT: 41.9 % (ref 36.0–46.0)
Hemoglobin: 14 g/dL (ref 12.0–15.0)
MCH: 27.1 pg (ref 26.0–34.0)
MCHC: 33.4 g/dL (ref 30.0–36.0)
MCV: 81.2 fL (ref 80.0–100.0)
Platelets: 259 10*3/uL (ref 150–400)
RBC: 5.16 MIL/uL — ABNORMAL HIGH (ref 3.87–5.11)
RDW: 19.7 % — ABNORMAL HIGH (ref 11.5–15.5)
WBC: 8.4 10*3/uL (ref 4.0–10.5)
nRBC: 0 % (ref 0.0–0.2)

## 2021-05-17 LAB — GLUCOSE, CAPILLARY: Glucose-Capillary: 136 mg/dL — ABNORMAL HIGH (ref 70–99)

## 2021-05-17 LAB — PREGNANCY, URINE: Preg Test, Ur: NEGATIVE

## 2021-05-17 LAB — HEMOGLOBIN A1C
Hgb A1c MFr Bld: 5.8 % — ABNORMAL HIGH (ref 4.8–5.6)
Mean Plasma Glucose: 119.76 mg/dL

## 2021-05-20 ENCOUNTER — Telehealth: Payer: Self-pay | Admitting: *Deleted

## 2021-05-20 ENCOUNTER — Encounter (HOSPITAL_COMMUNITY): Payer: Self-pay | Admitting: Gynecologic Oncology

## 2021-05-20 NOTE — Telephone Encounter (Signed)
Telephone call to check on pre-operative status.  Patient compliant with pre-operative instructions.  Reinforced nothing to eat after midnight. Clear liquids until 7am and drink the electrolyte drink then as well. CHG bath tonight and in the morning. Patient to arrive at [facility].   ?No questions or concerns voiced.  Instructed to call for any needs.  ?

## 2021-05-20 NOTE — Telephone Encounter (Signed)
Attempted to reach pt to review pre surgical information. Unable to reach pt or LVM.  ?

## 2021-05-21 ENCOUNTER — Other Ambulatory Visit: Payer: Self-pay

## 2021-05-21 ENCOUNTER — Ambulatory Visit (HOSPITAL_COMMUNITY)
Admission: RE | Admit: 2021-05-21 | Discharge: 2021-05-21 | Disposition: A | Discharge status: Home / Self Care | Payer: Medicaid Other | Source: Ambulatory Visit | Attending: Gynecologic Oncology | Admitting: Gynecologic Oncology

## 2021-05-21 ENCOUNTER — Encounter (HOSPITAL_COMMUNITY): Payer: Self-pay | Admitting: Gynecologic Oncology

## 2021-05-21 ENCOUNTER — Encounter (HOSPITAL_COMMUNITY)
Admission: RE | Disposition: A | Discharge status: Home / Self Care | Payer: Self-pay | Source: Ambulatory Visit | Attending: Gynecologic Oncology

## 2021-05-21 ENCOUNTER — Ambulatory Visit (HOSPITAL_BASED_OUTPATIENT_CLINIC_OR_DEPARTMENT_OTHER): Payer: Medicaid Other | Admitting: Anesthesiology

## 2021-05-21 ENCOUNTER — Ambulatory Visit (HOSPITAL_COMMUNITY): Payer: Medicaid Other | Admitting: Anesthesiology

## 2021-05-21 DIAGNOSIS — Z8589 Personal history of malignant neoplasm of other organs and systems: Secondary | ICD-10-CM | POA: Diagnosis not present

## 2021-05-21 DIAGNOSIS — Z9221 Personal history of antineoplastic chemotherapy: Secondary | ICD-10-CM | POA: Diagnosis not present

## 2021-05-21 DIAGNOSIS — E119 Type 2 diabetes mellitus without complications: Secondary | ICD-10-CM

## 2021-05-21 DIAGNOSIS — C801 Malignant (primary) neoplasm, unspecified: Secondary | ICD-10-CM | POA: Insufficient documentation

## 2021-05-21 DIAGNOSIS — C579 Malignant neoplasm of female genital organ, unspecified: Secondary | ICD-10-CM

## 2021-05-21 DIAGNOSIS — Z7984 Long term (current) use of oral hypoglycemic drugs: Secondary | ICD-10-CM | POA: Insufficient documentation

## 2021-05-21 DIAGNOSIS — Z923 Personal history of irradiation: Secondary | ICD-10-CM | POA: Diagnosis not present

## 2021-05-21 DIAGNOSIS — F418 Other specified anxiety disorders: Secondary | ICD-10-CM

## 2021-05-21 DIAGNOSIS — Z01818 Encounter for other preprocedural examination: Secondary | ICD-10-CM

## 2021-05-21 DIAGNOSIS — Z90721 Acquired absence of ovaries, unilateral: Secondary | ICD-10-CM | POA: Insufficient documentation

## 2021-05-21 DIAGNOSIS — J45909 Unspecified asthma, uncomplicated: Secondary | ICD-10-CM | POA: Insufficient documentation

## 2021-05-21 DIAGNOSIS — Z6841 Body Mass Index (BMI) 40.0 and over, adult: Secondary | ICD-10-CM

## 2021-05-21 HISTORY — PX: ROBOTIC ASSISTED TOTAL HYSTERECTOMY: SHX6085

## 2021-05-21 HISTORY — PX: XI ROBOTIC ASSISTED OOPHORECTOMY: SHX6823

## 2021-05-21 LAB — TYPE AND SCREEN
ABO/RH(D): A POS
Antibody Screen: POSITIVE
PT AG Type: POSITIVE
Unit division: 0
Unit division: 0

## 2021-05-21 LAB — BPAM RBC
Blood Product Expiration Date: 202305142359
Blood Product Expiration Date: 202306032359
Unit Type and Rh: 6200
Unit Type and Rh: 6200

## 2021-05-21 LAB — GLUCOSE, CAPILLARY
Glucose-Capillary: 116 mg/dL — ABNORMAL HIGH (ref 70–99)
Glucose-Capillary: 147 mg/dL — ABNORMAL HIGH (ref 70–99)

## 2021-05-21 SURGERY — HYSTERECTOMY, TOTAL, ROBOT-ASSISTED
Anesthesia: General | Site: Abdomen

## 2021-05-21 MED ORDER — CELECOXIB 200 MG PO CAPS
200.0000 mg | ORAL_CAPSULE | ORAL | Status: AC
  Administered 2021-05-21: 200 mg via ORAL
  Filled 2021-05-21: qty 1

## 2021-05-21 MED ORDER — HYDROMORPHONE HCL 1 MG/ML IJ SOLN
0.2500 mg | INTRAMUSCULAR | Status: DC | PRN
  Administered 2021-05-21: 0.5 mg via INTRAVENOUS

## 2021-05-21 MED ORDER — MIDAZOLAM HCL 5 MG/5ML IJ SOLN
INTRAMUSCULAR | Status: DC | PRN
  Administered 2021-05-21: 2 mg via INTRAVENOUS

## 2021-05-21 MED ORDER — LACTATED RINGERS IR SOLN
Status: DC | PRN
  Administered 2021-05-21: 1000 mL

## 2021-05-21 MED ORDER — KETAMINE HCL 10 MG/ML IJ SOLN
INTRAMUSCULAR | Status: AC
  Filled 2021-05-21: qty 1

## 2021-05-21 MED ORDER — LIDOCAINE 2% (20 MG/ML) 5 ML SYRINGE
INTRAMUSCULAR | Status: DC | PRN
  Administered 2021-05-21: 80 mg via INTRAVENOUS

## 2021-05-21 MED ORDER — SUGAMMADEX SODIUM 500 MG/5ML IV SOLN
INTRAVENOUS | Status: AC
  Filled 2021-05-21: qty 10

## 2021-05-21 MED ORDER — CHLORHEXIDINE GLUCONATE 0.12 % MT SOLN
15.0000 mL | Freq: Once | OROMUCOSAL | Status: AC
  Administered 2021-05-21: 15 mL via OROMUCOSAL

## 2021-05-21 MED ORDER — ROCURONIUM BROMIDE 10 MG/ML (PF) SYRINGE
PREFILLED_SYRINGE | INTRAVENOUS | Status: DC | PRN
  Administered 2021-05-21: 80 mg via INTRAVENOUS
  Administered 2021-05-21 (×2): 20 mg via INTRAVENOUS
  Administered 2021-05-21: 10 mg via INTRAVENOUS

## 2021-05-21 MED ORDER — SCOPOLAMINE 1 MG/3DAYS TD PT72
1.0000 | MEDICATED_PATCH | TRANSDERMAL | Status: DC
  Administered 2021-05-21: 1.5 mg via TRANSDERMAL
  Filled 2021-05-21: qty 1

## 2021-05-21 MED ORDER — ALBUTEROL SULFATE HFA 108 (90 BASE) MCG/ACT IN AERS
INHALATION_SPRAY | RESPIRATORY_TRACT | Status: AC
  Filled 2021-05-21: qty 6.7

## 2021-05-21 MED ORDER — GABAPENTIN 300 MG PO CAPS
300.0000 mg | ORAL_CAPSULE | ORAL | Status: AC
  Administered 2021-05-21: 300 mg via ORAL
  Filled 2021-05-21: qty 1

## 2021-05-21 MED ORDER — ALBUTEROL SULFATE HFA 108 (90 BASE) MCG/ACT IN AERS
INHALATION_SPRAY | RESPIRATORY_TRACT | Status: DC | PRN
  Administered 2021-05-21 (×3): 8 via RESPIRATORY_TRACT

## 2021-05-21 MED ORDER — MIDAZOLAM HCL 2 MG/2ML IJ SOLN
INTRAMUSCULAR | Status: AC
  Filled 2021-05-21: qty 2

## 2021-05-21 MED ORDER — ACETAMINOPHEN 500 MG PO TABS
1000.0000 mg | ORAL_TABLET | ORAL | Status: AC
  Administered 2021-05-21: 1000 mg via ORAL
  Filled 2021-05-21: qty 2

## 2021-05-21 MED ORDER — KETAMINE HCL 10 MG/ML IJ SOLN
INTRAMUSCULAR | Status: DC | PRN
  Administered 2021-05-21 (×2): 10 mg via INTRAVENOUS
  Administered 2021-05-21: 30 mg via INTRAVENOUS

## 2021-05-21 MED ORDER — DEXAMETHASONE SODIUM PHOSPHATE 4 MG/ML IJ SOLN: 4.0000 mg | INTRAMUSCULAR | Status: DC

## 2021-05-21 MED ORDER — DEXMEDETOMIDINE (PRECEDEX) IN NS 20 MCG/5ML (4 MCG/ML) IV SYRINGE
PREFILLED_SYRINGE | INTRAVENOUS | Status: DC | PRN
  Administered 2021-05-21 (×2): 8 ug via INTRAVENOUS

## 2021-05-21 MED ORDER — BUPIVACAINE HCL 0.25 % IJ SOLN
INTRAMUSCULAR | Status: DC | PRN
  Administered 2021-05-21: 35 mL

## 2021-05-21 MED ORDER — DEXMEDETOMIDINE (PRECEDEX) IN NS 20 MCG/5ML (4 MCG/ML) IV SYRINGE
PREFILLED_SYRINGE | INTRAVENOUS | Status: AC
  Filled 2021-05-21: qty 5

## 2021-05-21 MED ORDER — PROMETHAZINE HCL 25 MG/ML IJ SOLN: 6.2500 mg | INTRAMUSCULAR | Status: DC | PRN

## 2021-05-21 MED ORDER — AMISULPRIDE (ANTIEMETIC) 5 MG/2ML IV SOLN: 10.0000 mg | Freq: Once | INTRAVENOUS | Status: DC | PRN

## 2021-05-21 MED ORDER — FENTANYL CITRATE (PF) 100 MCG/2ML IJ SOLN
INTRAMUSCULAR | Status: DC | PRN
  Administered 2021-05-21 (×2): 50 ug via INTRAVENOUS
  Administered 2021-05-21: 150 ug via INTRAVENOUS

## 2021-05-21 MED ORDER — HEPARIN SODIUM (PORCINE) 5000 UNIT/ML IJ SOLN
5000.0000 [IU] | INTRAMUSCULAR | Status: AC
  Administered 2021-05-21: 5000 [IU] via SUBCUTANEOUS
  Filled 2021-05-21: qty 1

## 2021-05-21 MED ORDER — SUGAMMADEX SODIUM 500 MG/5ML IV SOLN
INTRAVENOUS | Status: DC | PRN
  Administered 2021-05-21: 300 mg via INTRAVENOUS

## 2021-05-21 MED ORDER — HYDROMORPHONE HCL 1 MG/ML IJ SOLN
INTRAMUSCULAR | Status: AC
  Administered 2021-05-21: 0.5 mg via INTRAVENOUS
  Filled 2021-05-21: qty 1

## 2021-05-21 MED ORDER — STERILE WATER FOR IRRIGATION IR SOLN
Status: DC | PRN
  Administered 2021-05-21: 1000 mL

## 2021-05-21 MED ORDER — OXYCODONE HCL 5 MG PO TABS
ORAL_TABLET | ORAL | Status: DC
Start: 2021-05-21 — End: 2021-05-21
  Filled 2021-05-21: qty 1

## 2021-05-21 MED ORDER — PROPOFOL 10 MG/ML IV BOLUS
INTRAVENOUS | Status: AC
  Filled 2021-05-21: qty 20

## 2021-05-21 MED ORDER — PHENYLEPHRINE 80 MCG/ML (10ML) SYRINGE FOR IV PUSH (FOR BLOOD PRESSURE SUPPORT)
PREFILLED_SYRINGE | INTRAVENOUS | Status: DC | PRN
  Administered 2021-05-21: 80 ug via INTRAVENOUS
  Administered 2021-05-21: 40 ug via INTRAVENOUS
  Administered 2021-05-21 (×3): 80 ug via INTRAVENOUS
  Administered 2021-05-21: 40 ug via INTRAVENOUS
  Administered 2021-05-21 (×2): 80 ug via INTRAVENOUS

## 2021-05-21 MED ORDER — OXYCODONE HCL 5 MG/5ML PO SOLN: 5.0000 mg | Freq: Once | ORAL | Status: AC | PRN

## 2021-05-21 MED ORDER — LACTATED RINGERS IV SOLN: INTRAVENOUS | Status: DC

## 2021-05-21 MED ORDER — ORAL CARE MOUTH RINSE: 15.0000 mL | Freq: Once | OROMUCOSAL | Status: AC

## 2021-05-21 MED ORDER — PROPOFOL 10 MG/ML IV BOLUS
INTRAVENOUS | Status: DC | PRN
  Administered 2021-05-21: 200 mg via INTRAVENOUS

## 2021-05-21 MED ORDER — LACTATED RINGERS IV SOLN: INTRAVENOUS | Status: DC | PRN

## 2021-05-21 MED ORDER — FENTANYL CITRATE (PF) 250 MCG/5ML IJ SOLN
INTRAMUSCULAR | Status: AC
  Filled 2021-05-21: qty 5

## 2021-05-21 MED ORDER — HYDROMORPHONE HCL 1 MG/ML IJ SOLN
INTRAMUSCULAR | Status: AC
  Filled 2021-05-21: qty 1

## 2021-05-21 MED ORDER — OXYCODONE HCL 5 MG PO TABS
5.0000 mg | ORAL_TABLET | Freq: Once | ORAL | Status: AC | PRN
  Administered 2021-05-21: 5 mg via ORAL

## 2021-05-21 MED ORDER — CEFAZOLIN IN SODIUM CHLORIDE 3-0.9 GM/100ML-% IV SOLN
3.0000 g | INTRAVENOUS | Status: AC
  Administered 2021-05-21: 3 g via INTRAVENOUS
  Filled 2021-05-21: qty 100

## 2021-05-21 MED ORDER — MEPERIDINE HCL 50 MG/ML IJ SOLN: 6.2500 mg | INTRAMUSCULAR | Status: DC | PRN

## 2021-05-21 MED ORDER — ONDANSETRON HCL 4 MG/2ML IJ SOLN
INTRAMUSCULAR | Status: DC | PRN
  Administered 2021-05-21: 4 mg via INTRAVENOUS

## 2021-05-21 SURGICAL SUPPLY — 78 items
APPLICATOR SURGIFLO ENDO (HEMOSTASIS) IMPLANT
BACTOSHIELD CHG 4% 4OZ (MISCELLANEOUS) ×1
BAG LAPAROSCOPIC 12 15 PORT 16 (BASKET) IMPLANT
BAG RETRIEVAL 10 (BASKET) ×3
BAG RETRIEVAL 12/15 (BASKET) ×3
BLADE SURG SZ10 CARB STEEL (BLADE) IMPLANT
COVER BACK TABLE 60X90IN (DRAPES) ×3 IMPLANT
COVER TIP SHEARS 8 DVNC (MISCELLANEOUS) ×2 IMPLANT
COVER TIP SHEARS 8MM DA VINCI (MISCELLANEOUS) ×1
DERMABOND ADVANCED (GAUZE/BANDAGES/DRESSINGS) ×1
DERMABOND ADVANCED .7 DNX12 (GAUZE/BANDAGES/DRESSINGS) ×2 IMPLANT
DRAPE ARM DVNC X/XI (DISPOSABLE) ×8 IMPLANT
DRAPE COLUMN DVNC XI (DISPOSABLE) ×2 IMPLANT
DRAPE DA VINCI XI ARM (DISPOSABLE) ×4
DRAPE DA VINCI XI COLUMN (DISPOSABLE) ×1
DRAPE SHEET LG 3/4 BI-LAMINATE (DRAPES) ×3 IMPLANT
DRAPE SURG IRRIG POUCH 19X23 (DRAPES) ×3 IMPLANT
DRSG OPSITE POSTOP 4X6 (GAUZE/BANDAGES/DRESSINGS) IMPLANT
DRSG OPSITE POSTOP 4X8 (GAUZE/BANDAGES/DRESSINGS) IMPLANT
ELECT PENCIL ROCKER SW 15FT (MISCELLANEOUS) IMPLANT
ELECT REM PT RETURN 15FT ADLT (MISCELLANEOUS) ×3 IMPLANT
GAUZE 4X4 16PLY ~~LOC~~+RFID DBL (SPONGE) ×3 IMPLANT
GLOVE BIO SURGEON STRL SZ 6 (GLOVE) ×12 IMPLANT
GLOVE BIO SURGEON STRL SZ 6.5 (GLOVE) ×6 IMPLANT
GOWN STRL REUS W/ TWL LRG LVL3 (GOWN DISPOSABLE) ×8 IMPLANT
GOWN STRL REUS W/TWL LRG LVL3 (GOWN DISPOSABLE) ×4
HOLDER FOLEY CATH W/STRAP (MISCELLANEOUS) IMPLANT
IRRIG SUCT STRYKERFLOW 2 WTIP (MISCELLANEOUS) ×3
IRRIGATION SUCT STRKRFLW 2 WTP (MISCELLANEOUS) ×2 IMPLANT
KIT PROCEDURE DA VINCI SI (MISCELLANEOUS)
KIT PROCEDURE DVNC SI (MISCELLANEOUS) IMPLANT
KIT TURNOVER KIT A (KITS) IMPLANT
LIGASURE IMPACT 36 18CM CVD LR (INSTRUMENTS) IMPLANT
MANIPULATOR ADVINCU DEL 3.0 PL (MISCELLANEOUS) IMPLANT
MANIPULATOR ADVINCU DEL 3.5 PL (MISCELLANEOUS) IMPLANT
MANIPULATOR UTERINE 4.5 ZUMI (MISCELLANEOUS) IMPLANT
NDL HYPO 21X1.5 SAFETY (NEEDLE) ×2 IMPLANT
NDL SPNL 18GX3.5 QUINCKE PK (NEEDLE) IMPLANT
NEEDLE HYPO 21X1.5 SAFETY (NEEDLE) ×3 IMPLANT
NEEDLE SPNL 18GX3.5 QUINCKE PK (NEEDLE) IMPLANT
OBTURATOR OPTICAL STANDARD 8MM (TROCAR) ×1
OBTURATOR OPTICAL STND 8 DVNC (TROCAR) ×2
OBTURATOR OPTICALSTD 8 DVNC (TROCAR) ×2 IMPLANT
PACK ROBOT GYN CUSTOM WL (TRAY / TRAY PROCEDURE) ×3 IMPLANT
PAD POSITIONING PINK XL (MISCELLANEOUS) ×3 IMPLANT
PORT ACCESS TROCAR AIRSEAL 12 (TROCAR) ×2 IMPLANT
PORT ACCESS TROCAR AIRSEAL 5M (TROCAR) ×1
RETRACTOR LAPSCP 12X46 CVD (ENDOMECHANICALS) IMPLANT
RTRCTR LAPSCP 12X46 CVD (ENDOMECHANICALS) ×3
SCRUB CHG 4% DYNA-HEX 4OZ (MISCELLANEOUS) ×2 IMPLANT
SEAL CANN UNIV 5-8 DVNC XI (MISCELLANEOUS) ×8 IMPLANT
SEAL XI 5MM-8MM UNIVERSAL (MISCELLANEOUS) ×4
SET TRI-LUMEN FLTR TB AIRSEAL (TUBING) ×3 IMPLANT
SPIKE FLUID TRANSFER (MISCELLANEOUS) ×3 IMPLANT
SPONGE T-LAP 18X18 ~~LOC~~+RFID (SPONGE) IMPLANT
SURGIFLO W/THROMBIN 8M KIT (HEMOSTASIS) IMPLANT
SUT MNCRL AB 4-0 PS2 18 (SUTURE) IMPLANT
SUT PDS AB 1 TP1 96 (SUTURE) IMPLANT
SUT VIC AB 0 CT1 27 (SUTURE)
SUT VIC AB 0 CT1 27XBRD ANTBC (SUTURE) IMPLANT
SUT VIC AB 2-0 CT1 27 (SUTURE)
SUT VIC AB 2-0 CT1 TAPERPNT 27 (SUTURE) IMPLANT
SUT VIC AB 4-0 PS2 18 (SUTURE) ×6 IMPLANT
SUT VLOC 180 0 9IN  GS21 (SUTURE) ×1
SUT VLOC 180 0 9IN GS21 (SUTURE) IMPLANT
SYR 10ML LL (SYRINGE) IMPLANT
SYR 50ML LL SCALE MARK (SYRINGE) ×1 IMPLANT
SYS BAG RETRIEVAL 10MM (BASKET) ×6
SYS WOUND ALEXIS 18CM MED (MISCELLANEOUS)
SYSTEM BAG RETRIEVAL 10MM (BASKET) ×2 IMPLANT
SYSTEM WOUND ALEXIS 18CM MED (MISCELLANEOUS) IMPLANT
TOWEL OR NON WOVEN STRL DISP B (DISPOSABLE) IMPLANT
TRAP SPECIMEN MUCUS 40CC (MISCELLANEOUS) IMPLANT
TRAY FOLEY MTR SLVR 16FR STAT (SET/KITS/TRAYS/PACK) ×3 IMPLANT
TROCAR XCEL NON-BLD 5MMX100MML (ENDOMECHANICALS) IMPLANT
UNDERPAD 30X36 HEAVY ABSORB (UNDERPADS AND DIAPERS) ×6 IMPLANT
WATER STERILE IRR 1000ML POUR (IV SOLUTION) ×3 IMPLANT
YANKAUER SUCT BULB TIP 10FT TU (MISCELLANEOUS) IMPLANT

## 2021-05-21 NOTE — Discharge Instructions (Signed)

## 2021-05-21 NOTE — Anesthesia Procedure Notes (Addendum)
Procedure Name: Intubation ?Date/Time: 05/21/2021 9:00 AM ?Performed by: Lavina Hamman, CRNA ?Pre-anesthesia Checklist: Patient identified, Emergency Drugs available, Suction available and Patient being monitored ?Patient Re-evaluated:Patient Re-evaluated prior to induction ?Oxygen Delivery Method: Circle System Utilized ?Preoxygenation: Pre-oxygenation with 100% oxygen ?Induction Type: IV induction ?Ventilation: Mask ventilation without difficulty ?Laryngoscope Size: Mac and 3 ?Grade View: Grade I ?Tube type: Oral ?Number of attempts: 1 ?Airway Equipment and Method: Stylet and Oral airway ?Placement Confirmation: ETT inserted through vocal cords under direct vision, positive ETCO2 and breath sounds checked- equal and bilateral ?Secured at: 22 cm ?Tube secured with: Tape ?Dental Injury: Teeth and Oropharynx as per pre-operative assessment  ?Comments: ATOI by [employer] student, taped att 22 but pulled back to 21cm with Dr Sabra Heck and Elgie Congo.   ?Breath sounds bilateral with wheezing.  Albuterol given.   ? ? ? ? ?

## 2021-05-21 NOTE — Transfer of Care (Signed)
Immediate Anesthesia Transfer of Care Note ? ?Patient: Alyssa Becker ? ?Procedure(s) Performed: XI ROBOTIC ASSISTED TOTAL HYSTERECTOMY (Abdomen) ?XI ROBOTIC ASSISTED LEFT OOPHORECTOMY (Left: Abdomen) ? ?Patient Location: PACU ? ?Anesthesia Type:General ? ?Level of Consciousness: awake and drowsy ? ?Airway & Oxygen Therapy: Patient Spontanous Breathing and Patient connected to face mask oxygen ? ?Post-op Assessment: Report given to RN and Post -op Vital signs reviewed and stable ? ?Post vital signs: Reviewed and stable ? ?Last Vitals:  ?Vitals Value Taken Time  ?BP 147/84 05/21/21 1204  ?Temp    ?Pulse 114 05/21/21 1208  ?Resp 20 05/21/21 1208  ?SpO2 96 % 05/21/21 1208  ?Vitals shown include unvalidated device data. ? ?Last Pain:  ?Vitals:  ? 05/21/21 0801  ?TempSrc: Oral  ?   ? ?  ? ?Complications: No notable events documented. ?

## 2021-05-21 NOTE — Op Note (Addendum)
OPERATIVE NOTE ? 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 ?Post-operative Diagnosis: same ? ?Operation: Robotic-assisted modified radical hysterectomy with left oophorectomy, cystoscopy ?Modifier 22: Extreme morbid obesity requiring additional OR personnel for positioning and retraction. Obesity made retroperitoneal visualization limited and increased the complexity of the case and necessitated additional instrumentation for retraction. Obesity related complexity increased the duration of the procedure by 45 minutes.  ? ?Surgeon: Jeral Pinch MD ? ?Assistant Surgeon: Lahoma Crocker MD (an MD assistant was necessary for tissue manipulation, management of robotic instrumentation, retraction and positioning due to the complexity of the case and hospital policies).  ? ?Anesthesia: GET ? ?Urine Output: 250 cc ? ?Operative Findings:  On EUA, some radiation changes noted with deviation of the cervix posteriorly. On intra-abdominal entry, normal upper abdominal survey. Normal omentum, small and large bowel. Surgically absent right adnexa. Left ovary pexed along sidewall above the pelvic brim (normal in appearance). Uterus 8-10 cm and somewhat bulbous. Significant retroperitoneal edema, some fibrosis. 2-3 cm tumor implant along the right parametrium and distal uterosacral ligament, free from the cervix itself. No obvious paracervical tumor extension. No gross adenopathy. No ascites.  ?On cystoscopy, bladder intact, good efflux noted from bilateral ureteral orifices.  ? ?Estimated Blood Loss:  100 cc     ? ?Total IV Fluids: see I&O flowsheet ?        ?Specimens: uterus, cervix, right parametrium and uterosacral ligament, left ovary ?        ?Complications:  None apparent; patient tolerated the procedure well. ?        ?Disposition: PACU - hemodynamically stable. ? ?Procedure Details  ?The  patient was seen in the Holding Room. The risks, benefits, complications, treatment options, and expected outcomes were discussed with the patient.  The patient concurred with the proposed plan, giving informed consent.  The site of surgery properly noted/marked. The patient was identified as Alyssa Becker and the procedure verified as a Robotic-assisted hysterectomy with unilateral oophorectomy.  ? ?After induction of anesthesia, the patient was draped and prepped in the usual sterile manner. Patient was placed in supine position after anesthesia and draped and prepped in the usual sterile manner as follows: Her arms were tucked to her side with all appropriate precautions.  The shoulders were stabilized with padded shoulder blocks applied to the acromium processes.  The patient was placed in the semi-lithotomy position in [place].  The perineum and vagina were prepped with CholoraPrep. The patient was draped after the CholoraPrep had been allowed to dry for 3 minutes.  A Time Out was held and the above information confirmed. ? ?The urethra was prepped with Betadine. Foley catheter was placed.  An EEA sizer was placed within the vagina. OG tube placement was confirmed and to suction.  ? ?Next, a 10 mm skin incision was made 1 cm below the subcostal margin in the midclavicular line.  The 5 mm Optiview port and scope was used for direct entry.  Opening pressure was under 10 mm CO2.  The abdomen was insufflated and the findings were noted as above.   At this point and all points during the procedure, the patient's intra-abdominal pressure did not exceed 15 mmHg. Next, an 8 mm skin incision was made superior to the umbilicus and a right and left port were placed about 8 cm lateral to the robot port on the right and left side.  A fourth arm was placed on  the right.  The 5 mm assist trocar was exchanged for a 10-12 mm port. All ports were placed under direct visualization.  The patient was placed in steep  Trendelenburg.  The robot was docked in the normal manner. ? ?Attention was first turned to the left.  Monopolar electrocautery was used to free the left ovary from the left pelvic sidewall and surrounding clips.  The ovary was elevated on the infundibulopelvic ligament and bipolar electrocautery was used to cauterize the ligament and ovarian vasculature, after the left ureter had been seen along the medial leaf of the broad ligament.  After it was detached, the left ovary was placed in an Endo Catch bag. ? ?At this time, patient became increasingly difficult to ventilate and had saturations as low as the 70s.  1 robotic instrument was removed and the patient was flattened.  After saturations returned to the 90s, the patient was slowly placed in Trendelenburg position again. ? ?The right and left peritoneum were opened parallel to the IP ligament to open the retroperitoneal spaces bilaterally. The round ligaments were transected. The ureter was again noted to be on the medial leaf of the broad ligament.   ? ?The posterior peritoneum was taken down to the level of the EEA sizer on the left.  Some sigmoid epiploica were adherent to the right pelvic side wall and over parametrial mass. A combination of sharp dissection and short bursts of electrocautery were used to lyse adhesions of the epiploica to the sidewall, freeing the colon. On the right, given what appeared to be tumor along the right parametria, the right ureter was again identified and the posterior peritoneum was taken down inferior to the mass within the parametria.  The mass was elevated and a combination of blunt dissection, sharp dissection, and monopolar electrocautery were used to dissect the tumor free from the surrounding parametria, broad ligament, and cervix.  The mass itself did not appear to communicate with the cervix and seem to be a remnant from prior removal of her right ovary.  Additionally, there was some extension of the tumor onto the  right uterosacral ligament which was removed with the specimen.  This was all placed in an Endo Catch bag and removed through the vagina after her hysterectomy.   ? ?At this point, the EEA sizer was removed and a medium KOH ring was placed within the vagina and held in place by an assistance hand.  The anterior peritoneum was taken down.  The bladder flap was created to the level of the KOH ring.  The uterine artery on the right side was skeletonized, cauterized and cut in the normal manner.  A similar procedure was performed on the left.  The colpotomy was made and the uterus, cervix were amputated, placed in a 15 mm Endo Catch bag and delivered through the vagina.  Pedicles were inspected and excellent hemostasis was achieved.  Both the Endo Catch bag with the left ovary as well as the right parametrial tumor nodule were both handed out through the vagina. ? ?The colpotomy at the vaginal cuff was closed with 0 V-Lock.  A second layer was used to close the peritoneum and reinforce the cuff along the midportion with figure-of-eight stitches using 0 Vicryl on a CT1 needle.  Irrigation was used and excellent hemostasis was achieved.  At this point in the procedure was completed.  Robotic instruments were removed under direct visulaization.  The robot was undocked. The fascia at the 10-12 mm port was closed  with 0 Vicryl on a UR-5 needle.  The subcuticular tissue was closed with 4-0 Vicryl and the skin was closed with 4-0 Monocryl in a subcuticular manner.  Dermabond was applied.   ? ?Cystoscopy was then performed with findings as noted above.   ? ?The vagina was swabbed with minimal bleeding noted.   All sponge, lap and needle counts were correct x  3.  ? ?The patient was transferred to the recovery room in stable condition. ? ?Jeral Pinch, MD ? ?

## 2021-05-21 NOTE — Interval H&P Note (Signed)
History and Physical Interval Note: ? ?05/21/2021 ?7:32 AM ? ?Alyssa Becker  has presented today for surgery, with the diagnosis of GYN MALIGNANCY.  The various methods of treatment have been discussed with the patient and family. After consideration of risks, benefits and other options for treatment, the patient has consented to  Procedure(s): ?XI ROBOTIC ASSISTED TOTAL HYSTERECTOMY (N/A) ?XI ROBOTIC ASSISTED LEFT OOPHORECTOMY, POSSIBLE MINI LAPAROTOMY, POSSIBLE LAPAROTOMY (Left) as a surgical intervention.  The patient's history has been reviewed, patient examined, no change in status, stable for surgery.  I have reviewed the patient's chart and labs.  Questions were answered to the patient's satisfaction.   ? ? ?[name] ? ? ?

## 2021-05-21 NOTE — Anesthesia Postprocedure Evaluation (Signed)
Anesthesia Post Note ? ?Patient: Alyssa Becker ? ?Procedure(s) Performed: XI ROBOTIC ASSISTED TOTAL HYSTERECTOMY (Abdomen) ?XI ROBOTIC ASSISTED LEFT OOPHORECTOMY (Left: Abdomen) ? ?  ? ?Patient location during evaluation: PACU ?Anesthesia Type: General ?Level of consciousness: awake and alert, oriented and patient cooperative ?Pain management: pain level controlled ?Vital Signs Assessment: post-procedure vital signs reviewed and stable ?Respiratory status: spontaneous breathing, nonlabored ventilation and respiratory function stable ?Cardiovascular status: blood pressure returned to baseline and stable ?Postop Assessment: no apparent nausea or vomiting ?Anesthetic complications: no ? ? ?No notable events documented. ? ?Last Vitals:  ?Vitals:  ? 05/21/21 1300 05/21/21 1330  ?BP: (!) 143/91 (!) 155/102  ?Pulse: 86 77  ?Resp: 18   ?Temp: (!) 36.4 ?C (!) 36.4 ?C  ?SpO2: 93% 96%  ?  ? ? ? ?  ?  ?  ?  ?  ?  ? ?Jarome Matin Tanija Germani ? ? ? ? ?

## 2021-05-21 NOTE — Brief Op Note (Signed)
05/21/2021 ? ?11:39 AM ? ?PATIENT:  Alyssa Becker  26 y.o. female ? ?PRE-OPERATIVE DIAGNOSIS:  GYN MALIGNANCY ? ?POST-OPERATIVE DIAGNOSIS:  GYN MALIGNANCY ? ?PROCEDURE:   ?Modified robotic radical hysterectomy, left oophorectomy ?Modifier 22 ? ?SURGEON:  Surgeon(s) and Role: ?   [name], MD - Primary ?   Lahoma Crocker, MD - Assisting ? ?ANESTHESIA:   general ? ?EBL:  100 mL  ? ?BLOOD ADMINISTERED:none ? ?DRAINS: none  ? ?LOCAL MEDICATIONS USED:  MARCAINE    ? ?SPECIMEN:  left ovary, uterus, cervix, right parametrium and uterosacral ligament ? ?DISPOSITION OF SPECIMEN:  PATHOLOGY ? ?COUNTS:  YES ? ?TOURNIQUET:  * No tourniquets in log * ? ?DICTATION: .Note written in EPIC ? ?PLAN OF CARE: Discharge to home after PACU ? ?PATIENT DISPOSITION:  PACU - hemodynamically stable. ?  ?Delay start of Pharmacological VTE agent (>24hrs) due to surgical blood loss or risk of bleeding: not applicable ? ?

## 2021-05-21 NOTE — Anesthesia Preprocedure Evaluation (Signed)
Anesthesia Evaluation  ?Patient identified by MRN, date of birth, ID band ?Patient awake ? ? ? ?Reviewed: ?Allergy & Precautions, NPO status , Patient's Chart, lab work & pertinent test results ? ?Airway ?Mallampati: III ? ?TM Distance: >3 FB ?Neck ROM: Full ? ? ? Dental ? ?(+) Dental Advisory Given ?  ?Pulmonary ?asthma ,  ?  ?Pulmonary exam normal ?breath sounds clear to auscultation ? ? ? ? ? ? Cardiovascular ?negative cardio ROS ?Normal cardiovascular exam ?Rhythm:Regular Rate:Normal ? ? ?  ?Neuro/Psych ? Headaches, PSYCHIATRIC DISORDERS Anxiety Depression   ? GI/Hepatic ?negative GI ROS, Neg liver ROS,   ?Endo/Other  ?diabetes, Well Controlled, Type 2, Oral Hypoglycemic AgentsMorbid obesitya1c 5.3 ?BMI 46 ? Renal/GU ?negative Renal ROS  ?negative genitourinary ?  ?Musculoskeletal ?negative musculoskeletal ROS ?(+)  ? Abdominal ?(+) + obese,   ?Peds ? Hematology ?negative hematology ROS ?(+) 03/27/21 Hb 11.8   ?Anesthesia Other Findings ?Cervical Cancer ? Reproductive/Obstetrics ?negative OB ROS ?Cervical ca s/p chemo, radiation, surgery  ? ?  ? ? ? ? ? ? ? ? ? ? ? ? ? ?  ?  ? ? ? ? ? ? ? ? ?Anesthesia Physical ? ?Anesthesia Plan ? ?ASA: 3 ? ?Anesthesia Plan: General  ? ?Post-op Pain Management: Tylenol PO (pre-op)*, Celebrex PO (pre-op)*, Toradol IV (intra-op)*, Gabapentin PO (pre-op)* and Dilaudid IV  ? ?Induction: Intravenous ? ?PONV Risk Score and Plan: 3 and Ondansetron, Dexamethasone, Midazolam and Treatment may vary due to age or medical condition ? ?Airway Management Planned: Oral ETT ? ?Additional Equipment: None ? ?Intra-op Plan:  ? ?Post-operative Plan: Extubation in OR ? ?Informed Consent: I have reviewed the patients History and Physical, chart, labs and discussed the procedure including the risks, benefits and alternatives for the proposed anesthesia with the patient or authorized representative who has indicated his/her understanding and acceptance.  ? ? ? ?Dental  advisory given ? ?Plan Discussed with: CRNA ? ?Anesthesia Plan Comments: (Right chest wall port in place)  ? ? ? ? ? ? ?Anesthesia Quick Evaluation ? ?

## 2021-05-22 ENCOUNTER — Telehealth: Payer: Self-pay

## 2021-05-22 ENCOUNTER — Encounter (HOSPITAL_COMMUNITY): Payer: Self-pay | Admitting: Gynecologic Oncology

## 2021-05-22 NOTE — Telephone Encounter (Signed)
Spoke with Ms. Hammill this morning. She states she is eating and drinking well. She reports she is voiding ok, but sometimes feels like she does not empty her bladder. Per Berline Lopes she needs to void every 2-3 hours during the day even if she does not feel the urge to urinate.Instructed to contact our office if she is unable to empty her bladder, has increasing pain or any other urinary symptoms. Patient verbalized understanding. ?She has not had a BM yet and is not passing gas. She is not taking senokot. Instructed patient to pick up senokot from the pharmacy and take nightly with a full glass of water, she can also take miralax in addition to senokot. Hold for loose stools. She denies fever or chills. Incisions are dry and intact, she reports some light bleeding from her left lower incision earlier this morning. She has placed a Band-Aid over the incision and bleeding has stopped. Instructed to monitor, some drainage is expected, notify our office is drainage persists.  She rates her pain 6/10. Her pain is controlled with tylenol and ibuprofen. She does have oxycodone if needed and advised to use an ice-pack or heating pack as needed on her abdomen.    ? ?Instructed to call office with any fever, chills, purulent drainage, uncontrolled pain or any other questions or concerns. Patient verbalizes understanding.  ? ?Pt aware of post op appointments as well as the office number [phone] and after hours number [phone] to call if she has any questions or concerns  ?

## 2021-05-25 LAB — TYPE AND SCREEN
ABO/RH(D): A POS
Antibody Screen: POSITIVE
Donor AG Type: NEGATIVE
Donor AG Type: NEGATIVE
Unit division: 0
Unit division: 0

## 2021-05-25 LAB — BPAM RBC
Blood Product Expiration Date: 202305142359
Blood Product Expiration Date: 202306032359
Unit Type and Rh: 6200
Unit Type and Rh: 6200

## 2021-05-28 ENCOUNTER — Inpatient Hospital Stay: Payer: Medicaid Other | Admitting: Gynecologic Oncology

## 2021-05-29 ENCOUNTER — Telehealth: Payer: Self-pay

## 2021-05-29 ENCOUNTER — Inpatient Hospital Stay: Payer: Medicaid Other | Admitting: Gynecologic Oncology

## 2021-05-29 NOTE — Telephone Encounter (Signed)
Patient called and stated " I need to speak with Dr. Berline Lopes, I have an appointment with her at 5pm." Notified patient Dr. Berline Lopes is currently in the OR. Advised patient we will need to reschedule her phone visit for today as her pathology has not resulted yet.We will call her once her results are in. Patient verbalized understanding.  ?

## 2021-05-30 ENCOUNTER — Encounter: Payer: Self-pay | Admitting: Gynecologic Oncology

## 2021-05-31 ENCOUNTER — Encounter: Payer: Self-pay | Admitting: Gynecologic Oncology

## 2021-06-03 ENCOUNTER — Encounter: Payer: Self-pay | Admitting: Hematology and Oncology

## 2021-06-03 LAB — SURGICAL PATHOLOGY

## 2021-06-03 NOTE — Patient Instructions (Signed)
Preparing for your Surgery ?  ?Plan for surgery on May 21, 2021 with Dr. Jeral Pinch at [place] will be scheduled for robotic assisted total laparoscopic hysterectomy (removal of the uterus and cervix), left oophorectomy (removal of left ovary), possible mini laparotomy (slightly larger incision on the abdomen), possible laparotomy (larger incision on your abdomen if needed), and any other indicated procedures.   ?  ?Pre-operative Testing ?-You will receive a phone call from presurgical testing at [facility] to arrange for a pre-operative appointment and lab work. ?  ?-Bring your insurance card, copy of an advanced directive if applicable, medication list ?  ?-At that visit, you will be asked to sign a consent for a possible blood transfusion in case a transfusion becomes necessary during surgery.  The need for a blood transfusion is rare but having consent is a necessary part of your care.    ?  ?-You should not be taking blood thinners or aspirin at least ten days prior to surgery unless instructed by your surgeon. ?  ?-Do not take supplements such as fish oil (omega 3), red yeast rice, turmeric before your surgery. You want to avoid medications with aspirin in them including headache powders such as BC or Goody's), Excedrin migraine. ?  ?Day Before Surgery at Home ?-You will be asked to take in a light diet the day before surgery. You will be advised you can have clear liquids up until 3 hours before your surgery.   ?  ?Eat a light diet the day before surgery.  Examples including soups, broths, toast, yogurt, mashed potatoes.  AVOID GAS PRODUCING FOODS. Things to avoid include carbonated beverages (fizzy beverages, sodas), raw fruits and raw vegetables (uncooked), or beans.  ?  ?If your bowels are filled with gas, your surgeon will have difficulty visualizing your pelvic organs which increases your surgical risks. ?  ?Your role in recovery ?Your role is to become active as soon as  directed by your doctor, while still giving yourself time to heal.  Rest when you feel tired. You will be asked to do the following in order to speed your recovery: ?  ?- Cough and breathe deeply. This helps to clear and expand your lungs and can prevent pneumonia after surgery.  ?- STAY ACTIVE WHEN YOU GET HOME. Do mild physical activity. Walking or moving your legs help your circulation and body functions return to normal. Do not try to get up or walk alone the first time after surgery.   ?-If you develop swelling on one leg or the other, pain in the back of your leg, redness/warmth in one of your legs, please call the office or go to the Emergency Room to have a doppler to rule out a blood clot. For shortness of breath, chest pain-seek care in the Emergency Room as soon as possible. ?- Actively manage your pain. Managing your pain lets you move in comfort. We will ask you to rate your pain on a scale of zero to 10. It is your responsibility to tell your doctor or nurse where and how much you hurt so your pain can be treated. ?  ?Special Considerations ?-If you are diabetic, you may be placed on insulin after surgery to have closer control over your blood sugars to promote healing and recovery.  This does not mean that you will be discharged on insulin.  If applicable, your oral antidiabetics will be resumed when you are tolerating a solid diet. ?  ?-Your  final pathology results from surgery should be available around one week after surgery and the results will be relayed to you when available. ?  ?-FMLA forms can be faxed to [phone] and please allow 5-7 business days for completion. ?  ?Pain Management After Surgery ?-You have been prescribed your pain medication and bowel regimen medications before surgery so that you can have these available when you are discharged from the hospital. The pain medication is for use ONLY AFTER surgery and a new prescription will not be given.  ?  ?-Make sure that you have  Tylenol and Ibuprofen at home to use on a regular basis after surgery for pain control. We recommend alternating the medications every hour to six hours since they work differently and are processed in the body differently for pain relief. ?  ?-Review the attached handout on narcotic use and their risks and side effects.  ?  ?Bowel Regimen ?-You have been prescribed Sennakot-S to take nightly to prevent constipation especially if you are taking the narcotic pain medication intermittently.  It is important to prevent constipation and drink adequate amounts of liquids. You can stop taking this medication when you are not taking pain medication and you are back on your normal bowel routine. ?  ?Risks of Surgery ?Risks of surgery are low but include bleeding, infection, damage to surrounding structures, re-operation, blood clots, and very rarely death. ?  ?  ?Blood Transfusion Information (For the consent to be signed before surgery) ?  ?We will be checking your blood type before surgery so in case of emergencies, we will know what type of blood you would need. ?  ?                                          WHAT IS A BLOOD TRANSFUSION? ?  ?A transfusion is the replacement of blood or some of its parts. Blood is made up of multiple cells which provide different functions. ?Red blood cells carry oxygen and are used for blood loss replacement. ?White blood cells fight against infection. ?Platelets control bleeding. ?Plasma helps clot blood. ?Other blood products are available for specialized needs, such as hemophilia or other clotting disorders. ?BEFORE THE TRANSFUSION  ?Who gives blood for transfusions?  ?You may be able to donate blood to be used at a later date on yourself (autologous donation). ?Relatives can be asked to donate blood. This is generally not any safer than if you have received blood from a stranger. The same precautions are taken to ensure safety when a relative's blood is donated. ?Healthy volunteers who  are fully evaluated to make sure their blood is safe. This is blood bank blood. ?Transfusion therapy is the safest it has ever been in the practice of medicine. Before blood is taken from a donor, a complete history is taken to make sure that person has no history of diseases nor engages in risky social behavior (examples are intravenous drug use or sexual activity with multiple partners). The donor's travel history is screened to minimize risk of transmitting infections, such as malaria. The donated blood is tested for signs of infectious diseases, such as HIV and hepatitis. The blood is then tested to be sure it is compatible with you in order to minimize the chance of a transfusion reaction. If you or a relative donates blood, this is often done in anticipation of surgery and  is not appropriate for emergency situations. It takes many days to process the donated blood. ?RISKS AND COMPLICATIONS ?Although transfusion therapy is very safe and saves many lives, the main dangers of transfusion include:  ?Getting an infectious disease. ?Developing a transfusion reaction. This is an allergic reaction to something in the blood you were given. Every precaution is taken to prevent this. ?The decision to have a blood transfusion has been considered carefully by your caregiver before blood is given. Blood is not given unless the benefits outweigh the risks. ?  ?AFTER SURGERY INSTRUCTIONS ?  ?Return to work: 4-6 weeks if applicable ?  ?Activity: ?1. Be up and out of the bed during the day.  Take a nap if needed.  You may walk up steps but be careful and use the hand rail.  Stair climbing will tire you more than you think, you may need to stop part way and rest.  ?  ?2. No lifting or straining for 6 weeks over 10 pounds. No pushing, pulling, straining for 6 weeks. ?  ?3. No driving for around 1 week(s).  Do not drive if you are taking narcotic pain medicine and make sure that your reaction time has returned.  ?  ?4. You can  shower as soon as the next day after surgery. Shower daily.  Use your regular soap and water (not directly on the incision) and pat your incision(s) dry afterwards; don't rub.  No tub baths or submerging

## 2021-06-03 NOTE — Progress Notes (Signed)
Patient here for follow up with Dr. Jeral Pinch and for a pre-operative discussion prior to her scheduled surgery on May 21, 2021. She is scheduled for robotic assisted total laparoscopic hysterectomy, left oophorectomy, possible mini laparotomy, possible laparotomy, and any other indicated procedures. The surgery was discussed in detail.  See after visit summary for additional details. Visual aids used to discuss items related to surgery including sequential compression stockings, foley catheter, IV pump, multi-modal pain regimen including tylenol, photo of the surgical robot, female reproductive system to discuss surgery in detail.    ?  ?Discussed post-op pain management in detail including the aspects of the enhanced recovery pathway.  Advised her that a new prescription would be sent in for oxycodone and it is only to be used for after her upcoming surgery.  We discussed the use of tylenol post-op and to monitor for a maximum of 4,000 mg in a 24 hour period.  Also prescribed sennakot to be used after surgery and to hold if having loose stools. Discussed bowel regimen in detail.   ?  ?Discussed the use of SCDs and measures to take at home to prevent DVT including frequent mobility.  Reportable signs and symptoms of DVT discussed. Post-operative instructions discussed and expectations for after surgery. Incisional care discussed as well including reportable signs and symptoms including erythema, drainage, wound separation.  ?   ?5 minutes spent with the patient.  Verbalizing understanding of material discussed. No needs or concerns voiced at the end of the visit.   Advised patient and family to call for any needs.  Advised that her post-operative medications had been prescribed and could be picked up at any time.   ? ?This appointment is included in the global surgical bundle as pre-operative teaching and has no charge.     ?

## 2021-06-10 ENCOUNTER — Encounter: Payer: Self-pay | Admitting: Gynecologic Oncology

## 2021-06-10 ENCOUNTER — Other Ambulatory Visit: Payer: Self-pay | Admitting: Oncology

## 2021-06-10 ENCOUNTER — Inpatient Hospital Stay: Payer: Medicaid Other | Attending: Radiation Oncology | Admitting: Gynecologic Oncology

## 2021-06-10 ENCOUNTER — Other Ambulatory Visit: Payer: Self-pay

## 2021-06-10 VITALS — BP 135/84 | HR 95 | Temp 98.3°F | Ht 66.0 in | Wt 272.4 lb

## 2021-06-10 DIAGNOSIS — Z90721 Acquired absence of ovaries, unilateral: Secondary | ICD-10-CM

## 2021-06-10 DIAGNOSIS — N951 Menopausal and female climacteric states: Secondary | ICD-10-CM

## 2021-06-10 DIAGNOSIS — Z90710 Acquired absence of both cervix and uterus: Secondary | ICD-10-CM

## 2021-06-10 DIAGNOSIS — C539 Malignant neoplasm of cervix uteri, unspecified: Secondary | ICD-10-CM

## 2021-06-10 DIAGNOSIS — Z7189 Other specified counseling: Secondary | ICD-10-CM

## 2021-06-10 MED ORDER — ESTRADIOL 0.05 MG/24HR TD PTWK: 0.0500 mg | MEDICATED_PATCH | TRANSDERMAL | 12 refills | Status: DC

## 2021-06-10 MED ORDER — ESTRADIOL 0.0375 MG/24HR TD PTWK: 0.0375 mg | MEDICATED_PATCH | TRANSDERMAL | 12 refills | Status: DC

## 2021-06-10 NOTE — Patient Instructions (Addendum)
It was good to see you today.  You are healing well from surgery.  Remember, no heavy lifting until at least 6 weeks after surgery, and nothing in the vagina for at least 8 weeks.  We will have a phone visit in the month to check in on her menopausal symptoms and decide about hormone replacement therapy.  I will call you after your PET scan to discuss results.  If PET scan looks like lymph nodes and all other areas have responded to treatment, then we will plan to follow you closely with visits every 3 months.

## 2021-06-10 NOTE — Progress Notes (Signed)
Gynecologic Oncology Multi-Disciplinary Disposition Conference Note  Date of the Conference: 06/10/2021  Patient Name: Alyssa Becker  Referring Provider: Dr. Nelda Marseille Primary GYN Oncologist: Dr. 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 It is favored to be endocervical adenocarcinoma with excellent treatment response. Disposition is to a post treatment PET scan in 3 months to monitor lymphadenopathy followed by close observation. Chemotherapy would be an option for treatment if needed in the future.  This Multidisciplinary conference took place involving physicians from [place], Medical Oncology, Radiation Oncology, Pathology, Radiology along with the Gynecologic Oncology Nurse Practitioner and Gynecologic Oncology Nurse Navigator.  Comprehensive assessment of the patient's malignancy, staging, need for surgery, chemotherapy, radiation therapy, and need for further testing were reviewed. Supportive measures, both inpatient and following discharge were also discussed. The recommended plan of care is documented. Greater than 35 minutes were spent correlating and coordinating this patient's care.

## 2021-06-10 NOTE — Progress Notes (Signed)
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 Large indeterminate right adnexal mass with solid hypervascular and cystic components. This appears separate from the base of the appendix on preceding CT and is likely arising from the right ovary based on relationship to the gonadal vessels on CT. Given the patient's young age, hypervascularity and leukocytosis, findings could represent an atypical tubo-ovarian abscess. However, the appearance is concerning for ovarian malignancy despite the patient's young age. Recommend prompt gynecology consultation. Pelvic MRI without and with contrast may be helpful for further evaluation. 2. The left ovary appears normal. Mild nonspecific thickening of the endometrium, within physiologic limits.   01/02/2021 Pathology Results   SPECIMEN ADEQUACY: Satisfactory for evaluation; transformation zone component PRESENT. INTERPRETATION: - Adenocarcinoma, NOS   01/15/2021 Imaging   MRI pelvis  13.0 cm cystic and solid mass arising from the right ovary, highly suspicious for ovarian carcinoma.   Mild endometrial thickening and 4.5 cm soft tissue mass within the endocervical canal. Differential diagnosis includes prolapsing endometrial polyp or carcinoma, or primary cervical carcinoma.   No evidence of pelvic metastatic disease.   01/16/2021 Initial Diagnosis   Malignant neoplasm of cervix ([place])   01/22/2021 PET scan   1. Large cystic/necrotic right adnexal mass is markedly hypermetabolic and consistent with known neoplasm. 2. Extensive hypermetabolic tumor involving the endometrium and cervix. 3. Scattered borderline enlarged and mildly  hypermetabolic retroperitoneal and external iliac nodes. 4. No findings for metastatic disease involving the chest or bony structures.   01/24/2021 Surgery   Pre-operative Diagnosis: Complex adnexal mass, elevated CA-125, locally invasive cervical adenocarcinoma versus endometrial adenocarcinoma   Post-operative Diagnosis: same, high-grade carcinoma of the right ovary, suspected history of superinfection of the adnexal mass   Operation: Robotic-assisted laparoscopic bilateral salpingectomy, right oophorectomy, lysis of adhesions of approximately 45 minutes, right ureterolysis   Surgeon: Jeral Pinch MD    Operative Findings: On EUA, cervix enlarged and firm, fleshy tumor replacing the endocervix.  No definitive parametrial involvement appreciated.  Even gentle exam because significant bleeding so vagina was packed with lap sponges for the remainder of surgery.  On intra-abdominal exam, normal upper abdominal survey including omentum, liver edge, diaphragm, and stomach.  Normal small bowel.  Some physiologic adhesions of the sigmoid epiploica to the left sidewall.  Normal-appearing left ovary measuring approximately 3-4 cm.  Normal-appearing bilateral fallopian tubes.  Uterus approximately 8-10 cm and overall normal in appearance.  Right ovary replaced by a smooth but vascular 10 cm mass with multiple loculations.  Ovary with dense adhesions to the right pelvic sidewall.  Upon very careful manipulation of the mass for its removal, there was some drainage of fluid noted that was somewhat purulent.  This was sent for culture and Gram stain.  Upon contained mass removal with fractionation of the mass, tumor versus necrotic material was noted.  Frozen section consistent with high-grade carcinoma, unable to differentiate on frozen section whether this is the same cancer being seen on her cervical biopsies.  Right ureterolysis performed given oozing services from the broad ligament and peritoneum where  adhesions were lysed between the ovary and peritoneum, to assure ureter not in close proximity.  No obvious adenopathy.  No ascites.  Left  ovary transposed out of the pelvis along the left gutter, clips placed along the sidewall as well as the superior and inferior aspect of the ovary.     01/24/2021 Pathology Results   FINAL MICROSCOPIC DIAGNOSIS:   A. OVARY AND FALLOPIAN TUBE, RIGHT, SALPINGO OOPHORECTOMY:  - Endometrioid carcinoma, moderately differentiated, involving right ovary  - No evidence of ovarian surface involvement by carcinoma  - Segment of fallopian tube, negative for carcinoma  - See oncology table  - See comment   B. FALLOPIAN TUBE, LEFT, SALPINGECTOMY:  - Segment of fallopian tube, negative for carcinoma   A.  Immunostain for p16 shows patchy staining.  Immunostain for p53 shows a normal, wild-type expression pattern.  This immunoprofile is consistent with above interpretation.  Patient's previous cervical  biopsy was reviewed - it shows moderately differentiated carcinoma with a very similar histomorphology and very likely presents metastasis from the ovarian tumor to the cervix.    02/05/2021 Cancer Staging   Staging form: Cervix Uteri, AJCC Version 9 - Pathologic stage from 02/05/2021: FIGO Stage II (pT2, pN0, cM0) - Signed by [name], MD on 02/05/2021 Stage prefix: Initial diagnosis    02/14/2021 Procedure   Successful placement of a right internal jugular approach power injectable Port-A-Cath. The catheter is ready for immediate use   02/22/2021 - 03/22/2021 Chemotherapy   Patient is on Treatment Plan : Cervical cancer Cisplatin q7d      02/25/2021 - 03/29/2021 Radiation Therapy   Radiation Treatment Dates: 02/25/2021 through 04/09/2021 Pelvic IMRT: 02/25/21 through 03/29/21 HDR-brachy: 04/09/21 Site Technique Total Dose (Gy) Dose per Fx (Gy) Completed Fx Beam Energies  Pelvis: Pelvis IMRT 45/45 1.8 25/25 6X  Cervix: Cervix_Bst_Fx1 HDR-brachy 5.5/5.5 5.5 1/1 Ir-192          05/13/2021 Imaging   1. Status post bilateral oophorectomy. Previously seen large hypermetabolic right adnexal mass has been resected. Nodular soft tissue in the vicinity of both the left and right oophorectomy bed, likely postoperative in nature. Attention on follow-up. 2. Previously noted uterine/cervical mass is resolved, consistent with treatment response. 3. Prominent, previously FDG avid iliac and retroperitoneal lymph nodes are slightly diminished in size, consistent with treatment response. 4. Hypodense liver lesion of hepatic segment IVa measuring 1.5 x 1.2 cm, not previously appreciated by contrast enhanced CT or PET-CT, incompletely characterized although worrisome for hepatic metastasis. This could be further characterized by multiphasic contrast enhanced MRI. 5. Hepatomegaly and hepatic steatosis. 6. Splenomegaly.     05/21/2021 Surgery   Robotic-assisted modified radical hysterectomy with left oophorectomy, cystoscopy  Findings:  On EUA, some radiation changes noted with deviation of the cervix posteriorly. On intra-abdominal entry, normal upper abdominal survey. Normal omentum, small and large bowel. Surgically absent right adnexa. Left ovary pexed along sidewall above the pelvic brim (normal in appearance). Uterus 8-10 cm and somewhat bulbous. Significant retroperitoneal edema, some fibrosis. 2-3 cm tumor implant along the right parametrium and distal uterosacral ligament, free from the cervix itself. No obvious paracervical tumor extension. No gross adenopathy. No ascites.  On cystoscopy, bladder intact, good efflux noted from bilateral ureteral orifices.   05/21/2021 Pathology Results   A. UTERUS AND CERVIX, HYSTERECTOMY:  - Microscopic fragment in the upper endocervical canal suspicious for  treated adenocarcinoma associated with extensive treatment response.  - Fibrosis and cytologic atypia in the endometrium consistent with  treatment effects.  - See comment.   B.  PERIMETRIUM, RIGHT, EXCISION  - Amorphous material associated with histiocytic response.  - Connective tissue with inflammation,  fibrosis and dystrophic  calcifications.  - No malignancy identified.   C. OVARY, LEFT, OOPHORECTOMY:  - Benign ovary.  - No malignancy identified.   COMMENT:  There is a 1.7 cm polypoid mass involving the anterior endocervical canal which histologically shows dense fibrosis with scattered glands with cytologic atypia consistent with therapy effects.  This area has a microscopic, partially detached fragment with marked atypia and architectural changes suspicious for microscopic residual adenocarcinoma.  Elsewhere, the endometrium shows foci of similar fibrosis with patchy cytologic atypia consistent with therapy effects.  There are foci where fibrosis extends into the adjacent upper portions of the myometrium.  There is insufficient residual suspicious tissue present to resolve the location of the primary in this case.  The pattern of fibrotic apparent therapy affects is more suggestive of an ovarian metastasis from an endocervical or endometrial primary.      Interval History: Patient presents today for follow-up after surgery.  Reports overall doing well.  Denies any abdominal or pelvic pain.  Continues to have intermittent mid/low back pain, unchanged.  If she has back pain, it tends to be worse at night.  Has started having hot flashes which she describes as happening nightly, somewhat bothersome.  She denies any vaginal bleeding or discharge.  Reports regular bowel and bladder function.  Past Medical/Surgical History: Past Medical History:  Diagnosis Date   Anxiety    Asthma    Depression    History of chemotherapy    02-22-2021  to 03-22-2021  for cervical cancer   History of COVID-19 02/2020   followed by pcp   (04-03-2021  per pt checks blood sugar once weekly)   History of external beam radiation therapy    02-25-2021  to 03-29-2021 for cervical  cancer   History of radiation therapy    cervix - tandem and ring 04/09/2021  Dr Gery Pray   Iron deficiency anemia due to chronic blood loss    Malignant neoplasm cervix [facility])    oncologist--- dr gorsuch// radiation onologist-- dr Sondra Come;   dx 01-16-2021;   01-24-2021  s/p robotic RSO and left salpingectomy for right ovarian mass (endometrioid carcinoma);  completed chemo 03-22-2021 and IMRT 03-29-2021 (scheduled for high dose brachytherapy 04-09-2021)   Migraines    migraine   Morbid obesity ([place])    Type 2 diabetes mellitus ([place])     Past Surgical History:  Procedure Laterality Date   IR IMAGING GUIDED PORT INSERTION  02/13/2021   OPERATIVE ULTRASOUND N/A 04/09/2021   Procedure: OPERATIVE ULTRASOUND;  Surgeon: Gery Pray, MD;  Location: [place];  Service: Urology;  Laterality: N/A;   ROBOTIC ASSISTED TOTAL HYSTERECTOMY N/A 05/21/2021   Procedure: XI ROBOTIC ASSISTED TOTAL HYSTERECTOMY;  Surgeon: [name], MD;  Location: [facility];  Service: Gynecology;  Laterality: N/A;   TANDEM RING INSERTION N/A 04/09/2021   Procedure: TANDEM RING INSERTION;  Surgeon: Gery Pray, MD;  Location: [facility];  Service: Urology;  Laterality: N/A;   TONSILLECTOMY     child   XI ROBOTIC ASSISTED OOPHORECTOMY N/A 01/24/2021   Procedure: XI ROBOTIC ASSISTED RIGHT OOPHORECTOMY, OOPHOROPEXY OF LEFT OVARY;  Surgeon: [name], MD;  Location: [facility];  Service: Gynecology;  Laterality: N/A;   XI ROBOTIC ASSISTED OOPHORECTOMY Left 05/21/2021   Procedure: XI ROBOTIC ASSISTED LEFT OOPHORECTOMY;  Surgeon: [name], MD;  Location: [facility];  Service: Gynecology;  Laterality: Left;   XI ROBOTIC ASSISTED SALPINGECTOMY Bilateral 01/24/2021   Procedure: XI  ROBOTIC ASSISTED SALPINGECTOMY; RIGHT URETERAL LYSIS; LYSIS OF ADHESIONS;  Surgeon: [name], MD;  Location: [facility];  Service: Gynecology;  Laterality: Bilateral;    Family History  Problem  Relation Age of Onset   Asthma Mother    Diabetes Mother    Hypertension Mother    Breast cancer Mother    Asthma Father    Diabetes Father    Kidney disease Father    Diabetes Sister    Asthma Sister    Asthma Brother    Diabetes Brother    Cancer Maternal Aunt    Diabetes Maternal Uncle    Diabetes Maternal Grandmother    Hypertension Maternal Grandmother    Anemia Maternal Grandmother    Heart attack Paternal Grandfather    Cancer Paternal Grandfather    Colon cancer Neg Hx    Ovarian cancer Neg Hx    Endometrial cancer Neg Hx    Pancreatic cancer Neg Hx    Prostate cancer Neg Hx     Social History   Socioeconomic History   Marital status: Single    Spouse name: Not on file   Number of children: Not on file   Years of education: Not on file   Highest education level: Not on file  Occupational History   Not on file  Tobacco Use   Smoking status: Never   Smokeless tobacco: Never  Vaping Use   Vaping Use: Never used  Substance and Sexual Activity   Alcohol use: Never   Drug use: Never   Sexual activity: Not Currently    Birth control/protection: None    Comment: female partner  Other Topics Concern   Not on file  Social History Narrative   Not on file   Social Determinants of Health   Financial Resource Strain: Not on file  Food Insecurity: Not on file  Transportation Needs: Not on file  Physical Activity: Not on file  Stress: Not on file  Social Connections: Not on file    Current Medications:  Current Outpatient Medications:    albuterol (VENTOLIN HFA) 108 (90 Base) MCG/ACT inhaler, Inhale 1-2 puffs into the lungs every 6 (six) hours as needed for wheezing or shortness of breath., Disp: , Rfl:    ferrous sulfate 325 (65 FE) MG tablet, Take 325 mg by mouth once a week., Disp: , Rfl:    ibuprofen (ADVIL) 200 MG tablet, Take 200 mg by mouth 2 (two) times daily as needed for headache (pain)., Disp: , Rfl:    metFORMIN (GLUCOPHAGE) 500 MG tablet, Take  1 tablet (500 mg total) by mouth 2 (two) times daily with a meal., Disp: 60 tablet, Rfl: 1   propranolol (INDERAL) 40 MG tablet, Take 40 mg by mouth daily as needed (migraines)., Disp: , Rfl:   Review of Systems: + Occasional ringing in ears, hot flashes, back pain. Denies appetite changes, fevers, chills, fatigue, unexplained weight changes. Denies hearing loss, neck lumps or masses, mouth sores, or voice changes. Denies cough or wheezing.  Denies shortness of breath. Denies chest pain or palpitations. Denies leg swelling. Denies abdominal distention, pain, blood in stools, constipation, diarrhea, nausea, vomiting, or early satiety. Denies pain with intercourse, dysuria, frequency, hematuria or incontinence. Denies pelvic pain, vaginal bleeding or vaginal discharge.  Denies joint pain or muscle pain/cramps. Denies itching, rash, or wounds. Denies dizziness, headaches, numbness or seizures. Denies swollen lymph nodes or glands, denies easy bruising or bleeding. Denies anxiety, depression, confusion, or decreased concentration.  Physical Exam: BP  135/84 (BP Location: Right Arm, Patient Position: Sitting)   Pulse 95   Temp 98.3 F (36.8 C) (Oral)   Ht _0  (1.676 m)   Wt 272 lb 6.4 oz (123.6 kg)   SpO2 98%   BMI 43.97 kg/m  General: Alert, oriented, no acute distress. HEENT: Normocephalic, atraumatic, sclera anicteric. Chest: Unlabored breathing on room air. Abdomen: Obese, soft, nontender.  Normoactive bowel sounds.  No masses or hepatosplenomegaly appreciated.  Well-healed incisions. Extremities: Grossly normal range of motion.  Warm, well perfused.  No edema bilaterally. Skin: No rashes or lesions noted. GU: Normal appearing external genitalia without erythema, excoriation, or lesions.  Speculum exam reveals somewhat atrophic vaginal mucosa with radiation changes present.  Cuff intact with suture visible.  Bimanual exam reveals cuff intact, no fluctuance or tenderness with  palpation.   Laboratory & Radiologic Studies: A. UTERUS AND CERVIX, HYSTERECTOMY:  - Microscopic fragment in the upper endocervical canal suspicious for  treated adenocarcinoma associated with extensive treatment response.  - Fibrosis and cytologic atypia in the endometrium consistent with  treatment effects.  - See comment.   B. PERIMETRIUM, RIGHT, EXCISION  - Amorphous material associated with histiocytic response.  - Connective tissue with inflammation, fibrosis and dystrophic  calcifications.  - No malignancy identified.   C. OVARY, LEFT, OOPHORECTOMY:  - Benign ovary.  - No malignancy identified.   Assessment & Plan: Alyssa Becker is a 26 y.o. 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 PDL1 10%.  Patient is overall doing well from a postoperative standpoint.  Meeting all milestones.  Discussed continued restrictions as well as postoperative expectations.  Given young age and hot flashes, discussed starting low-medium dose hormone replacement therapy.  Reviewed some risk in the setting of metastatic cancer as well as ER+ cancer.  Given overall low symptomatology from hot flashes, we will plan to wait starting hormone replacement at this time.  We will have a phone visit in 1 month to discuss again.  In the setting of cervical cancer, hormone replacement would be very reasonable.  My concern is that we still have not determined whether this is cervical versus uterine and will be unlikely to have a definitive answer to this.  In terms of assessing her disease status, we will plan on a PET scan approximately 3-4 months after she completed radiation.  We reviewed her pathology from surgery in detail which shows an excellent response to treatment.  If PET scan shows improved/resolved avidity of her retroperitoneal adenopathy and  no evidence of metastatic disease, then we will transition into surveillance.  If findings on PET scan are concerning for persistent disease, then we will move forward with systemic chemotherapy.  28 minutes of total time was spent for this patient encounter, including preparation, face-to-face counseling with the patient and coordination of care, and documentation of the encounter.  Jeral Pinch, MD  Division of Gynecologic Oncology  Department of Obstetrics and Gynecology  [facility]

## 2021-06-11 LAB — SURGICAL PATHOLOGY

## 2021-06-24 ENCOUNTER — Encounter: Payer: Self-pay | Admitting: Gynecologic Oncology

## 2021-06-24 NOTE — Progress Notes (Signed)
Called and spoke with insurance company. PET scan valid today through Aug 3. # [id number]. Auth team notified.

## 2021-07-05 ENCOUNTER — Telehealth: Payer: Self-pay

## 2021-07-05 NOTE — Telephone Encounter (Signed)
Received call from patient inquiring when she can go swimming. Patient is 6 weeks post-op. Per Dr. Berline Lopes patient needs to wait 2 more weeks. She can go swimming 8 weeks after surgery. Patient verbalized understanding and instructed to call with any needs.

## 2021-07-10 ENCOUNTER — Encounter: Payer: Self-pay | Admitting: Gynecologic Oncology

## 2021-07-10 ENCOUNTER — Inpatient Hospital Stay: Payer: Medicaid Other | Attending: Radiation Oncology | Admitting: Gynecologic Oncology

## 2021-07-10 DIAGNOSIS — Z7189 Other specified counseling: Secondary | ICD-10-CM

## 2021-07-10 DIAGNOSIS — C539 Malignant neoplasm of cervix uteri, unspecified: Secondary | ICD-10-CM

## 2021-07-10 DIAGNOSIS — Z90722 Acquired absence of ovaries, bilateral: Secondary | ICD-10-CM

## 2021-07-10 DIAGNOSIS — N951 Menopausal and female climacteric states: Secondary | ICD-10-CM

## 2021-07-10 DIAGNOSIS — Z90710 Acquired absence of both cervix and uterus: Secondary | ICD-10-CM

## 2021-07-10 MED ORDER — ESTRADIOL 0.05 MG/24HR TD PTWK: 0.0500 mg | MEDICATED_PATCH | TRANSDERMAL | 12 refills | Status: DC

## 2021-07-10 NOTE — Progress Notes (Signed)
Gynecologic Oncology Telehealth Note: Gyn-Onc  I connected with Alyssa Becker on 07/10/21 at  4:40 PM EDT by telephone and verified that I am speaking with the correct person using two identifiers.  I discussed the limitations, risks, security and privacy concerns of performing an evaluation and management service by telemedicine and the availability of in-person appointments. I also discussed with the patient that there may be a patient responsible charge related to this service. The patient expressed understanding and agreed to proceed.  Other persons participating in the visit and their role in the encounter: none.  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 Large indeterminate right adnexal mass with solid hypervascular and cystic components. This appears separate from the base of the appendix on preceding CT and is likely arising from the right ovary based on relationship to the gonadal vessels on CT. Given the patient's young age, hypervascularity and leukocytosis, findings could represent an atypical tubo-ovarian abscess. However, the appearance is concerning for ovarian malignancy despite the patient's young age. Recommend prompt gynecology consultation. Pelvic MRI without and with contrast may be helpful for further evaluation. 2. The left ovary appears normal. Mild nonspecific thickening of the endometrium, within physiologic limits.   01/02/2021 Pathology Results   SPECIMEN ADEQUACY: Satisfactory for evaluation; transformation zone component PRESENT. INTERPRETATION: - Adenocarcinoma, NOS   01/15/2021 Imaging   MRI pelvis  13.0 cm cystic and solid mass arising from the right  ovary, highly suspicious for ovarian carcinoma.   Mild endometrial thickening and 4.5 cm soft tissue mass within the endocervical canal. Differential diagnosis includes prolapsing endometrial polyp or carcinoma, or primary cervical carcinoma.   No evidence of pelvic metastatic disease.   01/16/2021 Initial Diagnosis   Malignant neoplasm of cervix ([place])   01/22/2021 PET scan   1. Large cystic/necrotic right adnexal mass is markedly hypermetabolic and consistent with known neoplasm. 2. Extensive hypermetabolic tumor involving the endometrium and cervix. 3. Scattered borderline enlarged and mildly hypermetabolic retroperitoneal and external iliac nodes. 4. No findings for metastatic disease involving the chest or bony structures.   01/24/2021 Surgery   Pre-operative Diagnosis: Complex adnexal mass, elevated CA-125, locally invasive cervical adenocarcinoma versus endometrial adenocarcinoma   Post-operative Diagnosis: same, high-grade carcinoma of the right ovary, suspected history of superinfection of the adnexal mass   Operation: Robotic-assisted laparoscopic bilateral salpingectomy, right oophorectomy, lysis of adhesions of approximately 45 minutes, right ureterolysis   Surgeon: Jeral Pinch MD    Operative Findings: On EUA, cervix enlarged and firm, fleshy tumor replacing the endocervix.  No definitive parametrial involvement appreciated.  Even gentle exam because significant bleeding so vagina was packed with lap sponges for the remainder of surgery.  On intra-abdominal exam, normal upper abdominal survey including omentum, liver edge, diaphragm, and stomach.  Normal small bowel.  Some physiologic adhesions of the sigmoid epiploica to the left sidewall.  Normal-appearing left ovary measuring approximately 3-4 cm.  Normal-appearing bilateral fallopian tubes.  Uterus approximately 8-10 cm and overall normal in appearance.  Right ovary replaced by a smooth but vascular 10 cm mass with multiple  loculations.  Ovary with dense adhesions to the right pelvic sidewall.  Upon very careful manipulation of the mass for its removal, there was some drainage  of fluid noted that was somewhat purulent.  This was sent for culture and Gram stain.  Upon contained mass removal with fractionation of the mass, tumor versus necrotic material was noted.  Frozen section consistent with high-grade carcinoma, unable to differentiate on frozen section whether this is the same cancer being seen on her cervical biopsies.  Right ureterolysis performed given oozing services from the broad ligament and peritoneum where adhesions were lysed between the ovary and peritoneum, to assure ureter not in close proximity.  No obvious adenopathy.  No ascites.  Left ovary transposed out of the pelvis along the left gutter, clips placed along the sidewall as well as the superior and inferior aspect of the ovary.     01/24/2021 Pathology Results   FINAL MICROSCOPIC DIAGNOSIS:   A. OVARY AND FALLOPIAN TUBE, RIGHT, SALPINGO OOPHORECTOMY:  - Endometrioid carcinoma, moderately differentiated, involving right ovary  - No evidence of ovarian surface involvement by carcinoma  - Segment of fallopian tube, negative for carcinoma  - See oncology table  - See comment   B. FALLOPIAN TUBE, LEFT, SALPINGECTOMY:  - Segment of fallopian tube, negative for carcinoma   A.  Immunostain for p16 shows patchy staining.  Immunostain for p53 shows a normal, wild-type expression pattern.  This immunoprofile is consistent with above interpretation.  Patient's previous cervical  biopsy was reviewed - it shows moderately differentiated carcinoma with a very similar histomorphology and very likely presents metastasis from the ovarian tumor to the cervix.    02/05/2021 Cancer Staging   Staging form: Cervix Uteri, AJCC Version 9 - Pathologic stage from 02/05/2021: FIGO Stage II (pT2, pN0, cM0) - Signed by [name], MD on 02/05/2021 Stage prefix: Initial  diagnosis   02/14/2021 Procedure   Successful placement of a right internal jugular approach power injectable Port-A-Cath. The catheter is ready for immediate use   02/22/2021 - 03/22/2021 Chemotherapy   Patient is on Treatment Plan : Cervical cancer Cisplatin q7d     02/25/2021 - 03/29/2021 Radiation Therapy   Radiation Treatment Dates: 02/25/2021 through 04/09/2021 Pelvic IMRT: 02/25/21 through 03/29/21 HDR-brachy: 04/09/21 Site Technique Total Dose (Gy) Dose per Fx (Gy) Completed Fx Beam Energies  Pelvis: Pelvis IMRT 45/45 1.8 25/25 6X  Cervix: Cervix_Bst_Fx1 HDR-brachy 5.5/5.5 5.5 1/1 Ir-192         05/13/2021 Imaging   1. Status post bilateral oophorectomy. Previously seen large hypermetabolic right adnexal mass has been resected. Nodular soft tissue in the vicinity of both the left and right oophorectomy bed, likely postoperative in nature. Attention on follow-up. 2. Previously noted uterine/cervical mass is resolved, consistent with treatment response. 3. Prominent, previously FDG avid iliac and retroperitoneal lymph nodes are slightly diminished in size, consistent with treatment response. 4. Hypodense liver lesion of hepatic segment IVa measuring 1.5 x 1.2 cm, not previously appreciated by contrast enhanced CT or PET-CT, incompletely characterized although worrisome for hepatic metastasis. This could be further characterized by multiphasic contrast enhanced MRI. 5. Hepatomegaly and hepatic steatosis. 6. Splenomegaly.     05/21/2021 Surgery   Robotic-assisted modified radical hysterectomy with left oophorectomy, cystoscopy  Findings:  On EUA, some radiation changes noted with deviation of the cervix posteriorly. On intra-abdominal entry, normal upper abdominal survey. Normal omentum, small and large bowel. Surgically absent right adnexa. Left ovary pexed along sidewall above the pelvic brim (normal in appearance). Uterus 8-10 cm and somewhat bulbous. Significant retroperitoneal edema, some  fibrosis. 2-3 cm tumor implant along the right parametrium and distal uterosacral ligament, free from the  cervix itself. No obvious paracervical tumor extension. No gross adenopathy. No ascites.  On cystoscopy, bladder intact, good efflux noted from bilateral ureteral orifices.   05/21/2021 Pathology Results   A. UTERUS AND CERVIX, HYSTERECTOMY:  - Microscopic fragment in the upper endocervical canal suspicious for  treated adenocarcinoma associated with extensive treatment response.  - Fibrosis and cytologic atypia in the endometrium consistent with  treatment effects.  - See comment.   B. PERIMETRIUM, RIGHT, EXCISION  - Amorphous material associated with histiocytic response.  - Connective tissue with inflammation, fibrosis and dystrophic  calcifications.  - No malignancy identified.   C. OVARY, LEFT, OOPHORECTOMY:  - Benign ovary.  - No malignancy identified.   COMMENT:  There is a 1.7 cm polypoid mass involving the anterior endocervical canal which histologically shows dense fibrosis with scattered glands with cytologic atypia consistent with therapy effects.  This area has a microscopic, partially detached fragment with marked atypia and architectural changes suspicious for microscopic residual adenocarcinoma.  Elsewhere, the endometrium shows foci of similar fibrosis with patchy cytologic atypia consistent with therapy effects.  There are foci where fibrosis extends into the adjacent upper portions of the myometrium.  There is insufficient residual suspicious tissue present to resolve the location of the primary in this case.  The pattern of fibrotic apparent therapy affects is more suggestive of an ovarian metastasis from an endocervical or endometrial primary.      Interval History: Having bothersome and increasing hot flashes. Denies other menopausal symptoms.  Past Medical/Surgical History: Past Medical History:  Diagnosis Date   Anxiety    Asthma    Depression    History  of chemotherapy    02-22-2021  to 03-22-2021  for cervical cancer   History of COVID-19 02/2020   followed by pcp   (04-03-2021  per pt checks blood sugar once weekly)   History of external beam radiation therapy    02-25-2021  to 03-29-2021 for cervical cancer   History of radiation therapy    cervix - tandem and ring 04/09/2021  Dr Gery Pray   Iron deficiency anemia due to chronic blood loss    Malignant neoplasm cervix [facility])    oncologist--- dr gorsuch// radiation onologist-- dr Sondra Come;   dx 01-16-2021;   01-24-2021  s/p robotic RSO and left salpingectomy for right ovarian mass (endometrioid carcinoma);  completed chemo 03-22-2021 and IMRT 03-29-2021 (scheduled for high dose brachytherapy 04-09-2021)   Migraines    migraine   Morbid obesity ([place])    Type 2 diabetes mellitus ([place])     Past Surgical History:  Procedure Laterality Date   IR IMAGING GUIDED PORT INSERTION  02/13/2021   OPERATIVE ULTRASOUND N/A 04/09/2021   Procedure: OPERATIVE ULTRASOUND;  Surgeon: Gery Pray, MD;  Location: [place];  Service: Urology;  Laterality: N/A;   ROBOTIC ASSISTED TOTAL HYSTERECTOMY N/A 05/21/2021   Procedure: XI ROBOTIC ASSISTED TOTAL HYSTERECTOMY;  Surgeon: [name], MD;  Location: [facility];  Service: Gynecology;  Laterality: N/A;   TANDEM RING INSERTION N/A 04/09/2021   Procedure: TANDEM RING INSERTION;  Surgeon: Gery Pray, MD;  Location: [facility];  Service: Urology;  Laterality: N/A;   TONSILLECTOMY     child   XI ROBOTIC ASSISTED OOPHORECTOMY N/A 01/24/2021   Procedure: XI ROBOTIC ASSISTED RIGHT OOPHORECTOMY, OOPHOROPEXY OF LEFT OVARY;  Surgeon: [name], MD;  Location: [facility];  Service: Gynecology;  Laterality: N/A;   XI ROBOTIC ASSISTED OOPHORECTOMY Left 05/21/2021   Procedure:  XI ROBOTIC ASSISTED LEFT OOPHORECTOMY;  Surgeon: [name], MD;  Location: [facility];  Service: Gynecology;  Laterality: Left;   XI ROBOTIC  ASSISTED SALPINGECTOMY Bilateral 01/24/2021   Procedure: XI ROBOTIC ASSISTED SALPINGECTOMY; RIGHT URETERAL LYSIS; LYSIS OF ADHESIONS;  Surgeon: [name], MD;  Location: [facility];  Service: Gynecology;  Laterality: Bilateral;    Family History  Problem Relation Age of Onset   Asthma Mother    Diabetes Mother    Hypertension Mother    Breast cancer Mother    Asthma Father    Diabetes Father    Kidney disease Father    Diabetes Sister    Asthma Sister    Asthma Brother    Diabetes Brother    Cancer Maternal Aunt    Diabetes Maternal Uncle    Diabetes Maternal Grandmother    Hypertension Maternal Grandmother    Anemia Maternal Grandmother    Heart attack Paternal Grandfather    Cancer Paternal Grandfather    Colon cancer Neg Hx    Ovarian cancer Neg Hx    Endometrial cancer Neg Hx    Pancreatic cancer Neg Hx    Prostate cancer Neg Hx     Social History   Socioeconomic History   Marital status: Single    Spouse name: Not on file   Number of children: Not on file   Years of education: Not on file   Highest education level: Not on file  Occupational History   Not on file  Tobacco Use   Smoking status: Never   Smokeless tobacco: Never  Vaping Use   Vaping Use: Never used  Substance and Sexual Activity   Alcohol use: Never   Drug use: Never   Sexual activity: Not Currently    Birth control/protection: None    Comment: female partner  Other Topics Concern   Not on file  Social History Narrative   Not on file   Social Determinants of Health   Financial Resource Strain: Not on file  Food Insecurity: Not on file  Transportation Needs: Not on file  Physical Activity: Not on file  Stress: Not on file  Social Connections: Not on file    Current Medications:  Current Outpatient Medications:    estradiol (CLIMARA) 0.05 mg/24hr patch, Place 1 patch (0.05 mg total) onto the skin once a week., Disp: 4 patch, Rfl: 12   albuterol (VENTOLIN HFA) 108 (90 Base)  MCG/ACT inhaler, Inhale 1-2 puffs into the lungs every 6 (six) hours as needed for wheezing or shortness of breath., Disp: , Rfl:    ferrous sulfate 325 (65 FE) MG tablet, Take 325 mg by mouth once a week., Disp: , Rfl:    ibuprofen (ADVIL) 200 MG tablet, Take 200 mg by mouth 2 (two) times daily as needed for headache (pain)., Disp: , Rfl:    metFORMIN (GLUCOPHAGE) 500 MG tablet, Take 1 tablet (500 mg total) by mouth 2 (two) times daily with a meal., Disp: 60 tablet, Rfl: 1   propranolol (INDERAL) 40 MG tablet, Take 40 mg by mouth daily as needed (migraines)., Disp: , Rfl:   Review of Symptoms: Pertinent positives as per HPI.  Physical Exam: There were no vitals taken for this visit. Deferred given limitations of phone visit.  Laboratory & Radiologic Studies: None new  Assessment & Plan: Alyssa Becker is a 26 y.o. 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 PDL1 10%. ER+.   The patient is having significant menopausal symptoms.  We discussed options for treatment including nonhormonal, such as Effexor, or starting estrogen replacement.  Given her young age and removal of both ovaries, I would favor her hormonal replacement.  We discussed that there are no recommendations to guide the decision given unknown origin of her cancer (endocervix versus endometrial).  The most conservative would be to avoid any hormone replacement given estrogen receptor positive status of her tumor.  I think it is reasonable to start an estrogen patch and see if we can help her hot flashes; I suggested that we start at a lower dose than what I would normally start in someone her age.  Patient voices understanding of the risk with starting estrogen replacement therapy, would like to try the estrogen patch.  I discussed the assessment and treatment plan with  the patient. The patient was provided with an opportunity to ask questions and all were answered. The patient agreed with the plan and demonstrated an understanding of the instructions.   The patient was advised to call back or see an in-person evaluation if the symptoms worsen or if the condition fails to improve as anticipated.   8 minutes of total time was spent for this patient encounter, including preparation, phone counseling with the patient and coordination of care, and documentation of the encounter.   Jeral Pinch, MD  Division of Gynecologic Oncology  Department of Obstetrics and Gynecology  [facility]

## 2021-07-15 ENCOUNTER — Encounter (HOSPITAL_COMMUNITY): Payer: Self-pay

## 2021-07-15 ENCOUNTER — Ambulatory Visit (HOSPITAL_COMMUNITY)
Admission: RE | Admit: 2021-07-15 | Discharge: 2021-07-15 | Disposition: A | Discharge status: Home / Self Care | Payer: Medicaid Other | Source: Ambulatory Visit | Attending: Gynecologic Oncology | Admitting: Gynecologic Oncology

## 2021-07-15 DIAGNOSIS — C539 Malignant neoplasm of cervix uteri, unspecified: Secondary | ICD-10-CM | POA: Diagnosis present

## 2021-07-15 LAB — GLUCOSE, CAPILLARY: Glucose-Capillary: 153 mg/dL — ABNORMAL HIGH (ref 70–99)

## 2021-07-15 MED ORDER — FLUDEOXYGLUCOSE F - 18 (FDG) INJECTION
13.6700 | Freq: Once | INTRAVENOUS | Status: AC | PRN
  Administered 2021-07-15: 13.67 via INTRAVENOUS

## 2021-07-19 ENCOUNTER — Other Ambulatory Visit: Payer: Self-pay | Admitting: Hematology and Oncology

## 2021-07-19 DIAGNOSIS — C539 Malignant neoplasm of cervix uteri, unspecified: Secondary | ICD-10-CM

## 2021-07-22 ENCOUNTER — Inpatient Hospital Stay (HOSPITAL_BASED_OUTPATIENT_CLINIC_OR_DEPARTMENT_OTHER): Payer: Medicaid Other | Admitting: Hematology and Oncology

## 2021-07-22 ENCOUNTER — Other Ambulatory Visit: Payer: Self-pay

## 2021-07-22 ENCOUNTER — Inpatient Hospital Stay: Payer: Medicaid Other

## 2021-07-22 ENCOUNTER — Inpatient Hospital Stay: Payer: Medicaid Other | Attending: Radiation Oncology

## 2021-07-22 ENCOUNTER — Encounter: Payer: Self-pay | Admitting: Hematology and Oncology

## 2021-07-22 DIAGNOSIS — C539 Malignant neoplasm of cervix uteri, unspecified: Secondary | ICD-10-CM | POA: Insufficient documentation

## 2021-07-22 DIAGNOSIS — Z90710 Acquired absence of both cervix and uterus: Secondary | ICD-10-CM | POA: Insufficient documentation

## 2021-07-22 DIAGNOSIS — D509 Iron deficiency anemia, unspecified: Secondary | ICD-10-CM

## 2021-07-22 DIAGNOSIS — Z95828 Presence of other vascular implants and grafts: Secondary | ICD-10-CM

## 2021-07-22 DIAGNOSIS — E8941 Symptomatic postprocedural ovarian failure: Secondary | ICD-10-CM

## 2021-07-22 DIAGNOSIS — N951 Menopausal and female climacteric states: Secondary | ICD-10-CM | POA: Diagnosis not present

## 2021-07-22 DIAGNOSIS — Z79890 Hormone replacement therapy (postmenopausal): Secondary | ICD-10-CM | POA: Diagnosis not present

## 2021-07-22 LAB — CBC WITH DIFFERENTIAL (CANCER CENTER ONLY)
Abs Immature Granulocytes: 0.04 10*3/uL (ref 0.00–0.07)
Basophils Absolute: 0 10*3/uL (ref 0.0–0.1)
Basophils Relative: 0 %
Eosinophils Absolute: 0.4 10*3/uL (ref 0.0–0.5)
Eosinophils Relative: 5 %
HCT: 43.4 % (ref 36.0–46.0)
Hemoglobin: 14.3 g/dL (ref 12.0–15.0)
Immature Granulocytes: 1 %
Lymphocytes Relative: 19 %
Lymphs Abs: 1.5 10*3/uL (ref 0.7–4.0)
MCH: 25.6 pg — ABNORMAL LOW (ref 26.0–34.0)
MCHC: 32.9 g/dL (ref 30.0–36.0)
MCV: 77.8 fL — ABNORMAL LOW (ref 80.0–100.0)
Monocytes Absolute: 0.5 10*3/uL (ref 0.1–1.0)
Monocytes Relative: 7 %
Neutro Abs: 5.5 10*3/uL (ref 1.7–7.7)
Neutrophils Relative %: 68 %
Platelet Count: 256 10*3/uL (ref 150–400)
RBC: 5.58 MIL/uL — ABNORMAL HIGH (ref 3.87–5.11)
RDW: 13.9 % (ref 11.5–15.5)
WBC Count: 8 10*3/uL (ref 4.0–10.5)
nRBC: 0 % (ref 0.0–0.2)

## 2021-07-22 LAB — CMP (CANCER CENTER ONLY)
ALT: 32 U/L (ref 0–44)
AST: 26 U/L (ref 15–41)
Albumin: 4.4 g/dL (ref 3.5–5.0)
Alkaline Phosphatase: 160 U/L — ABNORMAL HIGH (ref 38–126)
Anion gap: 8 (ref 5–15)
BUN: 11 mg/dL (ref 6–20)
CO2: 28 mmol/L (ref 22–32)
Calcium: 10.1 mg/dL (ref 8.9–10.3)
Chloride: 104 mmol/L (ref 98–111)
Creatinine: 0.69 mg/dL (ref 0.44–1.00)
GFR, Estimated: >60 mL/min (ref >60)
Glucose, Bld: 129 mg/dL — ABNORMAL HIGH (ref 70–99)
Potassium: 4.1 mmol/L (ref 3.5–5.1)
Sodium: 140 mmol/L (ref 135–145)
Total Bilirubin: 0.4 mg/dL (ref 0.3–1.2)
Total Protein: 7.9 g/dL (ref 6.5–8.1)

## 2021-07-22 MED ORDER — HEPARIN SOD (PORK) LOCK FLUSH 100 UNIT/ML IV SOLN
500.0000 [IU] | Freq: Once | INTRAVENOUS | Status: AC
  Administered 2021-07-22: 500 [IU]

## 2021-07-22 MED ORDER — SODIUM CHLORIDE 0.9% FLUSH
10.0000 mL | Freq: Once | INTRAVENOUS | Status: AC
  Administered 2021-07-22: 10 mL

## 2021-07-22 NOTE — Progress Notes (Signed)
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   All questions were answered. The patient knows to call the clinic with any problems, questions or concerns. The total time spent in the appointment was 20 minutes encounter with patients including review of chart and various tests results, discussions about plan of care and coordination of care plan   [name], MD 07/22/2021 10:33 AM  INTERVAL HISTORY: Please see below for problem oriented charting. she returns for surveillance follow-up after completion hysterectomy for uterine cancer She has hot flashes Otherwise, she is not symptomatic  REVIEW OF SYSTEMS:   Constitutional: Denies fevers, chills or abnormal weight loss Eyes: Denies blurriness of vision Ears, nose, mouth, throat, and face: Denies mucositis or sore throat Respiratory: Denies cough, dyspnea or wheezes Cardiovascular: Denies palpitation, chest discomfort or lower extremity swelling Gastrointestinal:  Denies nausea, heartburn or change in bowel habits Skin: Denies abnormal skin rashes Lymphatics: Denies new lymphadenopathy or  easy bruising Neurological:Denies numbness, tingling or new weaknesses Behavioral/Psych: Mood is stable, no new changes  All other systems were reviewed with the patient and are negative.  I have reviewed the past medical history, past surgical history, social history and family history with the patient and they are unchanged from previous note.  ALLERGIES:  is allergic to pineapple and prednisone.  MEDICATIONS:  Current Outpatient Medications  Medication Sig Dispense Refill   albuterol (VENTOLIN HFA) 108 (90 Base) MCG/ACT inhaler Inhale 1-2 puffs into the lungs every 6 (six) hours as needed for wheezing or shortness of breath.     estradiol (CLIMARA) 0.05 mg/24hr patch Place 1 patch (0.05 mg total) onto the skin once a week. 4 patch 12   ferrous sulfate 325 (65 FE) MG tablet Take 325 mg by mouth once a week.     ibuprofen (ADVIL) 200 MG tablet Take 200 mg by mouth 2 (two) times daily as needed for headache (pain).     metFORMIN (GLUCOPHAGE) 500 MG tablet Take 1 tablet (500 mg total) by mouth 2 (two) times daily with a meal. 60 tablet 1   propranolol (INDERAL) 40 MG tablet Take 40 mg by mouth daily as needed (migraines).     No current facility-administered medications for this visit.    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 Large indeterminate right adnexal mass with solid hypervascular and cystic components. This appears separate from the base of the appendix on  preceding CT and is likely arising from the right ovary based on relationship to the gonadal vessels on CT. Given the patient's young age, hypervascularity and leukocytosis, findings could represent an atypical tubo-ovarian abscess. However, the appearance is concerning for ovarian malignancy despite the patient's  young age. Recommend prompt gynecology consultation. Pelvic MRI without and with contrast may be helpful for further evaluation. 2. The left ovary appears normal. Mild nonspecific thickening of the endometrium, within physiologic limits.   01/02/2021 Pathology Results   SPECIMEN ADEQUACY: Satisfactory for evaluation; transformation zone component PRESENT. INTERPRETATION: - Adenocarcinoma, NOS   01/15/2021 Imaging   MRI pelvis  13.0 cm cystic and solid mass arising from the right ovary, highly suspicious for ovarian carcinoma.   Mild endometrial thickening and 4.5 cm soft tissue mass within the endocervical canal. Differential diagnosis includes prolapsing endometrial polyp or carcinoma, or primary cervical carcinoma.   No evidence of pelvic metastatic disease.   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 Large cystic/necrotic right adnexal mass is markedly hypermetabolic and consistent with known neoplasm. 2. Extensive hypermetabolic tumor involving the endometrium and cervix. 3. Scattered borderline enlarged and mildly hypermetabolic retroperitoneal and external iliac nodes. 4. No findings for metastatic disease involving the chest or bony structures.   01/24/2021 Surgery   Pre-operative Diagnosis: Complex adnexal mass, elevated CA-125, locally invasive cervical adenocarcinoma versus endometrial adenocarcinoma   Post-operative Diagnosis: same, high-grade carcinoma of the right ovary, suspected history of superinfection of the adnexal mass   Operation: Robotic-assisted laparoscopic bilateral salpingectomy, right oophorectomy, lysis of adhesions of approximately 45 minutes, right ureterolysis   Surgeon: Jeral Pinch MD    Operative Findings: On EUA, cervix enlarged and firm, fleshy tumor replacing the endocervix.  No definitive parametrial involvement appreciated.  Even gentle exam because significant bleeding so vagina was packed  with lap sponges for the remainder of surgery.  On intra-abdominal exam, normal upper abdominal survey including omentum, liver edge, diaphragm, and stomach.  Normal small bowel.  Some physiologic adhesions of the sigmoid epiploica to the left sidewall.  Normal-appearing left ovary measuring approximately 3-4 cm.  Normal-appearing bilateral fallopian tubes.  Uterus approximately 8-10 cm and overall normal in appearance.  Right ovary replaced by a smooth but vascular 10 cm mass with multiple loculations.  Ovary with dense adhesions to the right pelvic sidewall.  Upon very careful manipulation of the mass for its removal, there was some drainage of fluid noted that was somewhat purulent.  This was sent for culture and Gram stain.  Upon contained mass removal with fractionation of the mass, tumor versus necrotic material was noted.  Frozen section consistent with high-grade carcinoma, unable to differentiate on frozen section whether this is the same cancer being seen on her cervical biopsies.  Right ureterolysis performed given oozing services from the broad ligament and peritoneum where adhesions were lysed between the ovary and peritoneum, to assure ureter not in close proximity.  No obvious adenopathy.  No ascites.  Left ovary transposed out of the pelvis along the left gutter, clips placed along the sidewall as well as the superior and inferior aspect of the ovary.     01/24/2021 Pathology Results   FINAL MICROSCOPIC DIAGNOSIS:   A. OVARY AND FALLOPIAN TUBE, RIGHT, SALPINGO OOPHORECTOMY:  - Endometrioid carcinoma, moderately differentiated, involving right ovary  - No evidence of ovarian surface involvement by carcinoma  - Segment of fallopian tube, negative for carcinoma  - See oncology table  -  See comment   B. FALLOPIAN TUBE, LEFT, SALPINGECTOMY:  - Segment of fallopian tube, negative for carcinoma   A.  Immunostain for p16 shows patchy staining.  Immunostain for p53 shows a normal, wild-type  expression pattern.  This immunoprofile is consistent with above interpretation.  Patient's previous cervical  biopsy was reviewed - it shows moderately differentiated carcinoma with a very similar histomorphology and very likely presents metastasis from the ovarian tumor to the cervix.    02/05/2021 Cancer Staging   Staging form: Cervix Uteri, AJCC Version 9 - Pathologic stage from 02/05/2021: FIGO Stage II (pT2, pN0, cM0) - Signed by [name], MD on 02/05/2021 Stage prefix: Initial diagnosis   02/14/2021 Procedure   Successful placement of a right internal jugular approach power injectable Port-A-Cath. The catheter is ready for immediate use   02/22/2021 - 03/22/2021 Chemotherapy   Patient is on Treatment Plan : Cervical cancer Cisplatin q7d     02/25/2021 - 03/29/2021 Radiation Therapy   Radiation Treatment Dates: 02/25/2021 through 04/09/2021 Pelvic IMRT: 02/25/21 through 03/29/21 HDR-brachy: 04/09/21 Site Technique Total Dose (Gy) Dose per Fx (Gy) Completed Fx Beam Energies  Pelvis: Pelvis IMRT 45/45 1.8 25/25 6X  Cervix: Cervix_Bst_Fx1 HDR-brachy 5.5/5.5 5.5 1/1 Ir-192         05/13/2021 Imaging   1. Status post bilateral oophorectomy. Previously seen large hypermetabolic right adnexal mass has been resected. Nodular soft tissue in the vicinity of both the left and right oophorectomy bed, likely postoperative in nature. Attention on follow-up. 2. Previously noted uterine/cervical mass is resolved, consistent with treatment response. 3. Prominent, previously FDG avid iliac and retroperitoneal lymph nodes are slightly diminished in size, consistent with treatment response. 4. Hypodense liver lesion of hepatic segment IVa measuring 1.5 x 1.2 cm, not previously appreciated by contrast enhanced CT or PET-CT, incompletely characterized although worrisome for hepatic metastasis. This could be further characterized by multiphasic contrast enhanced MRI. 5. Hepatomegaly and hepatic steatosis. 6.  Splenomegaly.     05/21/2021 Surgery   Robotic-assisted modified radical hysterectomy with left oophorectomy, cystoscopy  Findings:  On EUA, some radiation changes noted with deviation of the cervix posteriorly. On intra-abdominal entry, normal upper abdominal survey. Normal omentum, small and large bowel. Surgically absent right adnexa. Left ovary pexed along sidewall above the pelvic brim (normal in appearance). Uterus 8-10 cm and somewhat bulbous. Significant retroperitoneal edema, some fibrosis. 2-3 cm tumor implant along the right parametrium and distal uterosacral ligament, free from the cervix itself. No obvious paracervical tumor extension. No gross adenopathy. No ascites.  On cystoscopy, bladder intact, good efflux noted from bilateral ureteral orifices.   05/21/2021 Pathology Results   A. UTERUS AND CERVIX, HYSTERECTOMY:  - Microscopic fragment in the upper endocervical canal suspicious for  treated adenocarcinoma associated with extensive treatment response.  - Fibrosis and cytologic atypia in the endometrium consistent with  treatment effects.  - See comment.   B. PERIMETRIUM, RIGHT, EXCISION  - Amorphous material associated with histiocytic response.  - Connective tissue with inflammation, fibrosis and dystrophic  calcifications.  - No malignancy identified.   C. OVARY, LEFT, OOPHORECTOMY:  - Benign ovary.  - No malignancy identified.   COMMENT:  There is a 1.7 cm polypoid mass involving the anterior endocervical canal which histologically shows dense fibrosis with scattered glands with cytologic atypia consistent with therapy effects.  This area has a microscopic, partially detached fragment with marked atypia and architectural changes suspicious for microscopic residual adenocarcinoma.  Elsewhere, the endometrium shows foci of similar fibrosis  with patchy cytologic atypia consistent with therapy effects.  There are foci where fibrosis extends into the adjacent upper portions  of the myometrium.  There is insufficient residual suspicious tissue present to resolve the location of the primary in this case.  The pattern of fibrotic apparent therapy affects is more suggestive of an ovarian metastasis from an endocervical or endometrial primary.    07/16/2021 PET scan   1. Interval hysterectomy with mild residual uptake in the surgical bed. This examination will serve as baseline for future comparison. No evidence of distant metastatic disease. 2. Relatively patchy/mottled uptake throughout the spine may be treatment related. No definite focal lesion. 3. Severely enlarged steatotic liver. 4. Splenomegaly.     PHYSICAL EXAMINATION: ECOG PERFORMANCE STATUS: 0 - Asymptomatic  Vitals:   07/22/21 1023  BP: (!) 134/91  Pulse: 98  Resp: 18  Temp: 99.6 F (37.6 C)  SpO2: 100%   Filed Weights   07/22/21 1023  Weight: 284 lb 3.2 oz (128.9 kg)    GENERAL:alert, no distress and comfortable NEURO: alert & oriented x 3 with fluent speech, no focal motor/sensory deficits  LABORATORY DATA:  I have reviewed the data as listed    Component Value Date/Time   NA 139 05/17/2021 1157   K 3.7 05/17/2021 1157   CL 107 05/17/2021 1157   CO2 26 05/17/2021 1157   GLUCOSE 119 (H) 05/17/2021 1157   BUN 11 05/17/2021 1157   CREATININE 0.63 05/17/2021 1157   CREATININE 0.60 03/27/2021 0936   CALCIUM 9.4 05/17/2021 1157   PROT 7.7 05/17/2021 1157   ALBUMIN 3.9 05/17/2021 1157   AST 32 05/17/2021 1157   ALT 39 05/17/2021 1157   ALKPHOS 141 (H) 05/17/2021 1157   BILITOT 0.3 05/17/2021 1157   GFRNONAA >60 05/17/2021 1157   GFRNONAA >60 03/27/2021 0936   GFRAA >60 05/18/2019 1957    No results found for: "SPEP", "UPEP"  Lab Results  Component Value Date   WBC 8.0 07/22/2021   NEUTROABS 5.5 07/22/2021   HGB 14.3 07/22/2021   HCT 43.4 07/22/2021   MCV 77.8 (L) 07/22/2021   PLT 256 07/22/2021      Chemistry      Component Value Date/Time   NA 139 05/17/2021 1157    K 3.7 05/17/2021 1157   CL 107 05/17/2021 1157   CO2 26 05/17/2021 1157   BUN 11 05/17/2021 1157   CREATININE 0.63 05/17/2021 1157   CREATININE 0.60 03/27/2021 0936      Component Value Date/Time   CALCIUM 9.4 05/17/2021 1157   ALKPHOS 141 (H) 05/17/2021 1157   AST 32 05/17/2021 1157   ALT 39 05/17/2021 1157   BILITOT 0.3 05/17/2021 1157

## 2021-07-22 NOTE — Assessment & Plan Note (Signed)
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

## 2021-07-22 NOTE — Assessment & Plan Note (Signed)
She was prescribed hormone replacement therapy She has not started that yet I encouraged the patient to try

## 2021-07-22 NOTE — Assessment & Plan Note (Signed)
Her port is not giving venous return I will schedule another port flush When she sees GYN surgeon If it continues to malfunction, we will get it removed

## 2021-09-04 ENCOUNTER — Encounter: Payer: Self-pay | Admitting: Gynecologic Oncology

## 2021-09-09 ENCOUNTER — Encounter: Payer: Self-pay | Admitting: Gynecologic Oncology

## 2021-09-09 ENCOUNTER — Other Ambulatory Visit: Payer: Self-pay

## 2021-09-09 ENCOUNTER — Inpatient Hospital Stay: Payer: Medicaid Other

## 2021-09-09 ENCOUNTER — Inpatient Hospital Stay: Payer: Medicaid Other | Attending: Radiation Oncology | Admitting: Gynecologic Oncology

## 2021-09-09 VITALS — BP 139/90 | HR 99 | Temp 98.1°F | Resp 19 | Wt 296.2 lb

## 2021-09-09 DIAGNOSIS — Z9221 Personal history of antineoplastic chemotherapy: Secondary | ICD-10-CM | POA: Diagnosis not present

## 2021-09-09 DIAGNOSIS — F419 Anxiety disorder, unspecified: Secondary | ICD-10-CM | POA: Diagnosis not present

## 2021-09-09 DIAGNOSIS — C539 Malignant neoplasm of cervix uteri, unspecified: Secondary | ICD-10-CM

## 2021-09-09 DIAGNOSIS — Z90710 Acquired absence of both cervix and uterus: Secondary | ICD-10-CM | POA: Insufficient documentation

## 2021-09-09 DIAGNOSIS — Z923 Personal history of irradiation: Secondary | ICD-10-CM | POA: Insufficient documentation

## 2021-09-09 DIAGNOSIS — Z452 Encounter for adjustment and management of vascular access device: Secondary | ICD-10-CM | POA: Diagnosis not present

## 2021-09-09 DIAGNOSIS — F32A Depression, unspecified: Secondary | ICD-10-CM | POA: Diagnosis not present

## 2021-09-09 DIAGNOSIS — D509 Iron deficiency anemia, unspecified: Secondary | ICD-10-CM

## 2021-09-09 DIAGNOSIS — N951 Menopausal and female climacteric states: Secondary | ICD-10-CM | POA: Diagnosis not present

## 2021-09-09 DIAGNOSIS — Z79890 Hormone replacement therapy (postmenopausal): Secondary | ICD-10-CM | POA: Insufficient documentation

## 2021-09-09 DIAGNOSIS — E119 Type 2 diabetes mellitus without complications: Secondary | ICD-10-CM | POA: Diagnosis not present

## 2021-09-09 DIAGNOSIS — C579 Malignant neoplasm of female genital organ, unspecified: Secondary | ICD-10-CM

## 2021-09-09 DIAGNOSIS — Z803 Family history of malignant neoplasm of breast: Secondary | ICD-10-CM | POA: Insufficient documentation

## 2021-09-09 MED ORDER — SODIUM CHLORIDE 0.9% FLUSH
10.0000 mL | Freq: Once | INTRAVENOUS | Status: AC
  Administered 2021-09-09: 10 mL

## 2021-09-09 MED ORDER — ESTRADIOL 0.05 MG/24HR TD PTTW: 1.0000 | MEDICATED_PATCH | TRANSDERMAL | 12 refills | Status: DC

## 2021-09-09 MED ORDER — HEPARIN SOD (PORK) LOCK FLUSH 100 UNIT/ML IV SOLN
500.0000 [IU] | Freq: Once | INTRAVENOUS | Status: AC
  Administered 2021-09-09: 500 [IU]

## 2021-09-09 NOTE — Progress Notes (Signed)
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 Large indeterminate right adnexal mass with solid hypervascular and cystic components. This appears separate from the base of the appendix on preceding CT and is likely arising from the right ovary based on relationship to the gonadal vessels on CT. Given the patient's young age, hypervascularity and leukocytosis, findings could represent an atypical tubo-ovarian abscess. However, the appearance is concerning for ovarian malignancy despite the patient's young age. Recommend prompt gynecology consultation. Pelvic MRI without and with contrast may be helpful for further evaluation. 2. The left ovary appears normal. Mild nonspecific thickening of the endometrium, within physiologic limits.   01/02/2021 Pathology Results   SPECIMEN ADEQUACY: Satisfactory for evaluation; transformation zone component PRESENT. INTERPRETATION: - Adenocarcinoma, NOS   01/15/2021 Imaging   MRI pelvis  13.0 cm cystic and solid mass arising from the right ovary, highly suspicious for ovarian carcinoma.   Mild endometrial thickening and 4.5 cm soft tissue mass within the endocervical canal. Differential diagnosis includes prolapsing endometrial polyp or carcinoma, or primary cervical carcinoma.   No evidence of pelvic metastatic disease.   01/16/2021 Initial Diagnosis   Malignant neoplasm of cervix ([place])   01/22/2021 PET scan   1. Large cystic/necrotic right adnexal mass is markedly hypermetabolic and consistent with known neoplasm. 2. Extensive hypermetabolic tumor involving the endometrium and cervix. 3. Scattered borderline enlarged and mildly  hypermetabolic retroperitoneal and external iliac nodes. 4. No findings for metastatic disease involving the chest or bony structures.   01/24/2021 Surgery   Pre-operative Diagnosis: Complex adnexal mass, elevated CA-125, locally invasive cervical adenocarcinoma versus endometrial adenocarcinoma   Post-operative Diagnosis: same, high-grade carcinoma of the right ovary, suspected history of superinfection of the adnexal mass   Operation: Robotic-assisted laparoscopic bilateral salpingectomy, right oophorectomy, lysis of adhesions of approximately 45 minutes, right ureterolysis   Surgeon: Jeral Pinch MD    Operative Findings: On EUA, cervix enlarged and firm, fleshy tumor replacing the endocervix.  No definitive parametrial involvement appreciated.  Even gentle exam because significant bleeding so vagina was packed with lap sponges for the remainder of surgery.  On intra-abdominal exam, normal upper abdominal survey including omentum, liver edge, diaphragm, and stomach.  Normal small bowel.  Some physiologic adhesions of the sigmoid epiploica to the left sidewall.  Normal-appearing left ovary measuring approximately 3-4 cm.  Normal-appearing bilateral fallopian tubes.  Uterus approximately 8-10 cm and overall normal in appearance.  Right ovary replaced by a smooth but vascular 10 cm mass with multiple loculations.  Ovary with dense adhesions to the right pelvic sidewall.  Upon very careful manipulation of the mass for its removal, there was some drainage of fluid noted that was somewhat purulent.  This was sent for culture and Gram stain.  Upon contained mass removal with fractionation of the mass, tumor versus necrotic material was noted.  Frozen section consistent with high-grade carcinoma, unable to differentiate on frozen section whether this is the same cancer being seen on her cervical biopsies.  Right ureterolysis performed given oozing services from the broad ligament and peritoneum where  adhesions were lysed between the ovary and peritoneum, to assure ureter not in close proximity.  No obvious adenopathy.  No ascites.  Left ovary transposed out of the pelvis along the left gutter, clips placed along the sidewall as well as the superior and inferior aspect of the ovary.     01/24/2021 Pathology Results   FINAL MICROSCOPIC DIAGNOSIS:   A. OVARY AND FALLOPIAN TUBE, RIGHT, SALPINGO OOPHORECTOMY:  - Endometrioid carcinoma, moderately differentiated, involving right ovary  - No evidence of ovarian surface involvement by carcinoma  - Segment of fallopian tube, negative for carcinoma  - See oncology table  - See comment   B. FALLOPIAN TUBE, LEFT, SALPINGECTOMY:  - Segment of fallopian tube, negative for carcinoma   A.  Immunostain for p16 shows patchy staining.  Immunostain for p53 shows a normal, wild-type expression pattern.  This immunoprofile is consistent with above interpretation.  Patient's previous cervical  biopsy was reviewed - it shows moderately differentiated carcinoma with a very similar histomorphology and very likely presents metastasis from the ovarian tumor to the cervix.    02/05/2021 Cancer Staging   Staging form: Cervix Uteri, AJCC Version 9 - Pathologic stage from 02/05/2021: FIGO Stage II (pT2, pN0, cM0) - Signed by [name], MD on 02/05/2021 Stage prefix: Initial diagnosis   02/14/2021 Procedure   Successful placement of a right internal jugular approach power injectable Port-A-Cath. The catheter is ready for immediate use   02/22/2021 - 03/22/2021 Chemotherapy   Patient is on Treatment Plan : Cervical cancer Cisplatin q7d     02/25/2021 - 03/29/2021 Radiation Therapy   Radiation Treatment Dates: 02/25/2021 through 04/09/2021 Pelvic IMRT: 02/25/21 through 03/29/21 HDR-brachy: 04/09/21 Site Technique Total Dose (Gy) Dose per Fx (Gy) Completed Fx Beam Energies  Pelvis: Pelvis IMRT 45/45 1.8 25/25 6X  Cervix: Cervix_Bst_Fx1 HDR-brachy 5.5/5.5 5.5 1/1 Ir-192          05/13/2021 Imaging   1. Status post bilateral oophorectomy. Previously seen large hypermetabolic right adnexal mass has been resected. Nodular soft tissue in the vicinity of both the left and right oophorectomy bed, likely postoperative in nature. Attention on follow-up. 2. Previously noted uterine/cervical mass is resolved, consistent with treatment response. 3. Prominent, previously FDG avid iliac and retroperitoneal lymph nodes are slightly diminished in size, consistent with treatment response. 4. Hypodense liver lesion of hepatic segment IVa measuring 1.5 x 1.2 cm, not previously appreciated by contrast enhanced CT or PET-CT, incompletely characterized although worrisome for hepatic metastasis. This could be further characterized by multiphasic contrast enhanced MRI. 5. Hepatomegaly and hepatic steatosis. 6. Splenomegaly.     05/21/2021 Surgery   Robotic-assisted modified radical hysterectomy with left oophorectomy, cystoscopy  Findings:  On EUA, some radiation changes noted with deviation of the cervix posteriorly. On intra-abdominal entry, normal upper abdominal survey. Normal omentum, small and large bowel. Surgically absent right adnexa. Left ovary pexed along sidewall above the pelvic brim (normal in appearance). Uterus 8-10 cm and somewhat bulbous. Significant retroperitoneal edema, some fibrosis. 2-3 cm tumor implant along the right parametrium and distal uterosacral ligament, free from the cervix itself. No obvious paracervical tumor extension. No gross adenopathy. No ascites.  On cystoscopy, bladder intact, good efflux noted from bilateral ureteral orifices.   05/21/2021 Pathology Results   A. UTERUS AND CERVIX, HYSTERECTOMY:  - Microscopic fragment in the upper endocervical canal suspicious for  treated adenocarcinoma associated with extensive treatment response.  - Fibrosis and cytologic atypia in the endometrium consistent with  treatment effects.  - See comment.   B.  PERIMETRIUM, RIGHT, EXCISION  - Amorphous material associated with histiocytic response.  - Connective tissue with inflammation, fibrosis  and dystrophic  calcifications.  - No malignancy identified.   C. OVARY, LEFT, OOPHORECTOMY:  - Benign ovary.  - No malignancy identified.   COMMENT:  There is a 1.7 cm polypoid mass involving the anterior endocervical canal which histologically shows dense fibrosis with scattered glands with cytologic atypia consistent with therapy effects.  This area has a microscopic, partially detached fragment with marked atypia and architectural changes suspicious for microscopic residual adenocarcinoma.  Elsewhere, the endometrium shows foci of similar fibrosis with patchy cytologic atypia consistent with therapy effects.  There are foci where fibrosis extends into the adjacent upper portions of the myometrium.  There is insufficient residual suspicious tissue present to resolve the location of the primary in this case.  The pattern of fibrotic apparent therapy affects is more suggestive of an ovarian metastasis from an endocervical or endometrial primary.    07/16/2021 PET scan   1. Interval hysterectomy with mild residual uptake in the surgical bed. This examination will serve as baseline for future comparison. No evidence of distant metastatic disease. 2. Relatively patchy/mottled uptake throughout the spine may be treatment related. No definite focal lesion. 3. Severely enlarged steatotic liver. 4. Splenomegaly.     Interval History: Doing well.  Denies any abdominal or pelvic pain.  Has some pain around her port.  Reports normal bowel and bladder function.  Denies any vaginal bleeding or discharge.  Continues to have hot flashes, not very bothersome.  Has noted some mood changes including anxiety and depression.  Tried to use the hormonal patch, did not stick to her skin well.  Past Medical/Surgical History: Past Medical History:  Diagnosis Date   Anxiety     Asthma    Depression    History of chemotherapy    02-22-2021  to 03-22-2021  for cervical cancer   History of COVID-19 02/2020   followed by pcp   (04-03-2021  per pt checks blood sugar once weekly)   History of external beam radiation therapy    02-25-2021  to 03-29-2021 for cervical cancer   History of radiation therapy    cervix - tandem and ring 04/09/2021  Dr Gery Pray   Iron deficiency anemia due to chronic blood loss    Malignant neoplasm cervix [facility])    oncologist--- dr gorsuch// radiation onologist-- dr Sondra Come;   dx 01-16-2021;   01-24-2021  s/p robotic RSO and left salpingectomy for right ovarian mass (endometrioid carcinoma);  completed chemo 03-22-2021 and IMRT 03-29-2021 (scheduled for high dose brachytherapy 04-09-2021)   Migraines    migraine   Morbid obesity ([place])    Type 2 diabetes mellitus ([place])     Past Surgical History:  Procedure Laterality Date   IR IMAGING GUIDED PORT INSERTION  02/13/2021   OPERATIVE ULTRASOUND N/A 04/09/2021   Procedure: OPERATIVE ULTRASOUND;  Surgeon: Gery Pray, MD;  Location: [place];  Service: Urology;  Laterality: N/A;   ROBOTIC ASSISTED TOTAL HYSTERECTOMY N/A 05/21/2021   Procedure: XI ROBOTIC ASSISTED TOTAL HYSTERECTOMY;  Surgeon: [name], MD;  Location: [facility];  Service: Gynecology;  Laterality: N/A;   TANDEM RING INSERTION N/A 04/09/2021   Procedure: TANDEM RING INSERTION;  Surgeon: Gery Pray, MD;  Location: [facility];  Service: Urology;  Laterality: N/A;   TONSILLECTOMY     child   XI ROBOTIC ASSISTED OOPHORECTOMY N/A 01/24/2021   Procedure: XI ROBOTIC ASSISTED RIGHT OOPHORECTOMY, OOPHOROPEXY OF LEFT OVARY;  Surgeon: [name], MD;  Location: [facility];  Service: Gynecology;  Laterality: N/A;   XI ROBOTIC ASSISTED OOPHORECTOMY Left 05/21/2021   Procedure: XI ROBOTIC ASSISTED LEFT OOPHORECTOMY;  Surgeon: [name], MD;  Location: [facility];  Service: Gynecology;   Laterality: Left;   XI ROBOTIC ASSISTED SALPINGECTOMY Bilateral 01/24/2021   Procedure: XI ROBOTIC ASSISTED SALPINGECTOMY; RIGHT URETERAL LYSIS; LYSIS OF ADHESIONS;  Surgeon: [name], MD;  Location: [facility];  Service: Gynecology;  Laterality: Bilateral;    Family History  Problem Relation Age of Onset   Asthma Mother    Diabetes Mother    Hypertension Mother    Breast cancer Mother    Asthma Father    Diabetes Father    Kidney disease Father    Diabetes Sister    Asthma Sister    Asthma Brother    Diabetes Brother    Cancer Maternal Aunt    Diabetes Maternal Uncle    Diabetes Maternal Grandmother    Hypertension Maternal Grandmother    Anemia Maternal Grandmother    Heart attack Paternal Grandfather    Cancer Paternal Grandfather    Colon cancer Neg Hx    Ovarian cancer Neg Hx    Endometrial cancer Neg Hx    Pancreatic cancer Neg Hx    Prostate cancer Neg Hx     Social History   Socioeconomic History   Marital status: Single    Spouse name: Not on file   Number of children: Not on file   Years of education: Not on file   Highest education level: Not on file  Occupational History   Not on file  Tobacco Use   Smoking status: Never   Smokeless tobacco: Never  Vaping Use   Vaping Use: Never used  Substance and Sexual Activity   Alcohol use: Never   Drug use: Never   Sexual activity: Not Currently    Birth control/protection: None    Comment: female partner  Other Topics Concern   Not on file  Social History Narrative   Not on file   Social Determinants of Health   Financial Resource Strain: Not on file  Food Insecurity: Not on file  Transportation Needs: Not on file  Physical Activity: Not on file  Stress: Not on file  Social Connections: Not on file    Current Medications:  Current Outpatient Medications:    albuterol (VENTOLIN HFA) 108 (90 Base) MCG/ACT inhaler, Inhale 1-2 puffs into the lungs every 6 (six) hours as needed for wheezing  or shortness of breath., Disp: , Rfl:    estradiol (VIVELLE-DOT) 0.05 MG/24HR patch, Place 1 patch (0.05 mg total) onto the skin 2 (two) times a week., Disp: 8 patch, Rfl: 12   ferrous sulfate 325 (65 FE) MG tablet, Take 325 mg by mouth once a week., Disp: , Rfl:    ibuprofen (ADVIL) 200 MG tablet, Take 200 mg by mouth 2 (two) times daily as needed for headache (pain)., Disp: , Rfl:    metFORMIN (GLUCOPHAGE) 500 MG tablet, Take 1 tablet (500 mg total) by mouth 2 (two) times daily with a meal., Disp: 60 tablet, Rfl: 1   propranolol (INDERAL) 40 MG tablet, Take 40 mg by mouth daily as needed (migraines)., Disp: , Rfl:   Review of Systems: + Weight gain, hot flashes, anxiety, depression. Denies appetite changes, fevers, chills, fatigue. Denies hearing loss, neck lumps or masses, mouth sores, ringing in ears or voice changes. Denies cough or wheezing.  Denies shortness of breath. Denies chest pain or palpitations. Denies leg swelling. Denies  abdominal distention, pain, blood in stools, constipation, diarrhea, nausea, vomiting, or early satiety. Denies pain with intercourse, dysuria, frequency, hematuria or incontinence. Denies hot flashes, pelvic pain, vaginal bleeding or vaginal discharge.   Denies joint pain, back pain or muscle pain/cramps. Denies itching, rash, or wounds. Denies dizziness, headaches, numbness or seizures. Denies swollen lymph nodes or glands, denies easy bruising or bleeding. Denies confusion, or decreased concentration.  Physical Exam: BP (!) 139/90 (BP Location: Left Arm, Patient Position: Sitting)   Pulse 99   Temp 98.1 F (36.7 C) (Oral)   Resp 19   Wt 296 lb 3 oz (134.3 kg)   LMP 03/25/2021 (Approximate)   SpO2 99%   BMI 47.81 kg/m  General: Alert, oriented, no acute distress. HEENT: Normocephalic, atraumatic, sclera anicteric. Chest: Clear to auscultation bilaterally.  No wheezes or rhonchi. Cardiovascular: Regular rate and rhythm, no murmurs. Abdomen:  Obese, soft, nontender.  Normoactive bowel sounds.  No masses or hepatosplenomegaly appreciated.  Well-healed incisions. Extremities: Grossly normal range of motion.  Warm, well perfused.  No edema bilaterally. Skin: No rashes or lesions noted. GU: Normal appearing external genitalia without erythema, excoriation, or lesions.  Speculum exam reveals somewhat atrophic vaginal mucosa with radiation changes present.  Cuff intact, no lesions visible.  Bimanual exam reveals cuff intact, no masses or nodularity.  This is confirmed on rectovaginal exam.   Laboratory & Radiologic Studies: PET 07/15/21: 1. Interval hysterectomy with mild residual uptake in the surgical bed. This examination will serve as baseline for future comparison. No evidence of distant metastatic disease. 2. Relatively patchy/mottled uptake throughout the spine may be treatment related. No definite focal lesion. 3. Severely enlarged steatotic liver. 4. Splenomegaly.  Assessment & Plan: Alyssa Becker is a 26 y.o. 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 PDL1 10%.   Patient is overall doing well and is NED on exam.  Plan for CT 3 months after PET. If negative for metastatic disease, plan to remove port.    Given young age and hot flashes, discussed starting low-medium dose hormone replacement therapy.  Patient had ultimately tried to start hormone replacement but the patch did not stick well to her skin.  She called my office although I do not see any phone notes about this and I was never told.  I am sending in a new prescription for a patch that hopefully will stick to her skin better.  I have asked her to let me know if it does not so that we can transition to oral hormonal replacement.   Per NCCN surveillance recommendations, we will continue with visits every 3  months.  These will include a pelvic exam.  Pap test and HPV testing will performed yearly.  Imaging will be performed for any exam findings or new symptoms.  28 minutes of total time was spent for this patient encounter, including preparation, face-to-face counseling with the patient and coordination of care, and documentation of the encounter.  Jeral Pinch, MD  Division of Gynecologic Oncology  Department of Obstetrics and Gynecology  [facility]

## 2021-09-09 NOTE — Patient Instructions (Addendum)
It was good to see you today.  I do not see or feel any evidence of cancer recurrence on your exam.  We will plan to see you in 3 months.  Please call if you develop any new symptoms before your next visit.  I will call you after your CT scan in September.  If things look good from a cancer standpoint, we can get the port taken out.  I sent a new prescription for a patch to the pharmacy.  Please let me know if there are issues with this sticking to your skin.  If there are, we will use a pill for your hormone replacement.  [facility] at [place], [place], [place] [address] Hours:  Open ? Closes 6?PM Phone: [phone]

## 2021-10-14 ENCOUNTER — Ambulatory Visit (HOSPITAL_COMMUNITY)
Admission: RE | Admit: 2021-10-14 | Discharge: 2021-10-14 | Disposition: A | Discharge status: Home / Self Care | Payer: Medicaid Other | Source: Ambulatory Visit | Attending: Gynecologic Oncology | Admitting: Gynecologic Oncology

## 2021-10-14 DIAGNOSIS — C579 Malignant neoplasm of female genital organ, unspecified: Secondary | ICD-10-CM | POA: Diagnosis present

## 2021-10-14 LAB — POCT I-STAT CREATININE: Creatinine, Ser: 0.7 mg/dL (ref 0.44–1.00)

## 2021-10-14 MED ORDER — IOHEXOL 9 MG/ML PO SOLN: 1000.0000 mL | ORAL | Status: AC

## 2021-10-14 MED ORDER — IOHEXOL 300 MG/ML  SOLN
100.0000 mL | Freq: Once | INTRAMUSCULAR | Status: AC | PRN
  Administered 2021-10-14: 100 mL via INTRAVENOUS

## 2021-10-14 MED ORDER — IOHEXOL 9 MG/ML PO SOLN: 1000.0000 mL | Freq: Once | ORAL | Status: DC

## 2021-10-14 MED ORDER — IOHEXOL 9 MG/ML PO SOLN
ORAL | Status: AC
  Administered 2021-10-14: 1000 mL
  Filled 2021-10-14: qty 1000

## 2021-10-15 ENCOUNTER — Telehealth: Payer: Self-pay

## 2021-10-15 ENCOUNTER — Other Ambulatory Visit: Payer: Self-pay | Admitting: Hematology and Oncology

## 2021-10-15 DIAGNOSIS — C539 Malignant neoplasm of cervix uteri, unspecified: Secondary | ICD-10-CM

## 2021-10-15 NOTE — Telephone Encounter (Signed)
Called back and given given below message. She verbalized understanding and appt canceled for port lab flush. Given IR phone # and updated her mobile #.

## 2021-10-15 NOTE — Telephone Encounter (Signed)
Called and left a message for Gwendola to call the office back. Dr. Alvy Bimler has placed a order for port removal. Canceled port lab flush appt for 10/20.

## 2021-10-24 ENCOUNTER — Telehealth: Payer: Self-pay

## 2021-10-24 NOTE — Telephone Encounter (Signed)
Returned her call. She is verifying if she needs to keep appt with Dr. Alvy Bimler this month and Dr. Berline Lopes in November. Port removal is scheduled on 10/9. Told her I would call her back tomorrow.

## 2021-10-25 ENCOUNTER — Other Ambulatory Visit: Payer: Self-pay | Admitting: Radiology

## 2021-10-25 ENCOUNTER — Encounter: Payer: Self-pay | Admitting: Hematology and Oncology

## 2021-10-25 NOTE — Telephone Encounter (Signed)
Once her port is removed, she does not need to see me, but she needs to see Dr. Berline Lopes long term

## 2021-10-25 NOTE — Telephone Encounter (Signed)
Called and left a message asking her to call the office back. 

## 2021-10-25 NOTE — Telephone Encounter (Signed)
She called back. Given below message from Dr. Alvy Bimler, appt canceled with Dr. Alvy Bimler. She verbalized understanding and appreciated the call.

## 2021-10-28 ENCOUNTER — Other Ambulatory Visit: Payer: Self-pay

## 2021-10-28 ENCOUNTER — Other Ambulatory Visit: Payer: Self-pay | Admitting: Hematology and Oncology

## 2021-10-28 ENCOUNTER — Encounter (HOSPITAL_COMMUNITY): Payer: Self-pay

## 2021-10-28 ENCOUNTER — Ambulatory Visit (HOSPITAL_COMMUNITY)
Admission: RE | Admit: 2021-10-28 | Discharge: 2021-10-28 | Disposition: A | Discharge status: Home / Self Care | Payer: Medicaid Other | Source: Ambulatory Visit | Attending: Hematology and Oncology | Admitting: Hematology and Oncology

## 2021-10-28 VITALS — BP 162/100 | HR 95 | Temp 97.8°F | Resp 20 | Ht 66.0 in | Wt 282.0 lb

## 2021-10-28 DIAGNOSIS — Z452 Encounter for adjustment and management of vascular access device: Secondary | ICD-10-CM | POA: Diagnosis present

## 2021-10-28 DIAGNOSIS — Z01818 Encounter for other preprocedural examination: Secondary | ICD-10-CM

## 2021-10-28 DIAGNOSIS — C539 Malignant neoplasm of cervix uteri, unspecified: Secondary | ICD-10-CM | POA: Insufficient documentation

## 2021-10-28 DIAGNOSIS — Z9221 Personal history of antineoplastic chemotherapy: Secondary | ICD-10-CM | POA: Insufficient documentation

## 2021-10-28 HISTORY — PX: IR REMOVAL TUN ACCESS W/ PORT W/O FL MOD SED: IMG2290

## 2021-10-28 LAB — GLUCOSE, CAPILLARY: Glucose-Capillary: 122 mg/dL — ABNORMAL HIGH (ref 70–99)

## 2021-10-28 MED ORDER — MIDAZOLAM HCL 2 MG/2ML IJ SOLN
INTRAMUSCULAR | Status: AC
  Filled 2021-10-28: qty 2

## 2021-10-28 MED ORDER — LIDOCAINE-EPINEPHRINE 1 %-1:100000 IJ SOLN
INTRAMUSCULAR | Status: AC | PRN
  Administered 2021-10-28: 20 mL

## 2021-10-28 MED ORDER — LIDOCAINE-EPINEPHRINE 1 %-1:100000 IJ SOLN
INTRAMUSCULAR | Status: AC
  Filled 2021-10-28: qty 1

## 2021-10-28 MED ORDER — FENTANYL CITRATE (PF) 100 MCG/2ML IJ SOLN
INTRAMUSCULAR | Status: AC
  Filled 2021-10-28: qty 2

## 2021-10-28 MED ORDER — FENTANYL CITRATE (PF) 100 MCG/2ML IJ SOLN
INTRAMUSCULAR | Status: AC | PRN
  Administered 2021-10-28 (×2): 50 ug via INTRAVENOUS

## 2021-10-28 MED ORDER — MIDAZOLAM HCL 2 MG/2ML IJ SOLN
INTRAMUSCULAR | Status: AC | PRN
  Administered 2021-10-28: 2 mg via INTRAVENOUS

## 2021-10-28 MED ORDER — SODIUM CHLORIDE 0.9 % IV SOLN: INTRAVENOUS | Status: DC

## 2021-10-28 NOTE — H&P (Signed)
Referring Physician(s): [name]  Supervising Physician: Ruthann Cancer  Patient Status:  [facility]  Chief Complaint:  "I'm getting my port out"  Subjective: Patient familiar to IR service from Port-A-Cath placement on 02/13/2021.  She has a history of metastatic cervical cancer, status post surgery and chemoradiation.  Latest imaging shows no residual disease.  She is no longer using her Port-A-Cath and presents today for Port-A-Cath removal.  She currently denies fever, headache, chest pain, dyspnea, cough, abdominal/back pain, nausea, vomiting or bleeding.  Past Medical History:  Diagnosis Date   Anxiety    Asthma    Depression    History of chemotherapy    02-22-2021  to 03-22-2021  for cervical cancer   History of COVID-19 02/2020   followed by pcp   (04-03-2021  per pt checks blood sugar once weekly)   History of external beam radiation therapy    02-25-2021  to 03-29-2021 for cervical cancer   History of radiation therapy    cervix - tandem and ring 04/09/2021  Dr Gery Pray   Iron deficiency anemia due to chronic blood loss    Malignant neoplasm cervix [facility])    oncologist--- dr gorsuch// radiation onologist-- dr Sondra Come;   dx 01-16-2021;   01-24-2021  s/p robotic RSO and left salpingectomy for right ovarian mass (endometrioid carcinoma);  completed chemo 03-22-2021 and IMRT 03-29-2021 (scheduled for high dose brachytherapy 04-09-2021)   Migraines    migraine   Morbid obesity ([place])    Type 2 diabetes mellitus ([place])    Past Surgical History:  Procedure Laterality Date   IR IMAGING GUIDED PORT INSERTION  02/13/2021   OPERATIVE ULTRASOUND N/A 04/09/2021   Procedure: OPERATIVE ULTRASOUND;  Surgeon: Gery Pray, MD;  Location: [place];  Service: Urology;  Laterality: N/A;   ROBOTIC ASSISTED TOTAL HYSTERECTOMY N/A 05/21/2021   Procedure: XI ROBOTIC ASSISTED TOTAL HYSTERECTOMY;  Surgeon: [name], MD;  Location: [facility];  Service: Gynecology;   Laterality: N/A;   TANDEM RING INSERTION N/A 04/09/2021   Procedure: TANDEM RING INSERTION;  Surgeon: Gery Pray, MD;  Location: [facility];  Service: Urology;  Laterality: N/A;   TONSILLECTOMY     child   XI ROBOTIC ASSISTED OOPHORECTOMY N/A 01/24/2021   Procedure: XI ROBOTIC ASSISTED RIGHT OOPHORECTOMY, OOPHOROPEXY OF LEFT OVARY;  Surgeon: [name], MD;  Location: [facility];  Service: Gynecology;  Laterality: N/A;   XI ROBOTIC ASSISTED OOPHORECTOMY Left 05/21/2021   Procedure: XI ROBOTIC ASSISTED LEFT OOPHORECTOMY;  Surgeon: [name], MD;  Location: [facility];  Service: Gynecology;  Laterality: Left;   XI ROBOTIC ASSISTED SALPINGECTOMY Bilateral 01/24/2021   Procedure: XI ROBOTIC ASSISTED SALPINGECTOMY; RIGHT URETERAL LYSIS; LYSIS OF ADHESIONS;  Surgeon: [name], MD;  Location: [facility];  Service: Gynecology;  Laterality: Bilateral;      Allergies: Pineapple and Prednisone  Medications: Prior to Admission medications   Medication Sig Start Date End Date Taking? Authorizing Provider  albuterol (VENTOLIN HFA) 108 (90 Base) MCG/ACT inhaler Inhale 1-2 puffs into the lungs every 6 (six) hours as needed for wheezing or shortness of breath.   Yes [provider]  estradiol (VIVELLE-DOT) 0.05 MG/24HR patch Place 1 patch (0.05 mg total) onto the skin 2 (two) times a week. 09/09/21  Yes [name], MD  ferrous sulfate 325 (65 FE) MG tablet Take 325 mg by mouth once a week.   Yes [provider]  ibuprofen (ADVIL) 200 MG tablet Take  200 mg by mouth 2 (two) times daily as needed for headache (pain).   Yes [provider]  metFORMIN (GLUCOPHAGE) 500 MG tablet Take 1 tablet (500 mg total) by mouth 2 (two) times daily with a meal. 10/03/20  Yes Chancy Milroy, MD  propranolol (INDERAL) 40 MG tablet Take 40 mg by mouth daily as needed (migraines).   Yes [provider]  prochlorperazine (COMPAZINE) 10 MG tablet Take 1  tablet (10 mg total) by mouth every 6 (six) hours as needed (Nausea or vomiting). Patient not taking: Reported on 02/18/2021 02/05/21 03/28/21  [name], MD     Vital Signs: BP (!) 146/90   Pulse 89   Temp 98 F (36.7 C) (Oral)   Resp 18   Ht '5\' 6"'$  (1.676 m)   Wt 282 lb (127.9 kg)   LMP 03/25/2021 (Approximate)   SpO2 100%   BMI 45.52 kg/m   Physical Exam awake, alert.  Chest clear to auscultation bilaterally.  Clean, intact right chest wall Port-A-Cath.  Heart with regular rate and rhythm.  Abdomen obese, soft, positive bowel sounds, nontender.  No lower extremity edema.  Imaging: No results found.  Labs:  CBC: Recent Labs    03/27/21 0936 05/02/21 1042 05/17/21 1157 07/22/21 1005  WBC 6.6 8.0 8.4 8.0  HGB 11.8* 12.8 14.0 14.3  HCT 37.8 38.3 41.9 43.4  PLT 210 226 259 256    COAGS: No results for input(s): "INR", "APTT" in the last 8760 hours.  BMP: Recent Labs    03/27/21 0936 05/02/21 1042 05/17/21 1157 07/22/21 1005 10/14/21 1247  NA 138 139 139 140  --   K 4.7 3.8 3.7 4.1  --   CL 101 104 107 104  --   CO2 32 '28 26 28  '$ --   GLUCOSE 111* 124* 119* 129*  --   BUN '14 14 11 11  '$ --   CALCIUM 9.6 9.4 9.4 10.1  --   CREATININE 0.60 0.63 0.63 0.69 0.70  GFRNONAA >60 >60 >60 >60  --     LIVER FUNCTION TESTS: Recent Labs    01/18/21 0855 05/02/21 1042 05/17/21 1157 07/22/21 1005  BILITOT 0.3 0.3 0.3 0.4  AST 12* 18 32 26  ALT 11 31 39 32  ALKPHOS 92 147* 141* 160*  PROT 8.1 7.3 7.7 7.9  ALBUMIN 4.3 4.2 3.9 4.4    Assessment and Plan: Patient familiar to IR service from Port-A-Cath placement on 02/13/2021.  She has a history of metastatic cervical cancer, status post surgery and chemoradiation.  Latest imaging shows no residual disease.  She is no longer using her Port-A-Cath and presents today for Port-A-Cath removal.  Details/risks of procedure, including but not limited to, internal bleeding, infection, injury to adjacent structures discussed  with patient with her understanding and consent.   Electronically Signed: D. Rowe Robert, PA-C 10/28/2021, 11:53 AM   I spent a total of 15 Minutes at the the patient's bedside AND on the patient's hospital floor or unit, greater than 50% of which was counseling/coordinating care for port a cath removal

## 2021-10-28 NOTE — Discharge Instructions (Signed)
Please call Interventional Radiology clinic [phone] with any questions or concerns.  You may remove your dressing and shower tomorrow.    Implanted Port Removal, Care After This sheet gives you information about how to care for yourself after your procedure. Your health care provider may also give you more specific instructions. If you have problems or questions, contact your health careprovider. What can I expect after the procedure? After the procedure, it is common to have: Soreness or pain near your incision. Some swelling or bruising near your incision. Follow these instructions at home: Medicines Take over-the-counter and prescription medicines only as told by your health care provider. If you were prescribed an antibiotic medicine, take it as told by your health care provider. Do not stop taking the antibiotic even if you start to feel better. Bathing Do not take baths, swim, or use a hot tub until your health care provider approves. Ask your health care provider if you can take showers. You may only be allowed to take sponge baths. Incision care Follow instructions from your health care provider about how to take care of your incision. Make sure you: Wash your hands with soap and water before you change your bandage (dressing). If soap and water are not available, use hand sanitizer. Change your dressing as told by your health care provider. Keep your dressing dry. Leave  skin glue, or adhesive strips in place. These skin closures may need to stay in place for 2 weeks or longer. If adhesive strip edges start to loosen and curl up, you may trim the loose edges.  Check your incision area every day for signs of infection. Check for:       - More redness, swelling, or pain.       - More fluid or blood.       - Warmth.       - Pus or a bad smell.  Driving Do not drive for 24 hours if you were given a medicine to help you relax (sedative) during your procedure. If you did not  receive a sedative, ask your health care provider when it is safe to drive.  Activity Return to your normal activities as told by your health care provider. Ask your health care provider what activities are safe for you. Do not lift anything that is heavier than 10 lb (4.5 kg), or the limit that you are told, until your health care provider says that it is safe. Do not do activities that involve lifting your arms over your head. General instructions Do not use any products that contain nicotine or tobacco, such as cigarettes and e-cigarettes. These can delay healing. If you need help quitting, ask your health care provider. Keep all follow-up visits as told by your health care provider. This is important. Contact a health care provider if: You have more redness, swelling, or pain around your incision. You have more fluid or blood coming from your incision. Your incision feels warm to the touch. You have pus or a bad smell coming from your incision. You have pain that is not relieved by your pain medicine. Get help right away if you have: A fever or chills. Chest pain. Difficulty breathing. Summary After the procedure, it is common to have pain, soreness, swelling, or bruising near your incision. If you were prescribed an antibiotic medicine, take it as told by your health care provider. Do not stop taking the antibiotic even if you start to feel better. Do not drive for 24  hours if you were given a sedative during your procedure. Return to your normal activities as told by your health care provider. Ask your health care provider what activities are safe for you. This information is not intended to replace advice given to you by your health care provider. Make sure you discuss any questions you have with your healthcare provider. Document Revised: 02/19/2017 Document Reviewed: 02/19/2017 Elsevier Patient Education  2022 [place].   Moderate Conscious Sedation, Adult, Care After This  sheet gives you information about how to care for yourself after your procedure. Your health care provider may also give you more specific instructions. If you have problems or questions, contact your health careprovider. What can I expect after the procedure? After the procedure, it is common to have: Sleepiness for several hours. Impaired judgment for several hours. Difficulty with balance. Vomiting if you eat too soon. Follow these instructions at home: For the time period you were told by your health care provider: Rest. Do not participate in activities where you could fall or become injured. Do not drive or use machinery. Do not drink alcohol. Do not take sleeping pills or medicines that cause drowsiness. Do not make important decisions or sign legal documents. Do not take care of children on your own. Eating and drinking  Follow the diet recommended by your health care provider. Drink enough fluid to keep your urine pale yellow. If you vomit: Drink water, juice, or soup when you can drink without vomiting. Make sure you have little or no nausea before eating solid foods.  General instructions Take over-the-counter and prescription medicines only as told by your health care provider. Have a responsible adult stay with you for the time you are told. It is important to have someone help care for you until you are awake and alert. Do not smoke. Keep all follow-up visits as told by your health care provider. This is important. Contact a health care provider if: You are still sleepy or having trouble with balance after 24 hours. You feel light-headed. You keep feeling nauseous or you keep vomiting. You develop a rash. You have a fever. You have redness or swelling around the IV site. Get help right away if: You have trouble breathing. You have new-onset confusion at home. Summary After the procedure, it is common to feel sleepy, have impaired judgment, or feel nauseous if you  eat too soon. Rest after you get home. Know the things you should not do after the procedure. Follow the diet recommended by your health care provider and drink enough fluid to keep your urine pale yellow. Get help right away if you have trouble breathing or new-onset confusion at home. This information is not intended to replace advice given to you by your health care provider. Make sure you discuss any questions you have with your healthcare provider. Document Revised: 05/06/2019 Document Reviewed: 12/02/2018 Elsevier Patient Education  2022 [employer].

## 2021-10-28 NOTE — Procedures (Signed)
Interventional Radiology Procedure Note  Procedure: Port Removal  Findings: Port removed successfully.  Complications: None  EBL: < 10 mL  Recommendations:  - Ok to shower in 24 hours - Do not submerge for 7 days   Ruthann Cancer, MD Pager: [phone]

## 2021-11-08 ENCOUNTER — Inpatient Hospital Stay: Payer: Medicaid Other

## 2021-11-08 ENCOUNTER — Inpatient Hospital Stay: Payer: Medicaid Other | Admitting: Hematology and Oncology

## 2021-12-10 ENCOUNTER — Encounter: Payer: Self-pay | Admitting: Gynecologic Oncology

## 2021-12-10 ENCOUNTER — Inpatient Hospital Stay: Payer: Medicaid Other | Attending: Gynecologic Oncology | Admitting: Gynecologic Oncology

## 2021-12-10 VITALS — BP 140/72 | HR 96 | Temp 98.5°F | Resp 18 | Ht 66.93 in | Wt 298.0 lb

## 2021-12-10 DIAGNOSIS — Z9221 Personal history of antineoplastic chemotherapy: Secondary | ICD-10-CM | POA: Insufficient documentation

## 2021-12-10 DIAGNOSIS — Z90721 Acquired absence of ovaries, unilateral: Secondary | ICD-10-CM | POA: Diagnosis not present

## 2021-12-10 DIAGNOSIS — Z923 Personal history of irradiation: Secondary | ICD-10-CM | POA: Insufficient documentation

## 2021-12-10 DIAGNOSIS — Z8541 Personal history of malignant neoplasm of cervix uteri: Secondary | ICD-10-CM | POA: Diagnosis not present

## 2021-12-10 DIAGNOSIS — C539 Malignant neoplasm of cervix uteri, unspecified: Secondary | ICD-10-CM

## 2021-12-10 DIAGNOSIS — N951 Menopausal and female climacteric states: Secondary | ICD-10-CM | POA: Diagnosis not present

## 2021-12-10 MED ORDER — ESTRADIOL 0.1 MG/24HR TD PTTW: 1.0000 | MEDICATED_PATCH | TRANSDERMAL | 12 refills | Status: DC

## 2021-12-10 NOTE — Progress Notes (Signed)
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 Large indeterminate right adnexal mass with solid hypervascular and cystic components. This appears separate from the base of the appendix on preceding CT and is likely arising from the right ovary based on relationship to the gonadal vessels on CT. Given the patient's young age, hypervascularity and leukocytosis, findings could represent an atypical tubo-ovarian abscess. However, the appearance is concerning for ovarian malignancy despite the patient's young age. Recommend prompt gynecology consultation. Pelvic MRI without and with contrast may be helpful for further evaluation. 2. The left ovary appears normal. Mild nonspecific thickening of the endometrium, within physiologic limits.   01/02/2021 Pathology Results   SPECIMEN ADEQUACY: Satisfactory for evaluation; transformation zone component PRESENT. INTERPRETATION: - Adenocarcinoma, NOS   01/15/2021 Imaging   MRI pelvis  13.0 cm cystic and solid mass arising from the right ovary, highly suspicious for ovarian carcinoma.   Mild endometrial thickening and 4.5 cm soft tissue mass within the endocervical canal. Differential diagnosis includes prolapsing endometrial polyp or carcinoma, or primary cervical carcinoma.   No evidence of pelvic metastatic disease.   01/16/2021 Initial Diagnosis   Malignant neoplasm of cervix ([place])   01/22/2021 PET scan   1. Large cystic/necrotic right adnexal mass is markedly hypermetabolic and consistent with known neoplasm. 2. Extensive hypermetabolic tumor involving the endometrium and cervix. 3. Scattered borderline enlarged and  mildly hypermetabolic retroperitoneal and external iliac nodes. 4. No findings for metastatic disease involving the chest or bony structures.   01/24/2021 Surgery   Pre-operative Diagnosis: Complex adnexal mass, elevated CA-125, locally invasive cervical adenocarcinoma versus endometrial adenocarcinoma   Post-operative Diagnosis: same, high-grade carcinoma of the right ovary, suspected history of superinfection of the adnexal mass   Operation: Robotic-assisted laparoscopic bilateral salpingectomy, right oophorectomy, lysis of adhesions of approximately 45 minutes, right ureterolysis   Surgeon: Jeral Pinch MD    Operative Findings: On EUA, cervix enlarged and firm, fleshy tumor replacing the endocervix.  No definitive parametrial involvement appreciated.  Even gentle exam because significant bleeding so vagina was packed with lap sponges for the remainder of surgery.  On intra-abdominal exam, normal upper abdominal survey including omentum, liver edge, diaphragm, and stomach.  Normal small bowel.  Some physiologic adhesions of the sigmoid epiploica to the left sidewall.  Normal-appearing left ovary measuring approximately 3-4 cm.  Normal-appearing bilateral fallopian tubes.  Uterus approximately 8-10 cm and overall normal in appearance.  Right ovary replaced by a smooth but vascular 10 cm mass with multiple loculations.  Ovary with dense adhesions to the right pelvic sidewall.  Upon very careful manipulation of the mass for its removal, there was some drainage of fluid noted that was somewhat purulent.  This was sent for culture and Gram stain.  Upon contained mass removal with fractionation of the mass, tumor versus necrotic material was noted.  Frozen section consistent with high-grade carcinoma, unable to differentiate on frozen section whether this is the same cancer being seen on her cervical biopsies.  Right ureterolysis performed given oozing services from the broad ligament and peritoneum  where adhesions were lysed between the ovary and peritoneum, to assure ureter not in close proximity.  No obvious adenopathy.  No ascites.  Left ovary transposed out of the pelvis along the left gutter, clips placed along the sidewall as well as the superior and inferior aspect of the ovary.     01/24/2021 Pathology Results   FINAL MICROSCOPIC DIAGNOSIS:   A. OVARY AND FALLOPIAN TUBE, RIGHT, SALPINGO OOPHORECTOMY:  - Endometrioid carcinoma, moderately differentiated, involving right ovary  - No evidence of ovarian surface involvement by carcinoma  - Segment of fallopian tube, negative for carcinoma  - See oncology table  - See comment   B. FALLOPIAN TUBE, LEFT, SALPINGECTOMY:  - Segment of fallopian tube, negative for carcinoma   A.  Immunostain for p16 shows patchy staining.  Immunostain for p53 shows a normal, wild-type expression pattern.  This immunoprofile is consistent with above interpretation.  Patient's previous cervical  biopsy was reviewed - it shows moderately differentiated carcinoma with a very similar histomorphology and very likely presents metastasis from the ovarian tumor to the cervix.    02/05/2021 Cancer Staging   Staging form: Cervix Uteri, AJCC Version 9 - Pathologic stage from 02/05/2021: FIGO Stage II (pT2, pN0, cM0) - Signed by [name], MD on 02/05/2021 Stage prefix: Initial diagnosis   02/14/2021 Procedure   Successful placement of a right internal jugular approach power injectable Port-A-Cath. The catheter is ready for immediate use   02/22/2021 - 03/22/2021 Chemotherapy   Patient is on Treatment Plan : Cervical cancer Cisplatin q7d     02/25/2021 - 03/29/2021 Radiation Therapy   Radiation Treatment Dates: 02/25/2021 through 04/09/2021 Pelvic IMRT: 02/25/21 through 03/29/21 HDR-brachy: 04/09/21 Site Technique Total Dose (Gy) Dose per Fx (Gy) Completed Fx Beam Energies  Pelvis: Pelvis IMRT 45/45 1.8 25/25 6X  Cervix: Cervix_Bst_Fx1 HDR-brachy 5.5/5.5 5.5 1/1 Ir-192          05/13/2021 Imaging   1. Status post bilateral oophorectomy. Previously seen large hypermetabolic right adnexal mass has been resected. Nodular soft tissue in the vicinity of both the left and right oophorectomy bed, likely postoperative in nature. Attention on follow-up. 2. Previously noted uterine/cervical mass is resolved, consistent with treatment response. 3. Prominent, previously FDG avid iliac and retroperitoneal lymph nodes are slightly diminished in size, consistent with treatment response. 4. Hypodense liver lesion of hepatic segment IVa measuring 1.5 x 1.2 cm, not previously appreciated by contrast enhanced CT or PET-CT, incompletely characterized although worrisome for hepatic metastasis. This could be further characterized by multiphasic contrast enhanced MRI. 5. Hepatomegaly and hepatic steatosis. 6. Splenomegaly.     05/21/2021 Surgery   Robotic-assisted modified radical hysterectomy with left oophorectomy, cystoscopy  Findings:  On EUA, some radiation changes noted with deviation of the cervix posteriorly. On intra-abdominal entry, normal upper abdominal survey. Normal omentum, small and large bowel. Surgically absent right adnexa. Left ovary pexed along sidewall above the pelvic brim (normal in appearance). Uterus 8-10 cm and somewhat bulbous. Significant retroperitoneal edema, some fibrosis. 2-3 cm tumor implant along the right parametrium and distal uterosacral ligament, free from the cervix itself. No obvious paracervical tumor extension. No gross adenopathy. No ascites.  On cystoscopy, bladder intact, good efflux noted from bilateral ureteral orifices.   05/21/2021 Pathology Results   A. UTERUS AND CERVIX, HYSTERECTOMY:  - Microscopic fragment in the upper endocervical canal suspicious for  treated adenocarcinoma associated with extensive treatment response.  - Fibrosis and cytologic atypia in the endometrium consistent with  treatment effects.  - See comment.   B.  PERIMETRIUM, RIGHT, EXCISION  - Amorphous material associated with histiocytic response.  - Connective tissue  with inflammation, fibrosis and dystrophic  calcifications.  - No malignancy identified.   C. OVARY, LEFT, OOPHORECTOMY:  - Benign ovary.  - No malignancy identified.   COMMENT:  There is a 1.7 cm polypoid mass involving the anterior endocervical canal which histologically shows dense fibrosis with scattered glands with cytologic atypia consistent with therapy effects.  This area has a microscopic, partially detached fragment with marked atypia and architectural changes suspicious for microscopic residual adenocarcinoma.  Elsewhere, the endometrium shows foci of similar fibrosis with patchy cytologic atypia consistent with therapy effects.  There are foci where fibrosis extends into the adjacent upper portions of the myometrium.  There is insufficient residual suspicious tissue present to resolve the location of the primary in this case.  The pattern of fibrotic apparent therapy affects is more suggestive of an ovarian metastasis from an endocervical or endometrial primary.    07/16/2021 PET scan   1. Interval hysterectomy with mild residual uptake in the surgical bed. This examination will serve as baseline for future comparison. No evidence of distant metastatic disease. 2. Relatively patchy/mottled uptake throughout the spine may be treatment related. No definite focal lesion. 3. Severely enlarged steatotic liver. 4. Splenomegaly.   10/28/2021 Procedure   Successful removal of Port-A-Cath without immediate post procedural complication.     Interval History: Doing well.  Denies abdominal or pelvic pain.  Denies any vaginal bleeding or discharge.  Reports regular bowel and bladder function.  Is having hot flashes, especially bothersome at night.  Has been using the patch.  Also endorses worsening of her migraines since the surgery.  She will now have a migraine for approximately 2  days.  Denies any aura associated with her migraines.  Past Medical/Surgical History: Past Medical History:  Diagnosis Date   Anxiety    Asthma    Depression    History of chemotherapy    02-22-2021  to 03-22-2021  for cervical cancer   History of COVID-19 02/2020   followed by pcp   (04-03-2021  per pt checks blood sugar once weekly)   History of external beam radiation therapy    02-25-2021  to 03-29-2021 for cervical cancer   History of radiation therapy    cervix - tandem and ring 04/09/2021  Dr Gery Pray   Iron deficiency anemia due to chronic blood loss    Malignant neoplasm cervix [facility])    oncologist--- dr gorsuch// radiation onologist-- dr Sondra Come;   dx 01-16-2021;   01-24-2021  s/p robotic RSO and left salpingectomy for right ovarian mass (endometrioid carcinoma);  completed chemo 03-22-2021 and IMRT 03-29-2021 (scheduled for high dose brachytherapy 04-09-2021)   Migraines    migraine   Morbid obesity ([place])    Type 2 diabetes mellitus ([place])     Past Surgical History:  Procedure Laterality Date   IR IMAGING GUIDED PORT INSERTION  02/13/2021   IR REMOVAL TUN ACCESS W/ PORT W/O FL MOD SED  10/28/2021   OPERATIVE ULTRASOUND N/A 04/09/2021   Procedure: OPERATIVE ULTRASOUND;  Surgeon: Gery Pray, MD;  Location: [facility];  Service: Urology;  Laterality: N/A;   ROBOTIC ASSISTED TOTAL HYSTERECTOMY N/A 05/21/2021   Procedure: XI ROBOTIC ASSISTED TOTAL HYSTERECTOMY;  Surgeon: [name], MD;  Location: [facility];  Service: Gynecology;  Laterality: N/A;   TANDEM RING INSERTION N/A 04/09/2021   Procedure: TANDEM RING INSERTION;  Surgeon: Gery Pray, MD;  Location[facility];  Service: Urology;  Laterality: N/A;   TONSILLECTOMY  child   XI ROBOTIC ASSISTED OOPHORECTOMY N/A 01/24/2021   Procedure: XI ROBOTIC ASSISTED RIGHT OOPHORECTOMY, OOPHOROPEXY OF LEFT OVARY;  Surgeon: [name], MD;  Location: [facility];  Service: Gynecology;   Laterality: N/A;   XI ROBOTIC ASSISTED OOPHORECTOMY Left 05/21/2021   Procedure: XI ROBOTIC ASSISTED LEFT OOPHORECTOMY;  Surgeon: [name], MD;  Location: [facility];  Service: Gynecology;  Laterality: Left;   XI ROBOTIC ASSISTED SALPINGECTOMY Bilateral 01/24/2021   Procedure: XI ROBOTIC ASSISTED SALPINGECTOMY; RIGHT URETERAL LYSIS; LYSIS OF ADHESIONS;  Surgeon: [name], MD;  Location: [facility];  Service: Gynecology;  Laterality: Bilateral;    Family History  Problem Relation Age of Onset   Asthma Mother    Diabetes Mother    Hypertension Mother    Breast cancer Mother    Asthma Father    Diabetes Father    Kidney disease Father    Diabetes Sister    Asthma Sister    Asthma Brother    Diabetes Brother    Cancer Maternal Aunt    Diabetes Maternal Uncle    Diabetes Maternal Grandmother    Hypertension Maternal Grandmother    Anemia Maternal Grandmother    Heart attack Paternal Grandfather    Cancer Paternal Grandfather    Colon cancer Neg Hx    Ovarian cancer Neg Hx    Endometrial cancer Neg Hx    Pancreatic cancer Neg Hx    Prostate cancer Neg Hx     Social History   Socioeconomic History   Marital status: Single    Spouse name: Not on file   Number of children: Not on file   Years of education: Not on file   Highest education level: Not on file  Occupational History   Not on file  Tobacco Use   Smoking status: Never   Smokeless tobacco: Never  Vaping Use   Vaping Use: Never used  Substance and Sexual Activity   Alcohol use: Never   Drug use: Never   Sexual activity: Not Currently    Birth control/protection: None    Comment: female partner  Other Topics Concern   Not on file  Social History Narrative   Not on file   Social Determinants of Health   Financial Resource Strain: Not on file  Food Insecurity: Not on file  Transportation Needs: Not on file  Physical Activity: Not on file  Stress: Not on file  Social Connections: Not on file     Current Medications:  Current Outpatient Medications:    albuterol (VENTOLIN HFA) 108 (90 Base) MCG/ACT inhaler, Inhale 1-2 puffs into the lungs every 6 (six) hours as needed for wheezing or shortness of breath., Disp: , Rfl:    amLODipine (NORVASC) 5 MG tablet,  , Disp: , Rfl:    Ascorbic Acid (VITAMIN C) 500 MG CHEW, 1 capsule in the evening with iron tablets Orally Once a day for 90 days, Disp: , Rfl:    [START ON 12/12/2021] estradiol (VIVELLE-DOT) 0.1 MG/24HR patch, Place 1 patch (0.1 mg total) onto the skin 2 (two) times a week., Disp: 8 patch, Rfl: 12   ferrous sulfate 325 (65 FE) MG tablet, Take 325 mg by mouth once a week., Disp: , Rfl:    ibuprofen (ADVIL) 200 MG tablet, Take 200 mg by mouth 2 (two) times daily as needed for headache (pain)., Disp: , Rfl:    metFORMIN (GLUCOPHAGE) 500 MG tablet, Take 1 tablet (500 mg total) by mouth 2 (two) times  daily with a meal., Disp: 60 tablet, Rfl: 1   OZEMPIC, 0.25 OR 0.5 MG/DOSE, 2 MG/3ML SOPN, Inject 0.38m weekly Subcutaneous weekly for 60 days, Disp: , Rfl:    propranolol (INDERAL) 40 MG tablet, Take 40 mg by mouth daily as needed (migraines)., Disp: , Rfl:   Review of Systems: + Hot flashes, migraine Denies appetite changes, fevers, chills, fatigue, unexplained weight changes. Denies hearing loss, neck lumps or masses, mouth sores, ringing in ears or voice changes. Denies cough or wheezing.  Denies shortness of breath. Denies chest pain or palpitations. Denies leg swelling. Denies abdominal distention, pain, blood in stools, constipation, diarrhea, nausea, vomiting, or early satiety. Denies pain with intercourse, dysuria, frequency, hematuria or incontinence. Denies hot flashes, pelvic pain, vaginal bleeding or vaginal discharge.   Denies joint pain, back pain or muscle pain/cramps. Denies itching, rash, or wounds. Denies dizziness, headaches, numbness or seizures. Denies swollen lymph nodes or glands, denies easy bruising or  bleeding. Denies anxiety, depression, confusion, or decreased concentration.  Physical Exam: BP (!) 140/72 Comment: manual BP recheck  Pulse 96   Temp 98.5 F (36.9 C) (Oral)   Resp 18   Ht 5' 6.93" (1.7 m)   Wt 298 lb (135.2 kg)   LMP 03/25/2021 (Approximate)   SpO2 99%   BMI 46.77 kg/m  General: Alert, oriented, no acute distress. HEENT: Normocephalic, atraumatic, sclera anicteric. Chest: Clear to auscultation bilaterally.  No wheezes or rhonchi. Cardiovascular: Regular rate and rhythm, no murmurs. Abdomen: Obese, soft, nontender.  Normoactive bowel sounds.  No masses or hepatosplenomegaly appreciated.  Well-healed incisions. Extremities: Grossly normal range of motion.  Warm, well perfused.  No edema bilaterally. Skin: No rashes or lesions noted. GU: Normal appearing external genitalia without erythema, excoriation, or lesions.  Speculum exam reveals somewhat atrophic vaginal mucosa with radiation changes present.  Cuff intact, no lesions visible.  Bimanual exam reveals cuff intact, no masses or nodularity.  This is confirmed on rectovaginal exam.   Laboratory & Radiologic Studies: CT C/A/P on 9/25: 1. Surgical changes of prior hysterectomy with similar fat stranding in the pelvis and mild nodular thickening of the anterior peritoneal reflection, favored to represent postsurgical/treatment change. Attention on follow-up imaging suggested. 2. No evidence of metastatic disease in the chest, abdomen or pelvis. 3. Similar hepatosplenomegaly with diffuse hepatic steatosis. 4. Scattered left-sided colonic diverticulosis without findings of acute diverticulitis.  Assessment & Plan: Alyssa Becker a 26y.o. 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  PDL1 10%.   Patient is overall doing well and is  NED on exam.   We reviewed most recent imaging from September.  Overall, no evidence of recurrent cancer.  I am recommending that we repeat imaging in February to assure that changes in the pelvis are in fact postsurgical.    Patient continues to have some bothersome hot flashes.  We had started her on a mid range estrogen dose given her estrogen receptor positive tumor.  Because of her young age and symptoms, I am recommending that we increase her estrogen dose.  She has a history of migraines, denies any aura.  She has noticed worsening of migraines since surgery which may have some relationship to estrogen levels.  I have asked her to let me know if she does not see improvement in both her hot flashes and migraines over the next month.  Per NCCN surveillance recommendations, we will continue with visits every 3 months.  These will include a pelvic exam.  Pap test and HPV testing will performed yearly (due in April 2024).    22 minutes of total time was spent for this patient encounter, including preparation, face-to-face counseling with the patient and coordination of care, and documentation of the encounter.  Jeral Pinch, MD  Division of Gynecologic Oncology  Department of Obstetrics and Gynecology  [facility]

## 2021-12-10 NOTE — Patient Instructions (Signed)
It was good to see you today.  I do not see or feel any evidence of cancer recurrence on your exam.  We will plan to repeat a CT scan in February.  I will see you back around that same time.  I am sending a new prescription for the patch to your pharmacy.  This is a higher dose that we will hopefully decrease the hot flashes that you are having.  Please let me know in the next month if you have not noticed significant change.  As always, if you develop any new and concerning symptoms before your next visit, please call to see me sooner.

## 2021-12-18 ENCOUNTER — Telehealth: Payer: Self-pay

## 2021-12-18 NOTE — Telephone Encounter (Signed)
[facility] Department called stating they received notes from Dr. Berline Lopes. [name] is not a patient there.   I called Alyssa Becker to verify her PCP. She states she goes to [facility], [facility].   Dr. Lulu Riding last note faxed (fax# [phone]) to PCP which has been changed in pt's chart.

## 2022-03-11 ENCOUNTER — Ambulatory Visit (HOSPITAL_COMMUNITY)
Admission: RE | Admit: 2022-03-11 | Discharge: 2022-03-11 | Disposition: A | Discharge status: Home / Self Care | Payer: Medicaid Other | Source: Ambulatory Visit | Attending: Gynecologic Oncology | Admitting: Gynecologic Oncology

## 2022-03-11 ENCOUNTER — Encounter (HOSPITAL_COMMUNITY): Payer: Self-pay

## 2022-03-11 DIAGNOSIS — C539 Malignant neoplasm of cervix uteri, unspecified: Secondary | ICD-10-CM | POA: Insufficient documentation

## 2022-03-11 LAB — POCT I-STAT CREATININE: Creatinine, Ser: 0.6 mg/dL (ref 0.44–1.00)

## 2022-03-11 MED ORDER — IOHEXOL 9 MG/ML PO SOLN
ORAL | Status: AC
  Filled 2022-03-11: qty 1000

## 2022-03-11 MED ORDER — IOHEXOL 300 MG/ML  SOLN
100.0000 mL | Freq: Once | INTRAMUSCULAR | Status: AC | PRN
  Administered 2022-03-11: 100 mL via INTRAVENOUS

## 2022-03-11 MED ORDER — SODIUM CHLORIDE (PF) 0.9 % IJ SOLN
INTRAMUSCULAR | Status: AC
  Filled 2022-03-11: qty 50

## 2022-03-11 MED ORDER — IOHEXOL 9 MG/ML PO SOLN
1000.0000 mL | ORAL | Status: AC
  Administered 2022-03-11: 1000 mL via ORAL

## 2022-03-13 ENCOUNTER — Encounter: Payer: Self-pay | Admitting: Gynecologic Oncology

## 2022-03-14 ENCOUNTER — Encounter: Payer: Self-pay | Admitting: Gynecologic Oncology

## 2022-03-14 ENCOUNTER — Inpatient Hospital Stay: Payer: Medicaid Other | Attending: Gynecologic Oncology | Admitting: Gynecologic Oncology

## 2022-03-14 ENCOUNTER — Other Ambulatory Visit: Payer: Self-pay

## 2022-03-14 VITALS — BP 132/87 | HR 90 | Temp 98.4°F | Resp 16 | Wt 303.6 lb

## 2022-03-14 DIAGNOSIS — N951 Menopausal and female climacteric states: Secondary | ICD-10-CM | POA: Diagnosis not present

## 2022-03-14 DIAGNOSIS — Z923 Personal history of irradiation: Secondary | ICD-10-CM | POA: Insufficient documentation

## 2022-03-14 DIAGNOSIS — Z90710 Acquired absence of both cervix and uterus: Secondary | ICD-10-CM | POA: Insufficient documentation

## 2022-03-14 DIAGNOSIS — F32A Depression, unspecified: Secondary | ICD-10-CM | POA: Insufficient documentation

## 2022-03-14 DIAGNOSIS — Z90722 Acquired absence of ovaries, bilateral: Secondary | ICD-10-CM | POA: Diagnosis not present

## 2022-03-14 DIAGNOSIS — C539 Malignant neoplasm of cervix uteri, unspecified: Secondary | ICD-10-CM

## 2022-03-14 DIAGNOSIS — Z9221 Personal history of antineoplastic chemotherapy: Secondary | ICD-10-CM | POA: Diagnosis not present

## 2022-03-14 DIAGNOSIS — C579 Malignant neoplasm of female genital organ, unspecified: Secondary | ICD-10-CM

## 2022-03-14 DIAGNOSIS — Z8541 Personal history of malignant neoplasm of cervix uteri: Secondary | ICD-10-CM | POA: Diagnosis present

## 2022-03-14 DIAGNOSIS — Z6841 Body Mass Index (BMI) 40.0 and over, adult: Secondary | ICD-10-CM | POA: Diagnosis not present

## 2022-03-14 MED ORDER — ESTRADIOL 1 MG PO TABS: 1.0000 mg | ORAL_TABLET | Freq: Every day | ORAL | 6 refills | Status: DC

## 2022-03-14 MED ORDER — SERTRALINE HCL 50 MG PO TABS: 50.0000 mg | ORAL_TABLET | Freq: Every day | ORAL | 6 refills | Status: DC

## 2022-03-14 NOTE — Progress Notes (Signed)
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 Large indeterminate right adnexal mass with solid hypervascular and cystic components. This appears separate from the base of the appendix on preceding CT and is likely arising from the right ovary based on relationship to the gonadal vessels on CT. Given the patient's young age, hypervascularity and leukocytosis, findings could represent an atypical tubo-ovarian abscess. However, the appearance is concerning for ovarian malignancy despite the patient's young age. Recommend prompt gynecology consultation. Pelvic MRI without and with contrast may be helpful for further evaluation. 2. The left ovary appears normal. Mild nonspecific thickening of the endometrium, within physiologic limits.   01/02/2021 Pathology Results   SPECIMEN ADEQUACY: Satisfactory for evaluation; transformation zone component PRESENT. INTERPRETATION: - Adenocarcinoma, NOS   01/15/2021 Imaging   MRI pelvis  13.0 cm cystic and solid mass arising from the right ovary, highly suspicious for ovarian carcinoma.   Mild endometrial thickening and 4.5 cm soft tissue mass within the endocervical canal. Differential diagnosis includes prolapsing endometrial polyp or carcinoma, or primary cervical carcinoma.   No evidence of pelvic metastatic disease.   01/16/2021 Initial Diagnosis   Malignant neoplasm of cervix ([place])   01/22/2021 PET scan   1. Large cystic/necrotic right adnexal mass is markedly hypermetabolic and consistent with known neoplasm. 2. Extensive hypermetabolic tumor involving the endometrium and cervix. 3. Scattered borderline enlarged and  mildly hypermetabolic retroperitoneal and external iliac nodes. 4. No findings for metastatic disease involving the chest or bony structures.   01/24/2021 Surgery   Pre-operative Diagnosis: Complex adnexal mass, elevated CA-125, locally invasive cervical adenocarcinoma versus endometrial adenocarcinoma   Post-operative Diagnosis: same, high-grade carcinoma of the right ovary, suspected history of superinfection of the adnexal mass   Operation: Robotic-assisted laparoscopic bilateral salpingectomy, right oophorectomy, lysis of adhesions of approximately 45 minutes, right ureterolysis   Surgeon: Jeral Pinch MD    Operative Findings: On EUA, cervix enlarged and firm, fleshy tumor replacing the endocervix.  No definitive parametrial involvement appreciated.  Even gentle exam because significant bleeding so vagina was packed with lap sponges for the remainder of surgery.  On intra-abdominal exam, normal upper abdominal survey including omentum, liver edge, diaphragm, and stomach.  Normal small bowel.  Some physiologic adhesions of the sigmoid epiploica to the left sidewall.  Normal-appearing left ovary measuring approximately 3-4 cm.  Normal-appearing bilateral fallopian tubes.  Uterus approximately 8-10 cm and overall normal in appearance.  Right ovary replaced by a smooth but vascular 10 cm mass with multiple loculations.  Ovary with dense adhesions to the right pelvic sidewall.  Upon very careful manipulation of the mass for its removal, there was some drainage of fluid noted that was somewhat purulent.  This was sent for culture and Gram stain.  Upon contained mass removal with fractionation of the mass, tumor versus necrotic material was noted.  Frozen section consistent with high-grade carcinoma, unable to differentiate on frozen section whether this is the same cancer being seen on her cervical biopsies.  Right ureterolysis performed given oozing services from the broad ligament and peritoneum  where adhesions were lysed between the ovary and peritoneum, to assure ureter not in close proximity.  No obvious adenopathy.  No ascites.  Left ovary transposed out of the pelvis along the left gutter, clips placed along the sidewall as well as the superior and inferior aspect of the ovary.     01/24/2021 Pathology Results   FINAL MICROSCOPIC DIAGNOSIS:   A. OVARY AND FALLOPIAN TUBE, RIGHT, SALPINGO OOPHORECTOMY:  - Endometrioid carcinoma, moderately differentiated, involving right ovary  - No evidence of ovarian surface involvement by carcinoma  - Segment of fallopian tube, negative for carcinoma  - See oncology table  - See comment   B. FALLOPIAN TUBE, LEFT, SALPINGECTOMY:  - Segment of fallopian tube, negative for carcinoma   A.  Immunostain for p16 shows patchy staining.  Immunostain for p53 shows a normal, wild-type expression pattern.  This immunoprofile is consistent with above interpretation.  Patient's previous cervical  biopsy was reviewed - it shows moderately differentiated carcinoma with a very similar histomorphology and very likely presents metastasis from the ovarian tumor to the cervix.    02/05/2021 Cancer Staging   Staging form: Cervix Uteri, AJCC Version 9 - Pathologic stage from 02/05/2021: FIGO Stage II (pT2, pN0, cM0) - Signed by [name], MD on 02/05/2021 Stage prefix: Initial diagnosis   02/14/2021 Procedure   Successful placement of a right internal jugular approach power injectable Port-A-Cath. The catheter is ready for immediate use   02/22/2021 - 03/22/2021 Chemotherapy   Patient is on Treatment Plan : Cervical cancer Cisplatin q7d     02/25/2021 - 03/29/2021 Radiation Therapy   Radiation Treatment Dates: 02/25/2021 through 04/09/2021 Pelvic IMRT: 02/25/21 through 03/29/21 HDR-brachy: 04/09/21 Site Technique Total Dose (Gy) Dose per Fx (Gy) Completed Fx Beam Energies  Pelvis: Pelvis IMRT 45/45 1.8 25/25 6X  Cervix: Cervix_Bst_Fx1 HDR-brachy 5.5/5.5 5.5 1/1 Ir-192          05/13/2021 Imaging   1. Status post bilateral oophorectomy. Previously seen large hypermetabolic right adnexal mass has been resected. Nodular soft tissue in the vicinity of both the left and right oophorectomy bed, likely postoperative in nature. Attention on follow-up. 2. Previously noted uterine/cervical mass is resolved, consistent with treatment response. 3. Prominent, previously FDG avid iliac and retroperitoneal lymph nodes are slightly diminished in size, consistent with treatment response. 4. Hypodense liver lesion of hepatic segment IVa measuring 1.5 x 1.2 cm, not previously appreciated by contrast enhanced CT or PET-CT, incompletely characterized although worrisome for hepatic metastasis. This could be further characterized by multiphasic contrast enhanced MRI. 5. Hepatomegaly and hepatic steatosis. 6. Splenomegaly.     05/21/2021 Surgery   Robotic-assisted modified radical hysterectomy with left oophorectomy, cystoscopy  Findings:  On EUA, some radiation changes noted with deviation of the cervix posteriorly. On intra-abdominal entry, normal upper abdominal survey. Normal omentum, small and large bowel. Surgically absent right adnexa. Left ovary pexed along sidewall above the pelvic brim (normal in appearance). Uterus 8-10 cm and somewhat bulbous. Significant retroperitoneal edema, some fibrosis. 2-3 cm tumor implant along the right parametrium and distal uterosacral ligament, free from the cervix itself. No obvious paracervical tumor extension. No gross adenopathy. No ascites.  On cystoscopy, bladder intact, good efflux noted from bilateral ureteral orifices.   05/21/2021 Pathology Results   A. UTERUS AND CERVIX, HYSTERECTOMY:  - Microscopic fragment in the upper endocervical canal suspicious for  treated adenocarcinoma associated with extensive treatment response.  - Fibrosis and cytologic atypia in the endometrium consistent with  treatment effects.  - See comment.   B.  PERIMETRIUM, RIGHT, EXCISION  - Amorphous material associated with histiocytic response.  - Connective tissue  with inflammation, fibrosis and dystrophic  calcifications.  - No malignancy identified.   C. OVARY, LEFT, OOPHORECTOMY:  - Benign ovary.  - No malignancy identified.   COMMENT:  There is a 1.7 cm polypoid mass involving the anterior endocervical canal which histologically shows dense fibrosis with scattered glands with cytologic atypia consistent with therapy effects.  This area has a microscopic, partially detached fragment with marked atypia and architectural changes suspicious for microscopic residual adenocarcinoma.  Elsewhere, the endometrium shows foci of similar fibrosis with patchy cytologic atypia consistent with therapy effects.  There are foci where fibrosis extends into the adjacent upper portions of the myometrium.  There is insufficient residual suspicious tissue present to resolve the location of the primary in this case.  The pattern of fibrotic apparent therapy affects is more suggestive of an ovarian metastasis from an endocervical or endometrial primary.    07/16/2021 PET scan   1. Interval hysterectomy with mild residual uptake in the surgical bed. This examination will serve as baseline for future comparison. No evidence of distant metastatic disease. 2. Relatively patchy/mottled uptake throughout the spine may be treatment related. No definite focal lesion. 3. Severely enlarged steatotic liver. 4. Splenomegaly.   10/28/2021 Procedure   Successful removal of Port-A-Cath without immediate post procedural complication.     Interval History: Patient reports overall doing well.  She stopped her hormone replacement therapy as it was making her feel tired.  She denies any hot flashes but has noted some mood changes including depression and anxiety.  She denies any vaginal bleeding or discharge.  She denies any pelvic or abdominal pain.  She reports baseline bowel  bladder function.  Past Medical/Surgical History: Past Medical History:  Diagnosis Date   Anxiety    Asthma    Depression    History of chemotherapy    02-22-2021  to 03-22-2021  for cervical cancer   History of COVID-19 02/2020   followed by pcp   (04-03-2021  per pt checks blood sugar once weekly)   History of external beam radiation therapy    02-25-2021  to 03-29-2021 for cervical cancer   History of radiation therapy    cervix - tandem and ring 04/09/2021  Dr Gery Pray   Iron deficiency anemia due to chronic blood loss    Malignant neoplasm cervix [facility])    oncologist--- dr gorsuch// radiation onologist-- dr Sondra Come;   dx 01-16-2021;   01-24-2021  s/p robotic RSO and left salpingectomy for right ovarian mass (endometrioid carcinoma);  completed chemo 03-22-2021 and IMRT 03-29-2021 (scheduled for high dose brachytherapy 04-09-2021)   Migraines    migraine   Morbid obesity ([place])    Type 2 diabetes mellitus ([place])     Past Surgical History:  Procedure Laterality Date   IR IMAGING GUIDED PORT INSERTION  02/13/2021   IR REMOVAL TUN ACCESS W/ PORT W/O FL MOD SED  10/28/2021   OPERATIVE ULTRASOUND N/A 04/09/2021   Procedure: OPERATIVE ULTRASOUND;  Surgeon: Gery Pray, MD;  Location: [place];  Service: Urology;  Laterality: N/A;   ROBOTIC ASSISTED TOTAL HYSTERECTOMY N/A 05/21/2021   Procedure: XI ROBOTIC ASSISTED TOTAL HYSTERECTOMY;  Surgeon: [name], MD;  Location: [facility];  Service: Gynecology;  Laterality: N/A;   TANDEM RING INSERTION N/A 04/09/2021   Procedure: TANDEM RING INSERTION;  Surgeon: Gery Pray, MD;  [facility];  Service: Urology;  Laterality: N/A;   TONSILLECTOMY     child   XI ROBOTIC ASSISTED OOPHORECTOMY  N/A 01/24/2021   Procedure: XI ROBOTIC ASSISTED RIGHT OOPHORECTOMY, OOPHOROPEXY OF LEFT OVARY;  Surgeon: [name], MD;  Location: [facility];  Service: Gynecology;  Laterality: N/A;   XI ROBOTIC  ASSISTED OOPHORECTOMY Left 05/21/2021   Procedure: XI ROBOTIC ASSISTED LEFT OOPHORECTOMY;  Surgeon: [name], MD;  Location: [facility];  Service: Gynecology;  Laterality: Left;   XI ROBOTIC ASSISTED SALPINGECTOMY Bilateral 01/24/2021   Procedure: XI ROBOTIC ASSISTED SALPINGECTOMY; RIGHT URETERAL LYSIS; LYSIS OF ADHESIONS;  Surgeon: [name], MD;  Location: [facility];  Service: Gynecology;  Laterality: Bilateral;    Family History  Problem Relation Age of Onset   Asthma Mother    Diabetes Mother    Hypertension Mother    Breast cancer Mother    Asthma Father    Diabetes Father    Kidney disease Father    Diabetes Sister    Asthma Sister    Asthma Brother    Diabetes Brother    Cancer Maternal Aunt    Diabetes Maternal Uncle    Diabetes Maternal Grandmother    Hypertension Maternal Grandmother    Anemia Maternal Grandmother    Heart attack Paternal Grandfather    Cancer Paternal Grandfather    Colon cancer Neg Hx    Ovarian cancer Neg Hx    Endometrial cancer Neg Hx    Pancreatic cancer Neg Hx    Prostate cancer Neg Hx     Social History   Socioeconomic History   Marital status: Single    Spouse name: Not on file   Number of children: Not on file   Years of education: Not on file   Highest education level: Not on file  Occupational History   Not on file  Tobacco Use   Smoking status: Never   Smokeless tobacco: Never  Vaping Use   Vaping Use: Never used  Substance and Sexual Activity   Alcohol use: Never   Drug use: Never   Sexual activity: Not Currently    Birth control/protection: None    Comment: female partner  Other Topics Concern   Not on file  Social History Narrative   Not on file   Social Determinants of Health   Financial Resource Strain: Not on file  Food Insecurity: Not on file  Transportation Needs: Not on file  Physical Activity: Not on file  Stress: Not on file  Social Connections: Not on file    Current  Medications:  Current Outpatient Medications:    albuterol (VENTOLIN HFA) 108 (90 Base) MCG/ACT inhaler, Inhale 1-2 puffs into the lungs every 6 (six) hours as needed for wheezing or shortness of breath., Disp: , Rfl:    Ascorbic Acid (VITAMIN C) 500 MG CHEW, 1 capsule in the evening with iron tablets Orally Once a day for 90 days, Disp: , Rfl:    estradiol (ESTRACE) 1 MG tablet, Take 1 tablet (1 mg total) by mouth daily., Disp: 60 tablet, Rfl: 6   ferrous sulfate 325 (65 FE) MG tablet, Take 325 mg by mouth once a week., Disp: , Rfl:    ibuprofen (ADVIL) 200 MG tablet, Take 200 mg by mouth 2 (two) times daily as needed for headache (pain)., Disp: , Rfl:    propranolol (INDERAL) 40 MG tablet, Take 40 mg by mouth daily as needed (migraines)., Disp: , Rfl:    sertraline (ZOLOFT) 50 MG tablet, Take 1 tablet (50 mg total) by mouth daily., Disp: 30 tablet, Rfl: 6   metFORMIN (GLUCOPHAGE) 500  MG tablet, Take 1 tablet (500 mg total) by mouth 2 (two) times daily with a meal., Disp: 60 tablet, Rfl: 1  Review of Systems: + anxiety, depression Denies appetite changes, fevers, chills, fatigue, unexplained weight changes. Denies hearing loss, neck lumps or masses, mouth sores, ringing in ears or voice changes. Denies cough or wheezing.  Denies shortness of breath. Denies chest pain or palpitations. Denies leg swelling. Denies abdominal distention, pain, blood in stools, constipation, diarrhea, nausea, vomiting, or early satiety. Denies pain with intercourse, dysuria, frequency, hematuria or incontinence. Denies hot flashes, pelvic pain, vaginal bleeding or vaginal discharge.   Denies joint pain, back pain or muscle pain/cramps. Denies itching, rash, or wounds. Denies dizziness, headaches, numbness or seizures. Denies swollen lymph nodes or glands, denies easy bruising or bleeding. Denies confusion, or decreased concentration.  Physical Exam: BP 132/87 (BP Location: Left Wrist, Patient Position:  Sitting)   Pulse 90   Temp 98.4 F (36.9 C) (Oral)   Resp 16   Wt (!) 303 lb 9.6 oz (137.7 kg)   LMP 03/25/2021 (Approximate)   SpO2 100%   BMI 47.65 kg/m  General: Alert, oriented, no acute distress. HEENT: Normocephalic, atraumatic, sclera anicteric. Chest: Clear to auscultation bilaterally.  No wheezes or rhonchi. Cardiovascular: Regular rate and rhythm, no murmurs. Abdomen: Obese, soft, nontender.  Normoactive bowel sounds.  No masses or hepatosplenomegaly appreciated.  Well-healed incisions. Extremities: Grossly normal range of motion.  Warm, well perfused.  No edema bilaterally. Skin: No rashes or lesions noted. GU: Normal appearing external genitalia without erythema, excoriation, or lesions.  Speculum exam reveals somewhat atrophic vaginal mucosa with radiation changes present.  Cuff intact, no lesions visible.  Bimanual exam reveals cuff intact, no masses or nodularity.  This is confirmed on rectovaginal exam.   Laboratory & Radiologic Studies: None new  Assessment & Plan: Alyssa Becker is a 27 y.o. 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  PDL1 10%.   Patient is overall doing well and is NED on exam.   We reviewed most recent imaging from this week.  Overall, no evidence of recurrent cancer.  I am recommending that we repeat imaging in 6 months.    Since her last visit with me, the patient stopped her estrogen patch.  She noted that this was making her feel tired.  I stressed the importance of having her on some estrogen replacement.  We discussed trying estrogen pill.  She preferred this to the patch.  I have sent in a new prescription for estrogen replacement.  Given symptoms of depression and anxiety, I suspect getting her back on estrogen will help with these.  I have asked her to restart  the estrogen.  I am also sending a prescription for sertraline to her pharmacy.  If she does not notice improvement in anxiety and depression after 4-6 weeks, I have asked her to start the sertraline.   Discussed CT findings of severe fatty liver.  Patient and I discussed her current diet and exercise.  She is working out once a week including some cardio.  She is trying to eat healthy including salads, limiting fried food.  She denies eating any fast food.  We discussed weight loss options including tracking her own food, referral to a weight loss program, consideration of bariatric surgery.  She had previously been on Ozempic but did not like how this  made her feel so she stopped it.  I sent her the number to call to get scheduled for a seminar about bariatric surgery.  Will have my office reach out to her next week to review steps that she needs to take to get scheduled for an in person or online seminar.  Per NCCN surveillance recommendations, we will continue with visits every 3 months.  These will include a pelvic exam.  Pap test and HPV testing will performed yearly (due in April 2024).    22 minutes of total time was spent for this patient encounter, including preparation, face-to-face counseling with the patient and coordination of care, and documentation of the encounter.  Jeral Pinch, MD  Division of Gynecologic Oncology  Department of Obstetrics and Gynecology  [facility]

## 2022-03-14 NOTE — Patient Instructions (Addendum)
It was good to see you today.  I do not see or feel any evidence of cancer recurrence on your exam.  Please restart the hormone replacement therapy.  I have sent a prescription for an estrogen pill to your pharmacy.  Please take this for 4-6 weeks.  If your mood symptoms do not improve, then you can start the antidepressant medication.  I am sending a prescription for an antidepression medication to your pharmacy.  Please start this and let me know in 6 weeks or so how you are feeling.  We can always go up on the dose if you feel like it is helping but not enough.  I will see you for follow-up in 3 months.  As always, if you develop any new and concerning symptoms before your next visit, please call to see me sooner.

## 2022-03-24 ENCOUNTER — Inpatient Hospital Stay: Payer: Medicaid Other | Attending: Gynecologic Oncology | Admitting: Licensed Clinical Social Worker

## 2022-03-24 NOTE — Progress Notes (Signed)
[place] [facility]  Clinical Social Work was referred by medical provider for assessment of psychosocial needs.  Clinical Social Worker contacted patient by phone  to offer support and assess for needs.    Patient endorses increased anxiety, especially within the last 2 years, particularly when going out to crowded stores. She used to be able to do deep breathing and manage it, but now becomes overwhelmed and sometimes cries. She has started taking the Sertraline prescribed by Dr. Berline Lopes and said this is helpful. CSW discussed additional support options, namely counseling, and offered assistance in identifying a therapist. Patient would like to think about it at this time. States she has great support from her aunt, mom, and other family and has other coping skills like journaling.  CSW provided direct contact information and encouraged pt to call if she decides to try counseling and needs help finding a therapist   Alyssa Becker E Floydene Flock, [place]        Patient is participating in a Managed Medicaid Plan:  Yes

## 2022-05-17 ENCOUNTER — Ambulatory Visit
Admission: EM | Admit: 2022-05-17 | Discharge: 2022-05-17 | Disposition: A | Discharge status: Home / Self Care | Payer: Medicaid Other | Attending: Nurse Practitioner | Admitting: Nurse Practitioner

## 2022-05-17 DIAGNOSIS — R059 Cough, unspecified: Secondary | ICD-10-CM | POA: Diagnosis not present

## 2022-05-17 DIAGNOSIS — R42 Dizziness and giddiness: Secondary | ICD-10-CM | POA: Diagnosis not present

## 2022-05-17 DIAGNOSIS — Z8709 Personal history of other diseases of the respiratory system: Secondary | ICD-10-CM

## 2022-05-17 DIAGNOSIS — J069 Acute upper respiratory infection, unspecified: Secondary | ICD-10-CM

## 2022-05-17 MED ORDER — PSEUDOEPH-BROMPHEN-DM 30-2-10 MG/5ML PO SYRP: 5.0000 mL | ORAL_SOLUTION | Freq: Four times a day (QID) | ORAL | 0 refills | Status: DC | PRN

## 2022-05-17 MED ORDER — AZITHROMYCIN 250 MG PO TABS: 250.0000 mg | ORAL_TABLET | Freq: Every day | ORAL | 0 refills | Status: DC

## 2022-05-17 MED ORDER — MONTELUKAST SODIUM 10 MG PO TABS: 10.0000 mg | ORAL_TABLET | Freq: Every day | ORAL | 0 refills | Status: DC

## 2022-05-17 MED ORDER — ALBUTEROL SULFATE HFA 108 (90 BASE) MCG/ACT IN AERS: 2.0000 | INHALATION_SPRAY | Freq: Four times a day (QID) | RESPIRATORY_TRACT | 0 refills | Status: AC | PRN

## 2022-05-17 NOTE — Discharge Instructions (Signed)
Take medication as prescribed. May take over-the-counter Tylenol or ibuprofen as needed for pain, fever, or general discomfort. Increase fluids and allow for plenty of rest.  You should be drinking at least 8-10 8 ounce glasses of water daily. Recommend using a humidifier in your bedroom at nighttime during sleep and sleeping elevated on pillows while cough symptoms persist. If your dizziness worsens, recommend following up in the emergency department for further evaluation. We are providing you information for a local primary care physician in the area who is excepting patients, please call and schedule an appointment as soon as possible. If you develop worsening shortness of breath, difficulty breathing, become unable to speak in a complete sentence, or other concerns, please go to the emergency department immediately. Follow-up as needed.

## 2022-05-17 NOTE — ED Provider Notes (Signed)
[facility]    CSN: [id number] Arrival date & time: 05/17/22  7829      History   Chief Complaint No chief complaint on file.   HPI Alyssa Becker is a 27 y.o. female.   The history is provided by the patient.   The patient presents for complaints of nasal congestion, cough, left-sided rib cage pain, and dizziness that started over the past week.  Patient states that the cough is deep, and has been persistent since it started.  Patient reports that she does have a history of asthma.  She reports that she has been wheezing intermittently.  She also states that she has had intermittent shortness of breath.  She states that she has the left-sided rib cage pain with the cough.  She denies fever, chills, sore throat, headache, runny nose, ear drainage, difficulty breathing, chest pain, abdominal pain, nausea, vomiting, or diarrhea.  Patient states that she is noted that she also becomes dizzy at times when she is standing.  She states that this also started with a cough.  She states that she currently is drinking about 2 bottles of water per day.  She denies any lightheadedness, but does state that she has had intermittent headache.  Patient denies numbness, tingling, or weakness.  Past Medical History:  Diagnosis Date   Anxiety    Asthma    Depression    History of chemotherapy    02-22-2021  to 03-22-2021  for cervical cancer   History of COVID-19 02/2020   followed by pcp   (04-03-2021  per pt checks blood sugar once weekly)   History of external beam radiation therapy    02-25-2021  to 03-29-2021 for cervical cancer   History of radiation therapy    cervix - tandem and ring 04/09/2021  Dr Antony Blackbird   Iron deficiency anemia due to chronic blood loss    Malignant neoplasm cervix [facility])    oncologist--- dr gorsuch// radiation onologist-- dr Roselind Messier;   dx 01-16-2021;   01-24-2021  s/p robotic RSO and left salpingectomy for right ovarian mass (endometrioid carcinoma);   completed chemo 03-22-2021 and IMRT 03-29-2021 (scheduled for high dose brachytherapy 04-09-2021)   Migraines    migraine   Morbid obesity (HCC)    Type 2 diabetes mellitus [facility])     Patient Active Problem List   Diagnosis Date Noted   Hot flashes due to surgical menopause 07/22/2021   Port-A-Cath in place 07/22/2021   Diarrhea 03/14/2021   Weight loss 03/14/2021   Malignant neoplasm of cervix (HCC) 01/16/2021   BMI 45.0-49.9, adult (HCC) 01/16/2021   Diabetes mellitus without complication (HCC) 10/01/2020   Right pyosalpinx 10/01/2020   Morbid obesity (HCC)    Microcytic anemia 04/07/2019    Past Surgical History:  Procedure Laterality Date   IR IMAGING GUIDED PORT INSERTION  02/13/2021   IR REMOVAL TUN ACCESS W/ PORT W/O FL MOD SED  10/28/2021   OPERATIVE ULTRASOUND N/A 04/09/2021   Procedure: OPERATIVE ULTRASOUND;  Surgeon: Antony Blackbird, MD;  Location: [facility] [place];  Service: Urology;  Laterality: N/A;   ROBOTIC ASSISTED TOTAL HYSTERECTOMY N/A 05/21/2021   Procedure: XI ROBOTIC ASSISTED TOTAL HYSTERECTOMY;  Surgeon: Carver Fila, MD;  Location: [facility];  Service: Gynecology;  Laterality: N/A;   TANDEM RING INSERTION N/A 04/09/2021   Procedure: TANDEM RING INSERTION;  Surgeon: Antony Blackbird, MD;  Location: [facility];  Service: Urology;  Laterality: N/A;   TONSILLECTOMY  child   XI ROBOTIC ASSISTED OOPHORECTOMY N/A 01/24/2021   Procedure: XI ROBOTIC ASSISTED RIGHT OOPHORECTOMY, OOPHOROPEXY OF LEFT OVARY;  Surgeon: Carver Fila, MD;  Location: [facility];  Service: Gynecology;  Laterality: N/A;   XI ROBOTIC ASSISTED OOPHORECTOMY Left 05/21/2021   Procedure: XI ROBOTIC ASSISTED LEFT OOPHORECTOMY;  Surgeon: Carver Fila, MD;  Location: [facility];  Service: Gynecology;  Laterality: Left;   XI ROBOTIC ASSISTED SALPINGECTOMY Bilateral 01/24/2021   Procedure: XI ROBOTIC ASSISTED SALPINGECTOMY; RIGHT URETERAL LYSIS; LYSIS OF ADHESIONS;  Surgeon:  Carver Fila, MD;  Location: [facility];  Service: Gynecology;  Laterality: Bilateral;    OB History     Gravida  0   Para  0   Term  0   Preterm  0   AB  0   Living  0      SAB  0   IAB  0   Ectopic  0   Multiple  0   Live Births  0            Home Medications    Prior to Admission medications   Medication Sig Start Date End Date Taking? Authorizing Provider  albuterol (VENTOLIN HFA) 108 (90 Base) MCG/ACT inhaler Inhale 2 puffs into the lungs every 6 (six) hours as needed for wheezing or shortness of breath. 05/17/22  Yes Almer Littleton-Warren, Sadie Haber, NP  azithromycin (ZITHROMAX) 250 MG tablet Take 1 tablet (250 mg total) by mouth daily. Take first 2 tablets together, then 1 every day until finished. 05/17/22  Yes Shona Pardo-Warren, Sadie Haber, NP  brompheniramine-pseudoephedrine-DM 30-2-10 MG/5ML syrup Take 5 mLs by mouth 4 (four) times daily as needed. 05/17/22  Yes Shemeka Wardle-Warren, Sadie Haber, NP  montelukast (SINGULAIR) 10 MG tablet Take 1 tablet (10 mg total) by mouth at bedtime. 05/17/22  Yes Duvall Comes-Warren, Sadie Haber, NP  Ascorbic Acid (VITAMIN C) 500 MG CHEW 1 capsule in the evening with iron tablets Orally Once a day for 90 days    [provider]  estradiol (ESTRACE) 1 MG tablet Take 1 tablet (1 mg total) by mouth daily. 03/14/22   Carver Fila, MD  ferrous sulfate 325 (65 FE) MG tablet Take 325 mg by mouth once a week.    [provider]  ibuprofen (ADVIL) 200 MG tablet Take 200 mg by mouth 2 (two) times daily as needed for headache (pain).    [provider]  metFORMIN (GLUCOPHAGE) 500 MG tablet Take 1 tablet (500 mg total) by mouth 2 (two) times daily with a meal. 10/03/20   Hermina Staggers, MD  propranolol (INDERAL) 40 MG tablet Take 40 mg by mouth daily as needed (migraines).    [provider]  sertraline (ZOLOFT) 50 MG tablet Take 1 tablet (50 mg total) by mouth daily. 03/14/22   Carver Fila, MD  prochlorperazine  (COMPAZINE) 10 MG tablet Take 1 tablet (10 mg total) by mouth every 6 (six) hours as needed (Nausea or vomiting). Patient not taking: Reported on 02/18/2021 02/05/21 03/28/21  Artis Delay, MD    Family History Family History  Problem Relation Age of Onset   Asthma Mother    Diabetes Mother    Hypertension Mother    Breast cancer Mother    Asthma Father    Diabetes Father    Kidney disease Father    Diabetes Sister    Asthma Sister    Asthma Brother    Diabetes Brother    Cancer Maternal Aunt  Diabetes Maternal Uncle    Diabetes Maternal Grandmother    Hypertension Maternal Grandmother    Anemia Maternal Grandmother    Heart attack Paternal Grandfather    Cancer Paternal Grandfather    Colon cancer Neg Hx    Ovarian cancer Neg Hx    Endometrial cancer Neg Hx    Pancreatic cancer Neg Hx    Prostate cancer Neg Hx     Social History Social History   Tobacco Use   Smoking status: Never   Smokeless tobacco: Never  Vaping Use   Vaping Use: Never used  Substance Use Topics   Alcohol use: Never   Drug use: Never     Allergies   Pineapple and Prednisone   Review of Systems Review of Systems Per HPI  Physical Exam Triage Vital Signs ED Triage Vitals  Enc Vitals Group     BP 05/17/22 0837 130/81     Pulse Rate 05/17/22 0837 87     Resp 05/17/22 0837 20     Temp 05/17/22 0837 97.7 F (36.5 C)     Temp Source 05/17/22 0837 Oral     SpO2 05/17/22 0837 98 %     Weight --      Height --      Head Circumference --      Peak Flow --      Pain Score 05/17/22 0838 1     Pain Loc --      Pain Edu? --      Excl. in GC? --    Orthostatic VS for the past 24 hrs:  BP- Lying Pulse- Lying BP- Sitting Pulse- Sitting BP- Standing at 0 minutes Pulse- Standing at 0 minutes  05/17/22 0905 117/81 88 124/72 93 118/78 92    Updated Vital Signs BP 130/81 (BP Location: Right Arm)   Pulse 87   Temp 97.7 F (36.5 C) (Oral)   Resp 20   LMP 03/25/2021 (Approximate)   SpO2  98%   Visual Acuity Right Eye Distance:   Left Eye Distance:   Bilateral Distance:    Right Eye Near:   Left Eye Near:    Bilateral Near:     Physical Exam Vitals and nursing note reviewed.  Constitutional:      General: She is not in acute distress.    Appearance: Normal appearance.  HENT:     Head: Normocephalic.     Right Ear: Tympanic membrane, ear canal and external ear normal.     Left Ear: Tympanic membrane, ear canal and external ear normal.     Nose: Congestion present.     Mouth/Throat:     Mouth: Mucous membranes are moist.     Pharynx: Posterior oropharyngeal erythema present.  Eyes:     Extraocular Movements: Extraocular movements intact.     Conjunctiva/sclera: Conjunctivae normal.     Pupils: Pupils are equal, round, and reactive to light.  Cardiovascular:     Rate and Rhythm: Normal rate and regular rhythm.     Pulses: Normal pulses.     Heart sounds: Normal heart sounds.  Pulmonary:     Effort: Pulmonary effort is normal. No respiratory distress.     Breath sounds: Normal breath sounds. No stridor. No wheezing, rhonchi or rales.  Abdominal:     General: Bowel sounds are normal.     Palpations: Abdomen is soft.     Tenderness: There is no abdominal tenderness.  Musculoskeletal:     Cervical back: Normal range of  motion.  Lymphadenopathy:     Cervical: No cervical adenopathy.  Skin:    General: Skin is warm and dry.  Neurological:     General: No focal deficit present.     Mental Status: She is alert and oriented to person, place, and time.     GCS: GCS eye subscore is 4. GCS verbal subscore is 5. GCS motor subscore is 6.     Cranial Nerves: Cranial nerves 2-12 are intact.     Motor: Motor function is intact.     Coordination: Coordination is intact.  Psychiatric:        Mood and Affect: Mood normal.        Behavior: Behavior normal.      UC Treatments / Results  Labs (all labs ordered are listed, but only abnormal results are  displayed) Labs Reviewed - No data to display  EKG   Radiology No results found.  Procedures Procedures (including critical care time)  Medications Ordered in UC Medications - No data to display  Initial Impression / Assessment and Plan / UC Course  I have reviewed the triage vital signs and the nursing notes.  Pertinent labs & imaging results that were available during my care of the patient were reviewed by me and considered in my medical decision making (see chart for details).  The patient is well-appearing, she is in no acute distress, vital signs are stable.  Suspect an acute asthma exacerbation given the persistent cough.  On exam, patient does not have any wheezing, lung sounds are clear throughout.  Based on the persistence of the cough, and patient has an allergy to prednisone, will treat with azithromycin 250 mg to cover empirically for infection, Bromfed-DM to help with her cough, Singulair 10 mg at bedtime for control of her allergies and asthma, and provide a refill for her albuterol inhaler.  Supportive care recommendations were provided and discussed with the patient to include increasing her fluid intake.  She needs to drink at least 8-10 8 ounce glasses of water daily.  She is negative for orthostatic hypotension, suspect she needs to increase her fluid intake, which should help with her dizziness.  Patient also advised to use a humidifier in her bedroom at nighttime during sleep and to sleep elevated on pillows while cough symptoms persist.  Patient was given strict ER follow-up precautions.  Patient was also given information for local primary care office to establish care so she can have continued control and maintenance for her asthma.  Patient is in agreement with this plan of care and verbalizes understanding.  All questions were answered.  Patient stable for discharge.   Final Clinical Impressions(s) / UC Diagnoses   Final diagnoses:  Acute upper respiratory  infection  History of asthma  Dizziness  Cough, unspecified type     Discharge Instructions      Take medication as prescribed. May take over-the-counter Tylenol or ibuprofen as needed for pain, fever, or general discomfort. Increase fluids and allow for plenty of rest.  You should be drinking at least 8-10 8 ounce glasses of water daily. Recommend using a humidifier in your bedroom at nighttime during sleep and sleeping elevated on pillows while cough symptoms persist. If your dizziness worsens, recommend following up in the emergency department for further evaluation. We are providing you information for a local primary care physician in the area who is excepting patients, please call and schedule an appointment as soon as possible. If you develop worsening shortness of  breath, difficulty breathing, become unable to speak in a complete sentence, or other concerns, please go to the emergency department immediately. Follow-up as needed.      ED Prescriptions     Medication Sig Dispense Auth. Provider   albuterol (VENTOLIN HFA) 108 (90 Base) MCG/ACT inhaler Inhale 2 puffs into the lungs every 6 (six) hours as needed for wheezing or shortness of breath. 8 g Matelyn Antonelli-Warren, Sadie Haber, NP   azithromycin (ZITHROMAX) 250 MG tablet Take 1 tablet (250 mg total) by mouth daily. Take first 2 tablets together, then 1 every day until finished. 6 tablet Charlese Gruetzmacher-Warren, Sadie Haber, NP   brompheniramine-pseudoephedrine-DM 30-2-10 MG/5ML syrup Take 5 mLs by mouth 4 (four) times daily as needed. 140 mL Malayiah Mcbrayer-Warren, Sadie Haber, NP   montelukast (SINGULAIR) 10 MG tablet Take 1 tablet (10 mg total) by mouth at bedtime. 30 tablet Mesha Schamberger-Warren, Sadie Haber, NP      PDMP not reviewed this encounter.   Abran Cantor, NP 05/17/22 [phone]

## 2022-05-17 NOTE — ED Triage Notes (Addendum)
Pt c/o dry hard cough causing pain on the left side near rib cage and right ear pain, dizziness with standing  x 1 week   Pt has taken day/ NyQuil for the cough, has not helped

## 2022-06-13 ENCOUNTER — Other Ambulatory Visit (HOSPITAL_COMMUNITY)
Admission: RE | Admit: 2022-06-13 | Discharge: 2022-06-13 | Disposition: A | Discharge status: Home / Self Care | Payer: Medicaid Other | Source: Ambulatory Visit | Attending: Gynecologic Oncology | Admitting: Gynecologic Oncology

## 2022-06-13 ENCOUNTER — Encounter: Payer: Self-pay | Admitting: Gynecologic Oncology

## 2022-06-13 ENCOUNTER — Inpatient Hospital Stay: Payer: Medicaid Other | Attending: Gynecologic Oncology | Admitting: Gynecologic Oncology

## 2022-06-13 VITALS — BP 129/40 | HR 108 | Temp 98.0°F | Resp 16 | Ht 66.14 in | Wt 301.4 lb

## 2022-06-13 DIAGNOSIS — Z79899 Other long term (current) drug therapy: Secondary | ICD-10-CM | POA: Insufficient documentation

## 2022-06-13 DIAGNOSIS — Z8616 Personal history of COVID-19: Secondary | ICD-10-CM | POA: Diagnosis not present

## 2022-06-13 DIAGNOSIS — Z809 Family history of malignant neoplasm, unspecified: Secondary | ICD-10-CM | POA: Insufficient documentation

## 2022-06-13 DIAGNOSIS — Z79890 Hormone replacement therapy (postmenopausal): Secondary | ICD-10-CM | POA: Insufficient documentation

## 2022-06-13 DIAGNOSIS — Z8541 Personal history of malignant neoplasm of cervix uteri: Secondary | ICD-10-CM | POA: Diagnosis present

## 2022-06-13 DIAGNOSIS — Z9221 Personal history of antineoplastic chemotherapy: Secondary | ICD-10-CM | POA: Insufficient documentation

## 2022-06-13 DIAGNOSIS — R197 Diarrhea, unspecified: Secondary | ICD-10-CM

## 2022-06-13 DIAGNOSIS — C539 Malignant neoplasm of cervix uteri, unspecified: Secondary | ICD-10-CM | POA: Insufficient documentation

## 2022-06-13 DIAGNOSIS — E8940 Asymptomatic postprocedural ovarian failure: Secondary | ICD-10-CM

## 2022-06-13 DIAGNOSIS — Z923 Personal history of irradiation: Secondary | ICD-10-CM | POA: Diagnosis not present

## 2022-06-13 DIAGNOSIS — Z90710 Acquired absence of both cervix and uterus: Secondary | ICD-10-CM | POA: Insufficient documentation

## 2022-06-13 DIAGNOSIS — F32A Depression, unspecified: Secondary | ICD-10-CM | POA: Diagnosis not present

## 2022-06-13 DIAGNOSIS — Z90722 Acquired absence of ovaries, bilateral: Secondary | ICD-10-CM | POA: Diagnosis not present

## 2022-06-13 DIAGNOSIS — Z803 Family history of malignant neoplasm of breast: Secondary | ICD-10-CM | POA: Insufficient documentation

## 2022-06-13 MED ORDER — SERTRALINE HCL 50 MG PO TABS: 50.0000 mg | ORAL_TABLET | Freq: Every day | ORAL | 6 refills | Status: DC

## 2022-06-13 NOTE — Progress Notes (Signed)
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 Large indeterminate right adnexal mass with solid hypervascular and cystic components. This appears separate from the base of the appendix on preceding CT and is likely arising from the right ovary based on relationship to the gonadal vessels on CT. Given the patient's young age, hypervascularity and leukocytosis, findings could represent an atypical tubo-ovarian abscess. However, the appearance is concerning for ovarian malignancy despite the patient's young age. Recommend prompt gynecology consultation. Pelvic MRI without and with contrast may be helpful for further evaluation. 2. The left ovary appears normal. Mild nonspecific thickening of the endometrium, within physiologic limits.   01/02/2021 Pathology Results   SPECIMEN ADEQUACY: Satisfactory for evaluation; transformation zone component PRESENT. INTERPRETATION: - Adenocarcinoma, NOS   01/15/2021 Imaging   MRI pelvis  13.0 cm cystic and solid mass arising from the right ovary, highly suspicious for ovarian carcinoma.   Mild endometrial thickening and 4.5 cm soft tissue mass within the endocervical canal. Differential diagnosis includes prolapsing endometrial polyp or carcinoma, or primary cervical carcinoma.   No evidence of pelvic metastatic disease.   01/16/2021 Initial Diagnosis   Malignant neoplasm of cervix (HCC)   01/22/2021 PET scan   1. Large cystic/necrotic right adnexal mass is markedly hypermetabolic and consistent with known neoplasm. 2. Extensive hypermetabolic tumor involving the endometrium and cervix. 3. Scattered borderline enlarged and  mildly hypermetabolic retroperitoneal and external iliac nodes. 4. No findings for metastatic disease involving the chest or bony structures.   01/24/2021 Surgery   Pre-operative Diagnosis: Complex adnexal mass, elevated CA-125, locally invasive cervical adenocarcinoma versus endometrial adenocarcinoma   Post-operative Diagnosis: same, high-grade carcinoma of the right ovary, suspected history of superinfection of the adnexal mass   Operation: Robotic-assisted laparoscopic bilateral salpingectomy, right oophorectomy, lysis of adhesions of approximately 45 minutes, right ureterolysis   Surgeon: Eugene Garnet MD    Operative Findings: On EUA, cervix enlarged and firm, fleshy tumor replacing the endocervix.  No definitive parametrial involvement appreciated.  Even gentle exam because significant bleeding so vagina was packed with lap sponges for the remainder of surgery.  On intra-abdominal exam, normal upper abdominal survey including omentum, liver edge, diaphragm, and stomach.  Normal small bowel.  Some physiologic adhesions of the sigmoid epiploica to the left sidewall.  Normal-appearing left ovary measuring approximately 3-4 cm.  Normal-appearing bilateral fallopian tubes.  Uterus approximately 8-10 cm and overall normal in appearance.  Right ovary replaced by a smooth but vascular 10 cm mass with multiple loculations.  Ovary with dense adhesions to the right pelvic sidewall.  Upon very careful manipulation of the mass for its removal, there was some drainage of fluid noted that was somewhat purulent.  This was sent for culture and Gram stain.  Upon contained mass removal with fractionation of the mass, tumor versus necrotic material was noted.  Frozen section consistent with high-grade carcinoma, unable to differentiate on frozen section whether this is the same cancer being seen on her cervical biopsies.  Right ureterolysis performed given oozing services from the broad ligament and peritoneum  where adhesions were lysed between the ovary and peritoneum, to assure ureter not in close proximity.  No obvious adenopathy.  No ascites.  Left ovary transposed out of the pelvis along the left gutter, clips placed along the sidewall as well as the superior and inferior aspect of the ovary.     01/24/2021 Pathology Results   FINAL MICROSCOPIC DIAGNOSIS:   A. OVARY AND FALLOPIAN TUBE, RIGHT, SALPINGO OOPHORECTOMY:  - Endometrioid carcinoma, moderately differentiated, involving right ovary  - No evidence of ovarian surface involvement by carcinoma  - Segment of fallopian tube, negative for carcinoma  - See oncology table  - See comment   B. FALLOPIAN TUBE, LEFT, SALPINGECTOMY:  - Segment of fallopian tube, negative for carcinoma   A.  Immunostain for p16 shows patchy staining.  Immunostain for p53 shows a normal, wild-type expression pattern.  This immunoprofile is consistent with above interpretation.  Patient's previous cervical  biopsy was reviewed - it shows moderately differentiated carcinoma with a very similar histomorphology and very likely presents metastasis from the ovarian tumor to the cervix.    02/05/2021 Cancer Staging   Staging form: Cervix Uteri, AJCC Version 9 - Pathologic stage from 02/05/2021: FIGO Stage II (pT2, pN0, cM0) - Signed by Artis Delay, MD on 02/05/2021 Stage prefix: Initial diagnosis   02/14/2021 Procedure   Successful placement of a right internal jugular approach power injectable Port-A-Cath. The catheter is ready for immediate use   02/22/2021 - 03/22/2021 Chemotherapy   Patient is on Treatment Plan : Cervical cancer Cisplatin q7d     02/25/2021 - 03/29/2021 Radiation Therapy   Radiation Treatment Dates: 02/25/2021 through 04/09/2021 Pelvic IMRT: 02/25/21 through 03/29/21 HDR-brachy: 04/09/21 Site Technique Total Dose (Gy) Dose per Fx (Gy) Completed Fx Beam Energies  Pelvis: Pelvis IMRT 45/45 1.8 25/25 6X  Cervix: Cervix_Bst_Fx1 HDR-brachy 5.5/5.5 5.5 1/1 Ir-192          05/13/2021 Imaging   1. Status post bilateral oophorectomy. Previously seen large hypermetabolic right adnexal mass has been resected. Nodular soft tissue in the vicinity of both the left and right oophorectomy bed, likely postoperative in nature. Attention on follow-up. 2. Previously noted uterine/cervical mass is resolved, consistent with treatment response. 3. Prominent, previously FDG avid iliac and retroperitoneal lymph nodes are slightly diminished in size, consistent with treatment response. 4. Hypodense liver lesion of hepatic segment IVa measuring 1.5 x 1.2 cm, not previously appreciated by contrast enhanced CT or PET-CT, incompletely characterized although worrisome for hepatic metastasis. This could be further characterized by multiphasic contrast enhanced MRI. 5. Hepatomegaly and hepatic steatosis. 6. Splenomegaly.     05/21/2021 Surgery   Robotic-assisted modified radical hysterectomy with left oophorectomy, cystoscopy  Findings:  On EUA, some radiation changes noted with deviation of the cervix posteriorly. On intra-abdominal entry, normal upper abdominal survey. Normal omentum, small and large bowel. Surgically absent right adnexa. Left ovary pexed along sidewall above the pelvic brim (normal in appearance). Uterus 8-10 cm and somewhat bulbous. Significant retroperitoneal edema, some fibrosis. 2-3 cm tumor implant along the right parametrium and distal uterosacral ligament, free from the cervix itself. No obvious paracervical tumor extension. No gross adenopathy. No ascites.  On cystoscopy, bladder intact, good efflux noted from bilateral ureteral orifices.   05/21/2021 Pathology Results   A. UTERUS AND CERVIX, HYSTERECTOMY:  - Microscopic fragment in the upper endocervical canal suspicious for  treated adenocarcinoma associated with extensive treatment response.  - Fibrosis and cytologic atypia in the endometrium consistent with  treatment effects.  - See comment.   B.  PERIMETRIUM, RIGHT, EXCISION  - Amorphous material associated with histiocytic response.  - Connective tissue  with inflammation, fibrosis and dystrophic  calcifications.  - No malignancy identified.   C. OVARY, LEFT, OOPHORECTOMY:  - Benign ovary.  - No malignancy identified.   COMMENT:  There is a 1.7 cm polypoid mass involving the anterior endocervical canal which histologically shows dense fibrosis with scattered glands with cytologic atypia consistent with therapy effects.  This area has a microscopic, partially detached fragment with marked atypia and architectural changes suspicious for microscopic residual adenocarcinoma.  Elsewhere, the endometrium shows foci of similar fibrosis with patchy cytologic atypia consistent with therapy effects.  There are foci where fibrosis extends into the adjacent upper portions of the myometrium.  There is insufficient residual suspicious tissue present to resolve the location of the primary in this case.  The pattern of fibrotic apparent therapy affects is more suggestive of an ovarian metastasis from an endocervical or endometrial primary.    07/16/2021 PET scan   1. Interval hysterectomy with mild residual uptake in the surgical bed. This examination will serve as baseline for future comparison. No evidence of distant metastatic disease. 2. Relatively patchy/mottled uptake throughout the spine may be treatment related. No definite focal lesion. 3. Severely enlarged steatotic liver. 4. Splenomegaly.   10/28/2021 Procedure   Successful removal of Port-A-Cath without immediate post procedural complication.     Interval History: The patient reports overall doing well.  She was recently diagnosed and treated for a respiratory infection in the setting of her asthma.  Continues to have some wheezing all this is improving.  She denies any abdominal or pelvic pain.  Denies any vaginal bleeding or discharge.  Reports baseline bladder function.  Has some fecal  urgency and intermittent diarrhea.  Describes this as having to go to the bathroom frequently a couple minutes after she finishes eating.  Past Medical/Surgical History: Past Medical History:  Diagnosis Date   Anxiety    Asthma    Depression    History of chemotherapy    02-22-2021  to 03-22-2021  for cervical cancer   History of COVID-19 02/2020   followed by pcp   (04-03-2021  per pt checks blood sugar once weekly)   History of external beam radiation therapy    02-25-2021  to 03-29-2021 for cervical cancer   History of radiation therapy    cervix - tandem and ring 04/09/2021  Dr Antony Blackbird   Iron deficiency anemia due to chronic blood loss    Malignant neoplasm cervix [facility])    oncologist--- dr gorsuch// radiation onologist-- dr Roselind Messier;   dx 01-16-2021;   01-24-2021  s/p robotic RSO and left salpingectomy for right ovarian mass (endometrioid carcinoma);  completed chemo 03-22-2021 and IMRT 03-29-2021 (scheduled for high dose brachytherapy 04-09-2021)   Migraines    migraine   Morbid obesity (HCC)    Type 2 diabetes mellitus (HCC)     Past Surgical History:  Procedure Laterality Date   IR IMAGING GUIDED PORT INSERTION  02/13/2021   IR REMOVAL TUN ACCESS W/ PORT W/O FL MOD SED  10/28/2021   OPERATIVE ULTRASOUND N/A 04/09/2021   Procedure: OPERATIVE ULTRASOUND;  Surgeon: Antony Blackbird, MD;  Location: [facility];  Service: Urology;  Laterality: N/A;   ROBOTIC ASSISTED TOTAL HYSTERECTOMY N/A 05/21/2021   Procedure: XI ROBOTIC ASSISTED TOTAL HYSTERECTOMY;  Surgeon: Carver Fila, MD;  Location: [facility];  Service: Gynecology;  Laterality: N/A;   TANDEM RING INSERTION N/A 04/09/2021   Procedure: TANDEM RING INSERTION;  Surgeon: Antony Blackbird, MD;  Location: [facility];  Service: Urology;  Laterality: N/A;   TONSILLECTOMY     child   XI ROBOTIC ASSISTED OOPHORECTOMY N/A 01/24/2021   Procedure: XI ROBOTIC ASSISTED RIGHT OOPHORECTOMY, OOPHOROPEXY OF LEFT  OVARY;  Surgeon: Carver Fila, MD;  Location: [facility];  Service: Gynecology;  Laterality: N/A;   XI ROBOTIC ASSISTED OOPHORECTOMY Left 05/21/2021   Procedure: XI ROBOTIC ASSISTED LEFT OOPHORECTOMY;  Surgeon: Carver Fila, MD;  Location: [facility];  Service: Gynecology;  Laterality: Left;   XI ROBOTIC ASSISTED SALPINGECTOMY Bilateral 01/24/2021   Procedure: XI ROBOTIC ASSISTED SALPINGECTOMY; RIGHT URETERAL LYSIS; LYSIS OF ADHESIONS;  Surgeon: Carver Fila, MD;  Location: [facility];  Service: Gynecology;  Laterality: Bilateral;    Family History  Problem Relation Age of Onset   Asthma Mother    Diabetes Mother    Hypertension Mother    Breast cancer Mother    Asthma Father    Diabetes Father    Kidney disease Father    Diabetes Sister    Asthma Sister    Asthma Brother    Diabetes Brother    Cancer Maternal Aunt    Diabetes Maternal Uncle    Diabetes Maternal Grandmother    Hypertension Maternal Grandmother    Anemia Maternal Grandmother    Heart attack Paternal Grandfather    Cancer Paternal Grandfather    Colon cancer Neg Hx    Ovarian cancer Neg Hx    Endometrial cancer Neg Hx    Pancreatic cancer Neg Hx    Prostate cancer Neg Hx     Social History   Socioeconomic History   Marital status: Single    Spouse name: Not on file   Number of children: Not on file   Years of education: Not on file   Highest education level: Not on file  Occupational History   Not on file  Tobacco Use   Smoking status: Never   Smokeless tobacco: Never  Vaping Use   Vaping Use: Never used  Substance and Sexual Activity   Alcohol use: Never   Drug use: Never   Sexual activity: Not Currently    Birth control/protection: None    Comment: female partner  Other Topics Concern   Not on file  Social History Narrative   Not on file   Social Determinants of Health   Financial Resource Strain: Not on file  Food Insecurity: Not on file  Transportation Needs: Not on file   Physical Activity: Not on file  Stress: Not on file  Social Connections: Not on file    Current Medications:  Current Outpatient Medications:    sertraline (ZOLOFT) 50 MG tablet, Take 1 tablet (50 mg total) by mouth daily., Disp: 30 tablet, Rfl: 6   albuterol (VENTOLIN HFA) 108 (90 Base) MCG/ACT inhaler, Inhale 2 puffs into the lungs every 6 (six) hours as needed for wheezing or shortness of breath., Disp: 8 g, Rfl: 0   Ascorbic Acid (VITAMIN C) 500 MG CHEW, 1 capsule in the evening with iron tablets Orally Once a day for 90 days, Disp: , Rfl:    azithromycin (ZITHROMAX) 250 MG tablet, Take 1 tablet (250 mg total) by mouth daily. Take first 2 tablets together, then 1 every day until finished., Disp: 6 tablet, Rfl: 0   brompheniramine-pseudoephedrine-DM 30-2-10 MG/5ML syrup, Take 5 mLs by mouth 4 (four) times daily as needed., Disp: 140 mL, Rfl: 0   estradiol (ESTRACE) 1 MG tablet, Take 1 tablet (1 mg total) by mouth daily.,  Disp: 60 tablet, Rfl: 6   ferrous sulfate 325 (65 FE) MG tablet, Take 325 mg by mouth once a week., Disp: , Rfl:    ibuprofen (ADVIL) 200 MG tablet, Take 200 mg by mouth 2 (two) times daily as needed for headache (pain)., Disp: , Rfl:    metFORMIN (GLUCOPHAGE) 500 MG tablet, Take 1 tablet (500 mg total) by mouth 2 (two) times daily with a meal., Disp: 60 tablet, Rfl: 1   montelukast (SINGULAIR) 10 MG tablet, Take 1 tablet (10 mg total) by mouth at bedtime., Disp: 30 tablet, Rfl: 0   propranolol (INDERAL) 40 MG tablet, Take 40 mg by mouth daily as needed (migraines)., Disp: , Rfl:   Review of Systems: + Wheezing, diarrhea Denies appetite changes, fevers, chills, fatigue, unexplained weight changes. Denies hearing loss, neck lumps or masses, mouth sores, ringing in ears or voice changes. Denies cough.  Denies shortness of breath. Denies chest pain or palpitations. Denies leg swelling. Denies abdominal distention, pain, blood in stools, constipation, nausea, vomiting, or  early satiety. Denies pain with intercourse, dysuria, frequency, hematuria or incontinence. Denies hot flashes, pelvic pain, vaginal bleeding or vaginal discharge.   Denies joint pain, back pain or muscle pain/cramps. Denies itching, rash, or wounds. Denies dizziness, headaches, numbness or seizures. Denies swollen lymph nodes or glands, denies easy bruising or bleeding. Denies anxiety, depression, confusion, or decreased concentration.  Physical Exam: BP (!) 129/40 (BP Location: Left Wrist, Patient Position: Sitting)   Pulse (!) 108   Temp 98 F (36.7 C) (Oral)   Resp 16   Ht 5' 6.14" (1.68 m)   Wt (!) 301 lb 6.4 oz (136.7 kg)   LMP 03/25/2021 (Approximate)   SpO2 98%   BMI 48.44 kg/m  General: Alert, oriented, no acute distress. HEENT: Normocephalic, atraumatic, sclera anicteric. Chest: Clear to auscultation bilaterally.  No wheezes or rhonchi. Cardiovascular: Regular rate and rhythm, no murmurs. Abdomen: Obese, soft, nontender.  Normoactive bowel sounds.  No masses or hepatosplenomegaly appreciated.  Well-healed incisions. Extremities: Grossly normal range of motion.  Warm, well perfused.  No edema bilaterally. Skin: No rashes or lesions noted. GU: Normal appearing external genitalia without erythema, excoriation, or lesions.  Speculum exam reveals somewhat atrophic vaginal mucosa with radiation changes present.  Cuff intact, no lesions visible.  Pap and HPV collected.  Bimanual exam reveals cuff intact, no masses or nodularity.  This is confirmed on rectovaginal exam.   Laboratory & Radiologic Studies: None new  Assessment & Plan: Alyssa Becker is a 27 y.o. 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  PDL1 10%.   Patient is overall doing well and is NED on exam.  Pap and HPV collected today.    We reviewed most recent imaging from this week.  Overall, no evidence of recurrent cancer.  I am recommending that we repeat imaging in 6 months (August 2024), which is already scheduled.  The patient has been on estrogen replacement since her last visit with me.  She notes doing very well.  She denies any menopausal symptoms including hot flashes.    She has struggled some with her depression symptoms since her last visit with me.  At that visit, we had talked about starting her on sertraline which I had sent to her pharmacy.  She tells me today that the pharmacy was not able to fill this.  She is  interested in starting it again.  I will resend the prescription.  I asked her to call me if there is any trouble having this filled at her pharmacy.  I also let her to know if in 6 weeks she does not notice improvement in her symptoms.   We also discussed counseling.  She spoke with Marcelino Duster after her last visit and found this very helpful.  Has thought about but has not reached out to her again.  I will send a message to Marcelino Duster to see if she can connect with the patient again.  Also discussed weight loss again.  The patient is working on drinking mostly water and walking more.  I encouraged her to not just keep track of food and a paper log but to track this on an app.  I suspect that her diarrhea may be related to prior radiation.  We discussed adding some Metamucil and using Imodium as needed.  Can consider GI referral if this does not improve with conservative management.  Per NCCN surveillance recommendations, we will continue with visits every 3 months.  These will include a pelvic exam.  Pap test and HPV testing will performed yearly (performed today).    22 minutes of total time was spent for this patient encounter, including preparation, face-to-face counseling with the patient and coordination of care, and documentation of the encounter.  Eugene Garnet, MD  Division of Gynecologic  Oncology  Department of Obstetrics and Gynecology  [facility]

## 2022-06-13 NOTE — Patient Instructions (Addendum)
It was good to see you today.  I do not see or feel any evidence of cancer recurrence on your exam.  I will see you for follow-up in 3 months.  I will let you know when your pap is back.  As always, if you develop any new and concerning symptoms before your next visit, please call to see me sooner.

## 2022-06-17 ENCOUNTER — Telehealth: Payer: Self-pay | Admitting: Licensed Clinical Social Worker

## 2022-06-17 NOTE — Telephone Encounter (Signed)
[facility] Clinical Social Work  Clinical Social Work was referred by medical provider for assessment of needs related to mental health.  Clinical Social Worker attempted to contact patient by phone to offer support and assess for needs.   No answer. Left VM with direct contact information.     Aseret Hoffman E Stefannie Defeo, LCSW  Clinical Social Worker [facility]        Patient is participating in a Managed Medicaid Plan:  Yes

## 2022-06-20 LAB — CYTOLOGY - PAP
Comment: NEGATIVE
Diagnosis: NEGATIVE
High risk HPV: NEGATIVE

## 2022-06-23 ENCOUNTER — Telehealth: Payer: Self-pay | Admitting: *Deleted

## 2022-06-23 NOTE — Telephone Encounter (Signed)
Per request from [facility]; fax last pap smear results

## 2022-06-25 ENCOUNTER — Telehealth: Payer: Self-pay | Admitting: *Deleted

## 2022-06-25 NOTE — Progress Notes (Signed)
Could you please call patient with her pap results? I released in mychart but doesn't look like she has seen. Thank you

## 2022-06-25 NOTE — Telephone Encounter (Signed)
Spoke with Alyssa Becker in regards to message relayed from Dr. Pricilla Holm that her PAP & HPV results were negative & normal. Also helped patient reset her MyChart password.Patient thanked the office for calling and has no further concerns or questions at this time.

## 2022-06-25 NOTE — Telephone Encounter (Signed)
-----   Message from Carver Fila, MD sent at 06/25/2022  1:25 PM EDT ----- Could you please call patient with her pap results? I released in mychart but doesn't look like she has seen. Thank you

## 2022-06-29 IMAGING — MR MR ABDOMEN WO/W CM
18 series · 48 of 48 positions shown · IV contrast (10 GADAVIST)
Comparison: CT chest abdomen pelvis, 05/13/2021

CLINICAL DATA: Characterize suspicious liver lesion, cervical
versus endometrial cancer

EXAM:
MRI ABDOMEN WITHOUT AND WITH CONTRAST
TECHNIQUE: Multiplanar multisequence MR imaging of the abdomen was performed
both before and after the administration of intravenous contrast.
CONTRAST:  10mL GADAVIST GADOBUTROL 1 MMOL/ML IV SOLN

[Series 2: DWI · axial · 6.0mm · 1.77mm/px · z∈[-142,+190]mm · 4 of 94 slices shown (1 of 2)]
[im 1/94]
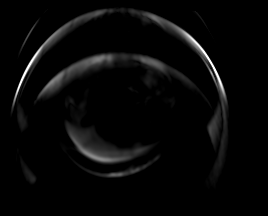
[im 32/94]
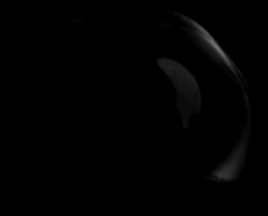
[im 63/94]
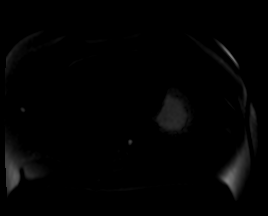
[im 94/94]
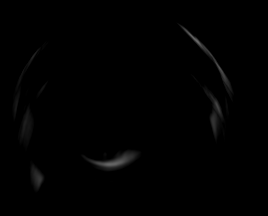

[Series 3: DWI · axial · 6.0mm · 1.77mm/px · z∈[-142,+190]mm · 2 of 47 slices shown (2 of 2)]
[im 1/47]
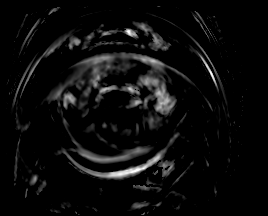
[im 47/47]
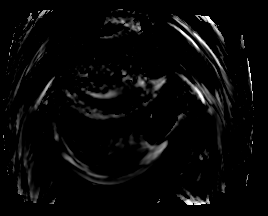

[Series 5: T2 fat-sat · axial · 6.0mm · 1.48mm/px · z∈[-148,+183]mm · 2 of 47 slices shown]
[im 1/47]
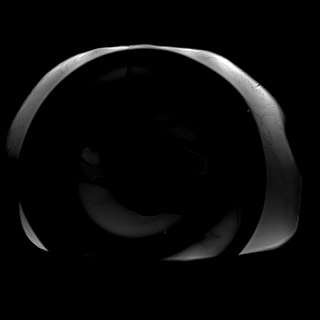
[im 47/47]
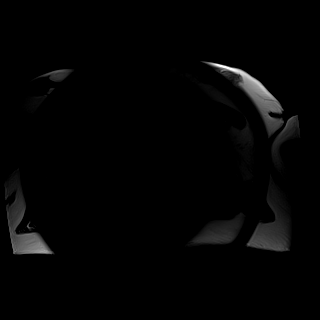

[Series 7: cor haste · coronal · 6.6mm · 0.92mm/px · 1 of 43 slices shown]
[im 1/43]
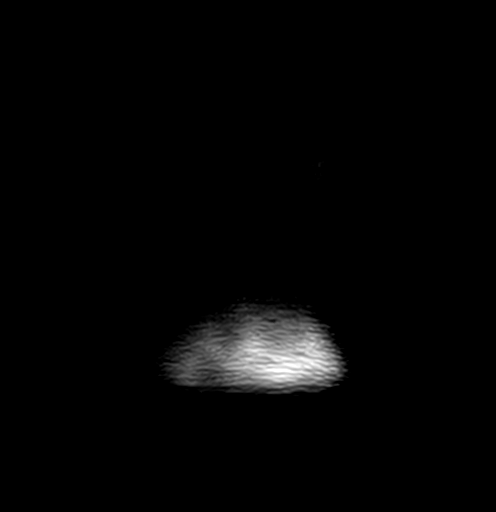

[Series 8: bSSFP · axial · 6.0mm · 2.47mm/px · z∈[-151,+197]mm · 2 of 59 slices shown]
[im 1/59]
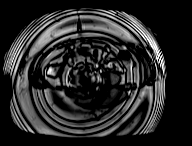
[im 59/59]
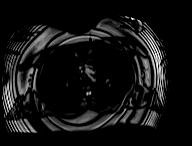

[Series 9: T1 dynamic · axial · 3.6mm · 1.48mm/px · z∈[-148,+194]mm · 3 of 96 slices shown (1 of 6)]
[im 1/96]
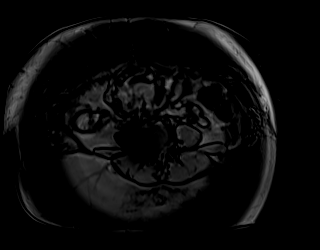
[im 48/96]
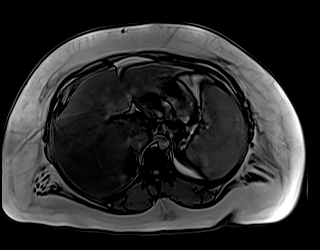
[im 96/96]
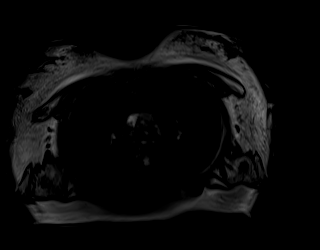

[Series 10: T1 dynamic · axial · 3.6mm · 1.48mm/px · z∈[-148,+194]mm · 3 of 96 slices shown (2 of 6)]
[im 1/96]
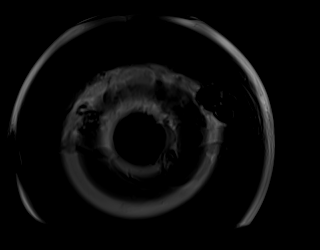
[im 48/96]
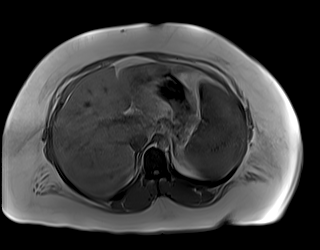
[im 96/96]
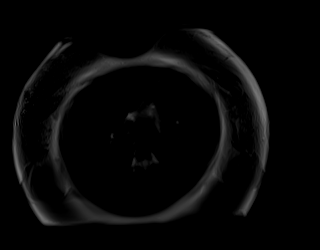

[Series 12: T1 dynamic · axial · 3.6mm · 1.48mm/px · z∈[-148,+194]mm · 3 of 96 slices shown (3 of 6)]
[im 1/96]
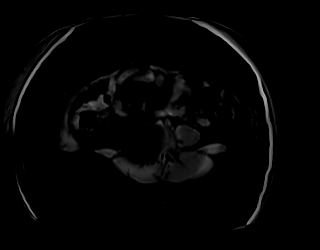
[im 48/96]
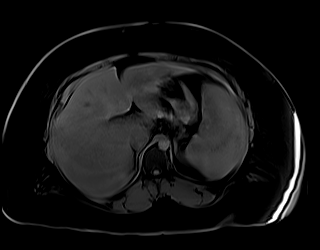
[im 96/96]
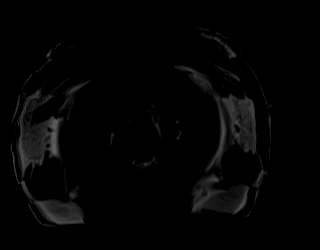

[Series 15: T1 dynamic · axial · 3.6mm · 1.48mm/px · z∈[-148,+194]mm · 3 of 96 slices shown (4 of 6)]
[im 1/96]
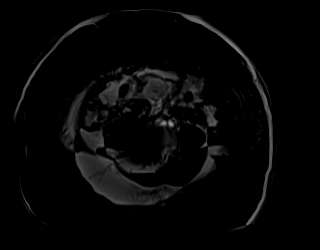
[im 48/96]
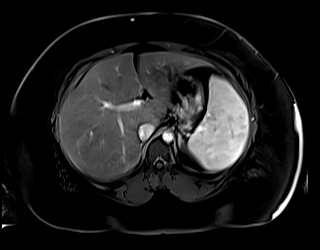
[im 96/96]
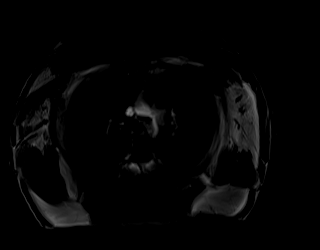

[Series 17: T1 dynamic · axial · 3.6mm · 1.48mm/px · z∈[-148,+194]mm · 3 of 96 slices shown (5 of 6)]
[im 1/96]
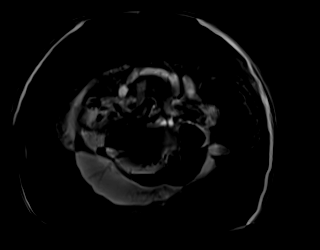
[im 48/96]
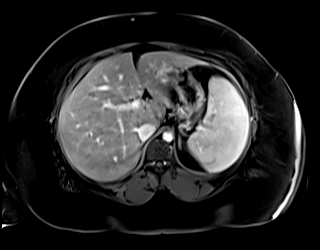
[im 96/96]
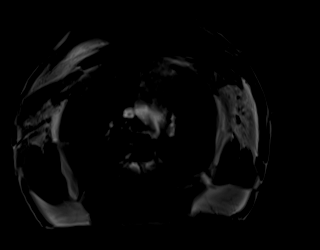

[Series 19: T1 dynamic · axial · 3.6mm · 1.48mm/px · z∈[-148,+194]mm · 3 of 96 slices shown (6 of 6)]
[im 1/96]
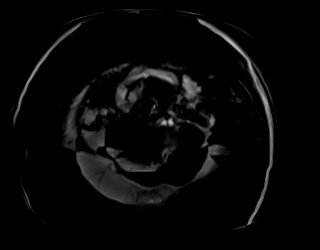
[im 48/96]
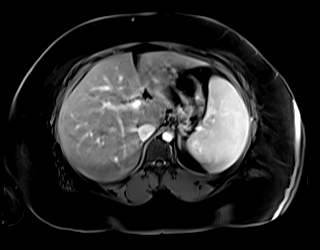
[im 96/96]
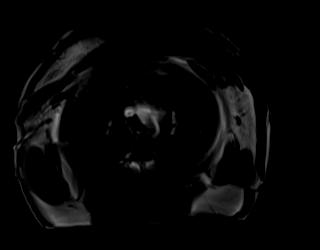

[Series 20: ax_haste_mbh · axial · 6.5mm · 1.48mm/px · 1 of 44 slices shown]
[im 1/44]
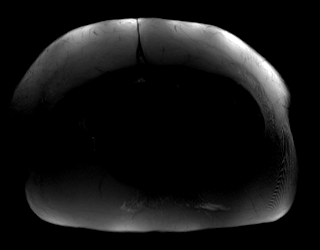

[Series 22: cor_vibe_dixon_delayed_w · coronal · 5.0mm · 2.47mm/px · 3 of 80 slices shown]
[im 1/80]
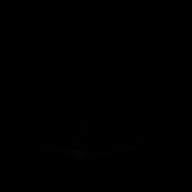
[im 40/80]
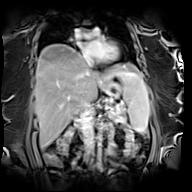
[im 80/80]
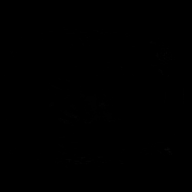

[Series 24: ax_dixon_delayed_w_reg · axial · 3.6mm · 1.48mm/px · z∈[-148,+194]mm · 3 of 96 slices shown]
[im 1/96]
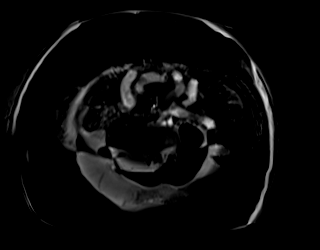
[im 48/96]
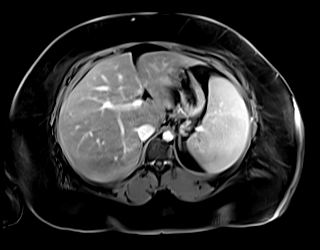
[im 96/96]
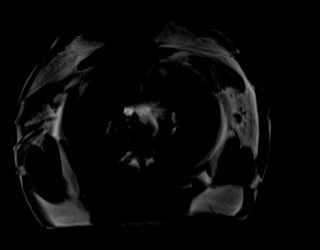

[Series 100: sub_30 · axial · 3.6mm · 1.48mm/px · z∈[-148,+194]mm · 3 of 96 slices shown]
[im 1/96]
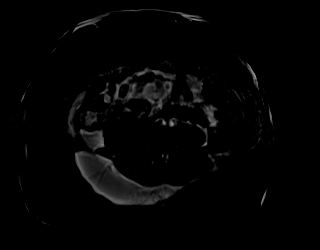
[im 48/96]
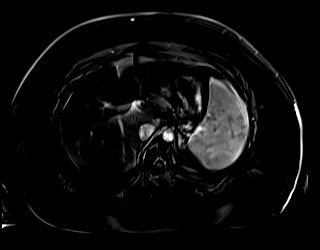
[im 96/96]
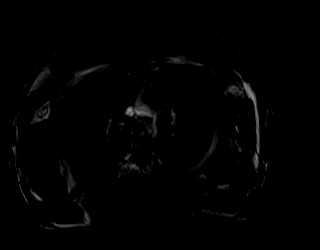

[Series 101: sub_45 · axial · 3.6mm · 1.48mm/px · z∈[-148,+194]mm · 3 of 96 slices shown]
[im 1/96]
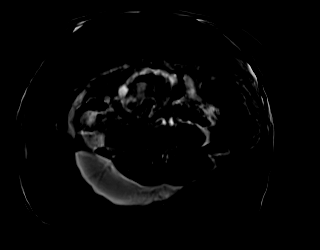
[im 48/96]
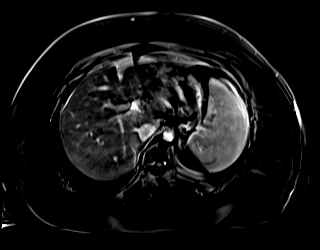
[im 96/96]
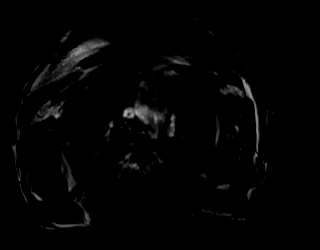

[Series 102: sub_sub 90 · axial · 3.6mm · 1.48mm/px · z∈[-148,+194]mm · 3 of 96 slices shown]
[im 1/96]
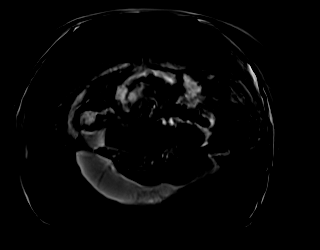
[im 48/96]
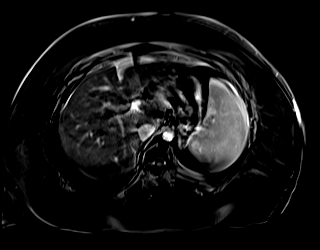
[im 96/96]
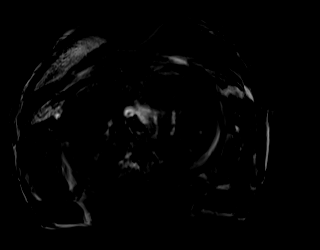

[Series 103: sub_delay · axial · 3.6mm · 1.48mm/px · z∈[-148,+194]mm · 3 of 96 slices shown]
[im 1/96]
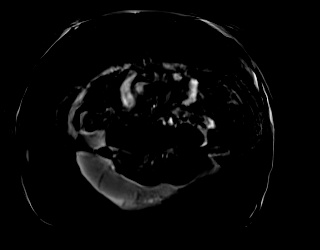
[im 48/96]
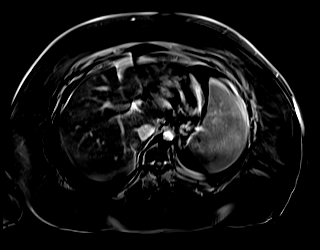
[im 96/96]
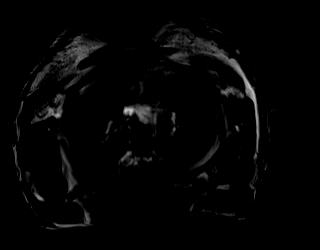

[48 of 48 positions shown; findings below may reference images not displayed]

FINDINGS: Lower chest: No acute findings.

Hepatobiliary: Severe hepatomegaly, maximum coronal span 26.8 cm.
Diffuse hepatic steatosis. Focal fatty deposition in hepatic segment
Roberto, corresponding to previously identified liver lesion, measuring
1.5 x 1.3 cm (series 9, image 45). There is no other associated
signal abnormality or abnormal contrast enhancement in this
vicinity. No mass or other parenchymal abnormality identified. No
gallstones. No biliary ductal dilatation.

Pancreas: No mass, inflammatory changes, or other parenchymal
abnormality identified.No pancreatic ductal dilatation.

Spleen:  Splenomegaly, maximum coronal span 16.3 cm.

Adrenals/Urinary Tract: Normal adrenal glands. No renal masses or
suspicious contrast enhancement identified. No evidence of
hydronephrosis.

Stomach/Bowel: Visualized portions within the abdomen are
unremarkable.

Vascular/Lymphatic: No pathologically enlarged lymph nodes
identified. No abdominal aortic aneurysm demonstrated.

Other:  None.

Musculoskeletal: No suspicious osseous lesions identified.
IMPRESSION: 1. Focal fatty deposition in hepatic segment Roberto, corresponding to
previously identified liver lesion, measuring 1.5 x 1.3 cm. There is
no other associated signal abnormality or abnormal contrast
enhancement in this vicinity. No mass or other parenchymal
abnormality identified.
2. Severe hepatomegaly and background diffuse hepatic steatosis.
3. Splenomegaly.

## 2022-07-04 ENCOUNTER — Encounter (HOSPITAL_COMMUNITY): Payer: Self-pay | Admitting: Emergency Medicine

## 2022-07-04 ENCOUNTER — Other Ambulatory Visit: Payer: Self-pay

## 2022-07-04 ENCOUNTER — Emergency Department (HOSPITAL_COMMUNITY): Payer: Medicaid Other

## 2022-07-04 ENCOUNTER — Emergency Department (HOSPITAL_COMMUNITY)
Admission: EM | Admit: 2022-07-04 | Discharge: 2022-07-04 | Disposition: A | Discharge status: Home / Self Care | Payer: Medicaid Other | Attending: Emergency Medicine | Admitting: Emergency Medicine

## 2022-07-04 DIAGNOSIS — R059 Cough, unspecified: Secondary | ICD-10-CM | POA: Diagnosis present

## 2022-07-04 DIAGNOSIS — J45901 Unspecified asthma with (acute) exacerbation: Secondary | ICD-10-CM | POA: Insufficient documentation

## 2022-07-04 DIAGNOSIS — Z7951 Long term (current) use of inhaled steroids: Secondary | ICD-10-CM | POA: Insufficient documentation

## 2022-07-04 DIAGNOSIS — Z7984 Long term (current) use of oral hypoglycemic drugs: Secondary | ICD-10-CM | POA: Insufficient documentation

## 2022-07-04 DIAGNOSIS — R000 Tachycardia, unspecified: Secondary | ICD-10-CM | POA: Diagnosis not present

## 2022-07-04 DIAGNOSIS — Z8616 Personal history of COVID-19: Secondary | ICD-10-CM | POA: Insufficient documentation

## 2022-07-04 DIAGNOSIS — E119 Type 2 diabetes mellitus without complications: Secondary | ICD-10-CM | POA: Insufficient documentation

## 2022-07-04 DIAGNOSIS — Z8541 Personal history of malignant neoplasm of cervix uteri: Secondary | ICD-10-CM | POA: Insufficient documentation

## 2022-07-04 MED ORDER — ALBUTEROL SULFATE (2.5 MG/3ML) 0.083% IN NEBU: 2.5000 mg | INHALATION_SOLUTION | RESPIRATORY_TRACT | Status: DC | PRN

## 2022-07-04 MED ORDER — SODIUM CHLORIDE 0.9 % IV BOLUS
1000.0000 mL | Freq: Once | INTRAVENOUS | Status: AC
  Administered 2022-07-04: 1000 mL via INTRAVENOUS

## 2022-07-04 MED ORDER — IPRATROPIUM-ALBUTEROL 0.5-2.5 (3) MG/3ML IN SOLN
3.0000 mL | Freq: Once | RESPIRATORY_TRACT | Status: AC
  Administered 2022-07-04: 3 mL via RESPIRATORY_TRACT
  Filled 2022-07-04: qty 3

## 2022-07-04 MED ORDER — ALBUTEROL SULFATE HFA 108 (90 BASE) MCG/ACT IN AERS: 2.0000 | INHALATION_SPRAY | RESPIRATORY_TRACT | Status: DC | PRN

## 2022-07-04 MED ORDER — ALBUTEROL SULFATE HFA 108 (90 BASE) MCG/ACT IN AERS: 1.0000 | INHALATION_SPRAY | Freq: Four times a day (QID) | RESPIRATORY_TRACT | 1 refills | Status: DC | PRN
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
## 2022-07-04 NOTE — Discharge Instructions (Addendum)
Please take your medications as prescribed. Take tylenol/ibuprofen for pain. I recommend close follow-up with PCP for reevaluation.  Please do not hesitate to return to emergency department if worrisome signs symptoms we discussed become apparent.  
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
## 2022-07-04 NOTE — Progress Notes (Signed)
Best peakflow effort was 260. Patient also educated on use of MDI with aerochamber.

## 2022-09-15 ENCOUNTER — Encounter (HOSPITAL_COMMUNITY): Payer: Self-pay

## 2022-09-15 ENCOUNTER — Ambulatory Visit (HOSPITAL_COMMUNITY)
Admission: RE | Admit: 2022-09-15 | Discharge: 2022-09-15 | Disposition: A | Discharge status: Home / Self Care | Payer: Medicaid Other | Source: Ambulatory Visit | Attending: Gynecologic Oncology | Admitting: Gynecologic Oncology

## 2022-09-15 DIAGNOSIS — C539 Malignant neoplasm of cervix uteri, unspecified: Secondary | ICD-10-CM | POA: Diagnosis present

## 2022-09-15 LAB — POCT I-STAT CREATININE: Creatinine, Ser: 0.7 mg/dL (ref 0.44–1.00)

## 2022-09-15 MED ORDER — IOHEXOL 300 MG/ML  SOLN
100.0000 mL | Freq: Once | INTRAMUSCULAR | Status: AC | PRN
  Administered 2022-09-15: 100 mL via INTRAVENOUS

## 2022-09-15 MED ORDER — IOHEXOL 9 MG/ML PO SOLN
1000.0000 mL | ORAL | Status: AC
  Administered 2022-09-15: 1000 mL via ORAL

## 2022-09-15 MED ORDER — IOHEXOL 9 MG/ML PO SOLN
ORAL | Status: AC
  Filled 2022-09-15: qty 1000

## 2022-09-15 MED ORDER — SODIUM CHLORIDE (PF) 0.9 % IJ SOLN
INTRAMUSCULAR | Status: AC
  Filled 2022-09-15: qty 50

## 2022-09-19 ENCOUNTER — Encounter: Payer: Self-pay | Admitting: Gynecologic Oncology

## 2022-09-19 ENCOUNTER — Inpatient Hospital Stay: Payer: Medicaid Other | Attending: Gynecologic Oncology | Admitting: Gynecologic Oncology

## 2022-09-19 VITALS — BP 130/82 | HR 108 | Temp 98.3°F | Resp 20 | Wt 300.2 lb

## 2022-09-19 DIAGNOSIS — Z923 Personal history of irradiation: Secondary | ICD-10-CM | POA: Insufficient documentation

## 2022-09-19 DIAGNOSIS — Z8541 Personal history of malignant neoplasm of cervix uteri: Secondary | ICD-10-CM | POA: Insufficient documentation

## 2022-09-19 DIAGNOSIS — Z9221 Personal history of antineoplastic chemotherapy: Secondary | ICD-10-CM | POA: Diagnosis not present

## 2022-09-19 DIAGNOSIS — C539 Malignant neoplasm of cervix uteri, unspecified: Secondary | ICD-10-CM

## 2022-09-19 NOTE — Patient Instructions (Signed)
It was good to see you today.  I do not see or feel any evidence of recurrence on your exam.  Will plan to get another CT scan in 3 months because of the small area that we discussed today.    In terms of your right side pain, please try using ibuprofen as needed, heat/ice, IcyHot, and doing some stretching and massage.  Please let me know in the next few weeks if this does not get better.  If you develop any concerning symptoms such as vaginal bleeding or abdominal pain between now and your next visit with me, please call to see me sooner.

## 2022-09-19 NOTE — Progress Notes (Signed)
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 Large indeterminate right adnexal mass with solid hypervascular and cystic components. This appears separate from the base of the appendix on preceding CT and is likely arising from the right ovary based on relationship to the gonadal vessels on CT. Given the patient's young age, hypervascularity and leukocytosis, findings could represent an atypical tubo-ovarian abscess. However, the appearance is concerning for ovarian malignancy despite the patient's young age. Recommend prompt gynecology consultation. Pelvic MRI without and with contrast may be helpful for further evaluation. 2. The left ovary appears normal. Mild nonspecific thickening of the endometrium, within physiologic limits.   01/02/2021 Pathology Results   SPECIMEN ADEQUACY: Satisfactory for evaluation; transformation zone component PRESENT. INTERPRETATION: - Adenocarcinoma, NOS   01/15/2021 Imaging   MRI pelvis  13.0 cm cystic and solid mass arising from the right ovary, highly suspicious for ovarian carcinoma.   Mild endometrial thickening and 4.5 cm soft tissue mass within the endocervical canal. Differential diagnosis includes prolapsing endometrial polyp or carcinoma, or primary cervical carcinoma.   No evidence of pelvic metastatic disease.   01/16/2021 Initial Diagnosis   Malignant neoplasm of cervix (HCC)   01/22/2021 PET scan   1. Large cystic/necrotic right adnexal mass is markedly hypermetabolic and consistent with known neoplasm. 2. Extensive hypermetabolic tumor involving the endometrium and cervix. 3. Scattered borderline enlarged and mildly hypermetabolic retroperitoneal and external  iliac nodes. 4. No findings for metastatic disease involving the chest or bony structures.   01/24/2021 Surgery   Pre-operative Diagnosis: Complex adnexal mass, elevated CA-125, locally invasive cervical adenocarcinoma versus endometrial adenocarcinoma   Post-operative Diagnosis: same, high-grade carcinoma of the right ovary, suspected history of superinfection of the adnexal mass   Operation: Robotic-assisted laparoscopic bilateral salpingectomy, right oophorectomy, lysis of adhesions of approximately 45 minutes, right ureterolysis   Surgeon: Eugene Garnet MD    Operative Findings: On EUA, cervix enlarged and firm, fleshy tumor replacing the endocervix.  No definitive parametrial involvement appreciated.  Even gentle exam because significant bleeding so vagina was packed with lap sponges for the remainder of surgery.  On intra-abdominal exam, normal upper abdominal survey including omentum, liver edge, diaphragm, and stomach.  Normal small bowel.  Some physiologic adhesions of the sigmoid epiploica to the left sidewall.  Normal-appearing left ovary measuring approximately 3-4 cm.  Normal-appearing bilateral fallopian tubes.  Uterus approximately 8-10 cm and overall normal in appearance.  Right ovary replaced by a smooth but vascular 10 cm mass with multiple loculations.  Ovary with dense adhesions to the right pelvic sidewall.  Upon very careful manipulation of the mass for its removal, there was some drainage of fluid noted that was somewhat purulent.  This was sent for culture and Gram stain.  Upon contained mass removal with fractionation of the mass, tumor versus necrotic material was noted.  Frozen section consistent with high-grade carcinoma, unable to differentiate on frozen section whether this is the same cancer being seen on her cervical biopsies.  Right ureterolysis performed given oozing services from the broad ligament and peritoneum where adhesions were lysed between the ovary and  peritoneum, to assure ureter not in close proximity.  No obvious adenopathy.  No ascites.  Left ovary transposed out of the  pelvis along the left gutter, clips placed along the sidewall as well as the superior and inferior aspect of the ovary.     01/24/2021 Pathology Results   FINAL MICROSCOPIC DIAGNOSIS:   A. OVARY AND FALLOPIAN TUBE, RIGHT, SALPINGO OOPHORECTOMY:  - Endometrioid carcinoma, moderately differentiated, involving right ovary  - No evidence of ovarian surface involvement by carcinoma  - Segment of fallopian tube, negative for carcinoma  - See oncology table  - See comment   B. FALLOPIAN TUBE, LEFT, SALPINGECTOMY:  - Segment of fallopian tube, negative for carcinoma   A.  Immunostain for p16 shows patchy staining.  Immunostain for p53 shows a normal, wild-type expression pattern.  This immunoprofile is consistent with above interpretation.  Patient's previous cervical  biopsy was reviewed - it shows moderately differentiated carcinoma with a very similar histomorphology and very likely presents metastasis from the ovarian tumor to the cervix.    02/05/2021 Cancer Staging   Staging form: Cervix Uteri, AJCC Version 9 - Pathologic stage from 02/05/2021: FIGO Stage II (pT2, pN0, cM0) - Signed by Artis Delay, MD on 02/05/2021 Stage prefix: Initial diagnosis   02/14/2021 Procedure   Successful placement of a right internal jugular approach power injectable Port-A-Cath. The catheter is ready for immediate use   02/22/2021 - 03/22/2021 Chemotherapy   Patient is on Treatment Plan : Cervical cancer Cisplatin q7d     02/25/2021 - 03/29/2021 Radiation Therapy   Radiation Treatment Dates: 02/25/2021 through 04/09/2021 Pelvic IMRT: 02/25/21 through 03/29/21 HDR-brachy: 04/09/21 Site Technique Total Dose (Gy) Dose per Fx (Gy) Completed Fx Beam Energies  Pelvis: Pelvis IMRT 45/45 1.8 25/25 6X  Cervix: Cervix_Bst_Fx1 HDR-brachy 5.5/5.5 5.5 1/1 Ir-192         05/13/2021 Imaging   1. Status post  bilateral oophorectomy. Previously seen large hypermetabolic right adnexal mass has been resected. Nodular soft tissue in the vicinity of both the left and right oophorectomy bed, likely postoperative in nature. Attention on follow-up. 2. Previously noted uterine/cervical mass is resolved, consistent with treatment response. 3. Prominent, previously FDG avid iliac and retroperitoneal lymph nodes are slightly diminished in size, consistent with treatment response. 4. Hypodense liver lesion of hepatic segment IVa measuring 1.5 x 1.2 cm, not previously appreciated by contrast enhanced CT or PET-CT, incompletely characterized although worrisome for hepatic metastasis. This could be further characterized by multiphasic contrast enhanced MRI. 5. Hepatomegaly and hepatic steatosis. 6. Splenomegaly.     05/21/2021 Surgery   Robotic-assisted modified radical hysterectomy with left oophorectomy, cystoscopy  Findings:  On EUA, some radiation changes noted with deviation of the cervix posteriorly. On intra-abdominal entry, normal upper abdominal survey. Normal omentum, small and large bowel. Surgically absent right adnexa. Left ovary pexed along sidewall above the pelvic brim (normal in appearance). Uterus 8-10 cm and somewhat bulbous. Significant retroperitoneal edema, some fibrosis. 2-3 cm tumor implant along the right parametrium and distal uterosacral ligament, free from the cervix itself. No obvious paracervical tumor extension. No gross adenopathy. No ascites.  On cystoscopy, bladder intact, good efflux noted from bilateral ureteral orifices.   05/21/2021 Pathology Results   A. UTERUS AND CERVIX, HYSTERECTOMY:  - Microscopic fragment in the upper endocervical canal suspicious for  treated adenocarcinoma associated with extensive treatment response.  - Fibrosis and cytologic atypia in the endometrium consistent with  treatment effects.  - See comment.   B. PERIMETRIUM, RIGHT, EXCISION  - Amorphous  material associated with histiocytic response.  - Connective tissue with inflammation, fibrosis and dystrophic  calcifications.  -  No malignancy identified.   C. OVARY, LEFT, OOPHORECTOMY:  - Benign ovary.  - No malignancy identified.   COMMENT:  There is a 1.7 cm polypoid mass involving the anterior endocervical canal which histologically shows dense fibrosis with scattered glands with cytologic atypia consistent with therapy effects.  This area has a microscopic, partially detached fragment with marked atypia and architectural changes suspicious for microscopic residual adenocarcinoma.  Elsewhere, the endometrium shows foci of similar fibrosis with patchy cytologic atypia consistent with therapy effects.  There are foci where fibrosis extends into the adjacent upper portions of the myometrium.  There is insufficient residual suspicious tissue present to resolve the location of the primary in this case.  The pattern of fibrotic apparent therapy affects is more suggestive of an ovarian metastasis from an endocervical or endometrial primary.    07/16/2021 PET scan   1. Interval hysterectomy with mild residual uptake in the surgical bed. This examination will serve as baseline for future comparison. No evidence of distant metastatic disease. 2. Relatively patchy/mottled uptake throughout the spine may be treatment related. No definite focal lesion. 3. Severely enlarged steatotic liver. 4. Splenomegaly.   10/28/2021 Procedure   Successful removal of Port-A-Cath without immediate post procedural complication.     Interval History: Overall doing well.  Notes a couple of weeks of right-sided pain that she notices when she lays on that side.  Denies any pain when she is upright or moving.  Has used IcyHot with some relief.  Does not remember straining something.  Denies any associated symptoms.  She denies any vaginal bleeding or discharge.  Reports normal bowel and bladder function.  Denies any  bleeding or discharge.  Past Medical/Surgical History: Past Medical History:  Diagnosis Date   Anxiety    Asthma    Depression    History of chemotherapy    02-22-2021  to 03-22-2021  for cervical cancer   History of COVID-19 02/2020   followed by pcp   (04-03-2021  per pt checks blood sugar once weekly)   History of external beam radiation therapy    02-25-2021  to 03-29-2021 for cervical cancer   History of radiation therapy    cervix - tandem and ring 04/09/2021  Dr Antony Blackbird   Iron deficiency anemia due to chronic blood loss    Malignant neoplasm cervix [facility])    oncologist--- dr gorsuch// radiation onologist-- dr Roselind Messier;   dx 01-16-2021;   01-24-2021  s/p robotic RSO and left salpingectomy for right ovarian mass (endometrioid carcinoma);  completed chemo 03-22-2021 and IMRT 03-29-2021 (scheduled for high dose brachytherapy 04-09-2021)   Migraines    migraine   Morbid obesity (HCC)    Type 2 diabetes mellitus (HCC)     Past Surgical History:  Procedure Laterality Date   IR IMAGING GUIDED PORT INSERTION  02/13/2021   IR REMOVAL TUN ACCESS W/ PORT W/O FL MOD SED  10/28/2021   OPERATIVE ULTRASOUND N/A 04/09/2021   Procedure: OPERATIVE ULTRASOUND;  Surgeon: Antony Blackbird, MD;  Location: [facility];  Service: Urology;  Laterality: N/A;   ROBOTIC ASSISTED TOTAL HYSTERECTOMY N/A 05/21/2021   Procedure: XI ROBOTIC ASSISTED TOTAL HYSTERECTOMY;  Surgeon: Carver Fila, MD;  Location: [facility];  Service: Gynecology;  Laterality: N/A;   TANDEM RING INSERTION N/A 04/09/2021   Procedure: TANDEM RING INSERTION;  Surgeon: Antony Blackbird, MD;  Location: [facility];  Service: Urology;  Laterality: N/A;   TONSILLECTOMY     child   XI  ROBOTIC ASSISTED OOPHORECTOMY N/A 01/24/2021   Procedure: XI ROBOTIC ASSISTED RIGHT OOPHORECTOMY, OOPHOROPEXY OF LEFT OVARY;  Surgeon: Carver Fila, MD;  Location: [facility];  Service: Gynecology;  Laterality: N/A;   XI ROBOTIC  ASSISTED OOPHORECTOMY Left 05/21/2021   Procedure: XI ROBOTIC ASSISTED LEFT OOPHORECTOMY;  Surgeon: Carver Fila, MD;  Location: [facility];  Service: Gynecology;  Laterality: Left;   XI ROBOTIC ASSISTED SALPINGECTOMY Bilateral 01/24/2021   Procedure: XI ROBOTIC ASSISTED SALPINGECTOMY; RIGHT URETERAL LYSIS; LYSIS OF ADHESIONS;  Surgeon: Carver Fila, MD;  Location: [facility];  Service: Gynecology;  Laterality: Bilateral;    Family History  Problem Relation Age of Onset   Asthma Mother    Diabetes Mother    Hypertension Mother    Breast cancer Mother    Asthma Father    Diabetes Father    Kidney disease Father    Diabetes Sister    Asthma Sister    Asthma Brother    Diabetes Brother    Cancer Maternal Aunt    Diabetes Maternal Uncle    Diabetes Maternal Grandmother    Hypertension Maternal Grandmother    Anemia Maternal Grandmother    Heart attack Paternal Grandfather    Cancer Paternal Grandfather    Colon cancer Neg Hx    Ovarian cancer Neg Hx    Endometrial cancer Neg Hx    Pancreatic cancer Neg Hx    Prostate cancer Neg Hx     Social History   Socioeconomic History   Marital status: Single    Spouse name: Not on file   Number of children: Not on file   Years of education: Not on file   Highest education level: Not on file  Occupational History   Not on file  Tobacco Use   Smoking status: Never   Smokeless tobacco: Never  Vaping Use   Vaping status: Never Used  Substance and Sexual Activity   Alcohol use: Never   Drug use: Never   Sexual activity: Not Currently    Birth control/protection: None    Comment: female partner  Other Topics Concern   Not on file  Social History Narrative   Not on file   Social Determinants of Health   Financial Resource Strain: Not on file  Food Insecurity: Not on file  Transportation Needs: Not on file  Physical Activity: Not on file  Stress: Not on file  Social Connections: Not on file    Current  Medications:  Current Outpatient Medications:    albuterol (VENTOLIN HFA) 108 (90 Base) MCG/ACT inhaler, Inhale 2 puffs into the lungs every 6 (six) hours as needed for wheezing or shortness of breath., Disp: 8 g, Rfl: 0   albuterol (VENTOLIN HFA) 108 (90 Base) MCG/ACT inhaler, Inhale 1-2 puffs into the lungs every 6 (six) hours as needed for wheezing or shortness of breath., Disp: 8 g, Rfl: 1   Ascorbic Acid (VITAMIN C) 500 MG CHEW, 1 capsule in the evening with iron tablets Orally Once a day for 90 days, Disp: , Rfl:    ferrous sulfate 325 (65 FE) MG tablet, Take 325 mg by mouth once a week., Disp: , Rfl:    ibuprofen (ADVIL) 200 MG tablet, Take 200 mg by mouth 2 (two) times daily as needed for headache (pain)., Disp: , Rfl:    metFORMIN (GLUCOPHAGE) 500 MG tablet, Take 1 tablet (500 mg total) by mouth 2 (two) times daily with a meal., Disp: 60 tablet, Rfl: 1   propranolol (  INDERAL) 40 MG tablet, Take 40 mg by mouth daily as needed (migraines)., Disp: , Rfl:    sertraline (ZOLOFT) 50 MG tablet, Take 1 tablet (50 mg total) by mouth daily., Disp: 30 tablet, Rfl: 6   estradiol (ESTRACE) 1 MG tablet, Take 1 tablet (1 mg total) by mouth daily. (Patient not taking: Reported on 09/16/2022), Disp: 60 tablet, Rfl: 6  Review of Systems: + weight change, headache Denies appetite changes, fevers, chills, fatigue. Denies hearing loss, neck lumps or masses, mouth sores, ringing in ears or voice changes. Denies cough or wheezing.  Denies shortness of breath. Denies chest pain or palpitations. Denies leg swelling. Denies abdominal distention, pain, blood in stools, constipation, diarrhea, nausea, vomiting, or early satiety. Denies pain with intercourse, dysuria, frequency, hematuria or incontinence. Denies hot flashes, pelvic pain, vaginal bleeding or vaginal discharge.   Denies joint pain, back pain or muscle pain/cramps. Denies itching, rash, or wounds. Denies dizziness, numbness or seizures. Denies  swollen lymph nodes or glands, denies easy bruising or bleeding. Denies anxiety, depression, confusion, or decreased concentration.  Physical Exam: BP 130/82   Pulse (!) 108   Temp 98.3 F (36.8 C)   Resp 20   Wt (!) 300 lb 3.2 oz (136.2 kg)   LMP 03/25/2021 (Approximate)   SpO2 98%   BMI 48.45 kg/m  General: Alert, oriented, no acute distress. HEENT: Normocephalic, atraumatic, sclera anicteric. Chest: Clear to auscultation bilaterally.  No wheezes or rhonchi. Cardiovascular: Heart rate in 90s, regular rhythm, no murmurs. Abdomen: Obese, soft, nontender.  Normoactive bowel sounds.  No masses or hepatosplenomegaly appreciated.  Well-healed incisions. Extremities: Grossly normal range of motion.  Warm, well perfused.  No edema bilaterally. Skin: No rashes or lesions noted. GU: Normal appearing external genitalia without erythema, excoriation, or lesions.  Speculum exam reveals somewhat atrophic vaginal mucosa with radiation changes present.  Cuff intact, no lesions visible.  Bimanual exam reveals cuff intact, no masses or nodularity.  This is confirmed on rectovaginal exam.   Laboratory & Radiologic Studies: Pap 05/2022: NIMl, HR HPV negative  Assessment & Plan: Alyssa Becker is a 27 y.o. 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  PDL1 10%.   Patient is overall doing well and is NED on exam.    We reviewed most recent imaging.  Given new small nodule in the mesentery, we will plan on close follow-up imaging in 3 months.  Discussed her right-sided pain, which seems to be muscular in nature.  Encouraged using NSAIDs, ice/heat therapy.  I have asked her to call with an update in the next several weeks.   She continues on estrogen replacement, overall doing well.  Having some hot flashes, symptoms fairly  well-controlled.  Notes that her mood has overall been better although today she is very worried about her mother who is having a kidney issue and scheduled to undergo surgery at a hospital in [place].    After her last visit, she will did not reach out to have a conversation with Marcelino Duster.  She continues to decline referral to therapist.  We discussed cancer support group and I gave her the information to call or email about future cancer support meetings.  Patient voiced interest in considering bariatric surgery again.  She was given information for the bariatric surgery clinic.    Per NCCN surveillance recommendations, we will continue with visits every  3 months.  These will include a pelvic exam.  Pap test and HPV testing will performed yearly.  25 minutes of total time was spent for this patient encounter, including preparation, face-to-face counseling with the patient and coordination of care, and documentation of the encounter.  Eugene Garnet, MD  Division of Gynecologic Oncology  Department of Obstetrics and Gynecology  [facility]

## 2022-11-24 ENCOUNTER — Ambulatory Visit (HOSPITAL_COMMUNITY)
Admission: RE | Admit: 2022-11-24 | Discharge: 2022-11-24 | Disposition: A | Discharge status: Home / Self Care | Payer: Medicaid Other | Source: Ambulatory Visit | Attending: Gynecologic Oncology | Admitting: Gynecologic Oncology

## 2022-11-24 DIAGNOSIS — C539 Malignant neoplasm of cervix uteri, unspecified: Secondary | ICD-10-CM | POA: Diagnosis present

## 2022-11-24 LAB — POCT I-STAT CREATININE: Creatinine, Ser: 0.7 mg/dL (ref 0.44–1.00)

## 2022-11-24 MED ORDER — IOHEXOL 9 MG/ML PO SOLN
1000.0000 mL | Freq: Once | ORAL | Status: AC
  Administered 2022-11-24: 1000 mL via ORAL

## 2022-11-24 MED ORDER — IOHEXOL 12 MG/ML PO SOLN
1000.0000 mL | Freq: Once | ORAL | Status: DC
Start: 2022-11-24 — End: 2022-11-24

## 2022-11-24 MED ORDER — IOHEXOL 300 MG/ML  SOLN
100.0000 mL | Freq: Once | INTRAMUSCULAR | Status: AC | PRN
  Administered 2022-11-24: 100 mL via INTRAVENOUS

## 2022-12-12 ENCOUNTER — Encounter: Payer: Self-pay | Admitting: Gynecologic Oncology

## 2022-12-12 ENCOUNTER — Inpatient Hospital Stay: Payer: Medicaid Other | Attending: Gynecologic Oncology | Admitting: Gynecologic Oncology

## 2022-12-12 VITALS — BP 130/83 | HR 96 | Temp 98.5°F | Resp 18 | Ht 66.0 in | Wt 301.0 lb

## 2022-12-12 DIAGNOSIS — Z90710 Acquired absence of both cervix and uterus: Secondary | ICD-10-CM | POA: Insufficient documentation

## 2022-12-12 DIAGNOSIS — Z90721 Acquired absence of ovaries, unilateral: Secondary | ICD-10-CM | POA: Insufficient documentation

## 2022-12-12 DIAGNOSIS — N951 Menopausal and female climacteric states: Secondary | ICD-10-CM | POA: Diagnosis not present

## 2022-12-12 DIAGNOSIS — Z9221 Personal history of antineoplastic chemotherapy: Secondary | ICD-10-CM | POA: Insufficient documentation

## 2022-12-12 DIAGNOSIS — Z923 Personal history of irradiation: Secondary | ICD-10-CM | POA: Insufficient documentation

## 2022-12-12 DIAGNOSIS — E8940 Asymptomatic postprocedural ovarian failure: Secondary | ICD-10-CM | POA: Diagnosis not present

## 2022-12-12 DIAGNOSIS — F32A Depression, unspecified: Secondary | ICD-10-CM | POA: Diagnosis not present

## 2022-12-12 DIAGNOSIS — E8941 Symptomatic postprocedural ovarian failure: Secondary | ICD-10-CM | POA: Insufficient documentation

## 2022-12-12 DIAGNOSIS — Z9079 Acquired absence of other genital organ(s): Secondary | ICD-10-CM | POA: Diagnosis not present

## 2022-12-12 DIAGNOSIS — Z8541 Personal history of malignant neoplasm of cervix uteri: Secondary | ICD-10-CM

## 2022-12-12 DIAGNOSIS — C539 Malignant neoplasm of cervix uteri, unspecified: Secondary | ICD-10-CM

## 2022-12-12 MED ORDER — SERTRALINE HCL 50 MG PO TABS: 50.0000 mg | ORAL_TABLET | Freq: Every day | ORAL | 6 refills | Status: AC

## 2022-12-12 NOTE — Progress Notes (Signed)
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 Large indeterminate right adnexal mass with solid hypervascular and cystic components. This appears separate from the base of the appendix on preceding CT and is likely arising from the right ovary based on relationship to the gonadal vessels on CT. Given the patient's young age, hypervascularity and leukocytosis, findings could represent an atypical tubo-ovarian abscess. However, the appearance is concerning for ovarian malignancy despite the patient's young age. Recommend prompt gynecology consultation. Pelvic MRI without and with contrast may be helpful for further evaluation. 2. The left ovary appears normal. Mild nonspecific thickening of the endometrium, within physiologic limits.   01/02/2021 Pathology Results   SPECIMEN ADEQUACY: Satisfactory for evaluation; transformation zone component PRESENT. INTERPRETATION: - Adenocarcinoma, NOS   01/15/2021 Imaging   MRI pelvis  13.0 cm cystic and solid mass arising from the right ovary, highly suspicious for ovarian carcinoma.   Mild endometrial thickening and 4.5 cm soft tissue mass within the endocervical canal. Differential diagnosis includes prolapsing endometrial polyp or carcinoma, or primary cervical carcinoma.   No evidence of pelvic metastatic disease.   01/16/2021 Initial Diagnosis   Malignant neoplasm of cervix (HCC)   01/22/2021 PET scan   1. Large cystic/necrotic right adnexal mass is markedly hypermetabolic and consistent with known neoplasm. 2. Extensive hypermetabolic tumor involving the endometrium and cervix. 3. Scattered borderline enlarged and mildly hypermetabolic retroperitoneal and  external iliac nodes. 4. No findings for metastatic disease involving the chest or bony structures.   01/24/2021 Surgery   Pre-operative Diagnosis: Complex adnexal mass, elevated CA-125, locally invasive cervical adenocarcinoma versus endometrial adenocarcinoma   Post-operative Diagnosis: same, high-grade carcinoma of the right ovary, suspected history of superinfection of the adnexal mass   Operation: Robotic-assisted laparoscopic bilateral salpingectomy, right oophorectomy, lysis of adhesions of approximately 45 minutes, right ureterolysis   Surgeon: Eugene Garnet MD    Operative Findings: On EUA, cervix enlarged and firm, fleshy tumor replacing the endocervix.  No definitive parametrial involvement appreciated.  Even gentle exam because significant bleeding so vagina was packed with lap sponges for the remainder of surgery.  On intra-abdominal exam, normal upper abdominal survey including omentum, liver edge, diaphragm, and stomach.  Normal small bowel.  Some physiologic adhesions of the sigmoid epiploica to the left sidewall.  Normal-appearing left ovary measuring approximately 3-4 cm.  Normal-appearing bilateral fallopian tubes.  Uterus approximately 8-10 cm and overall normal in appearance.  Right ovary replaced by a smooth but vascular 10 cm mass with multiple loculations.  Ovary with dense adhesions to the right pelvic sidewall.  Upon very careful manipulation of the mass for its removal, there was some drainage of fluid noted that was somewhat purulent.  This was sent for culture and Gram stain.  Upon contained mass removal with fractionation of the mass, tumor versus necrotic material was noted.  Frozen section consistent with high-grade carcinoma, unable to differentiate on frozen section whether this is the same cancer being seen on her cervical biopsies.  Right ureterolysis performed given oozing services from the broad ligament and peritoneum where adhesions were lysed between the ovary  and peritoneum, to assure ureter not in close proximity.  No obvious adenopathy.  No ascites.  Left ovary transposed out of  the pelvis along the left gutter, clips placed along the sidewall as well as the superior and inferior aspect of the ovary.     01/24/2021 Pathology Results   FINAL MICROSCOPIC DIAGNOSIS:   A. OVARY AND FALLOPIAN TUBE, RIGHT, SALPINGO OOPHORECTOMY:  - Endometrioid carcinoma, moderately differentiated, involving right ovary  - No evidence of ovarian surface involvement by carcinoma  - Segment of fallopian tube, negative for carcinoma  - See oncology table  - See comment   B. FALLOPIAN TUBE, LEFT, SALPINGECTOMY:  - Segment of fallopian tube, negative for carcinoma   A.  Immunostain for p16 shows patchy staining.  Immunostain for p53 shows a normal, wild-type expression pattern.  This immunoprofile is consistent with above interpretation.  Patient's previous cervical  biopsy was reviewed - it shows moderately differentiated carcinoma with a very similar histomorphology and very likely presents metastasis from the ovarian tumor to the cervix.    02/05/2021 Cancer Staging   Staging form: Cervix Uteri, AJCC Version 9 - Pathologic stage from 02/05/2021: FIGO Stage II (pT2, pN0, cM0) - Signed by Artis Delay, MD on 02/05/2021 Stage prefix: Initial diagnosis   02/14/2021 Procedure   Successful placement of a right internal jugular approach power injectable Port-A-Cath. The catheter is ready for immediate use   02/22/2021 - 03/22/2021 Chemotherapy   Patient is on Treatment Plan : Cervical cancer Cisplatin q7d     02/25/2021 - 03/29/2021 Radiation Therapy   Radiation Treatment Dates: 02/25/2021 through 04/09/2021 Pelvic IMRT: 02/25/21 through 03/29/21 HDR-brachy: 04/09/21 Site Technique Total Dose (Gy) Dose per Fx (Gy) Completed Fx Beam Energies  Pelvis: Pelvis IMRT 45/45 1.8 25/25 6X  Cervix: Cervix_Bst_Fx1 HDR-brachy 5.5/5.5 5.5 1/1 Ir-192         05/13/2021 Imaging   1. Status  post bilateral oophorectomy. Previously seen large hypermetabolic right adnexal mass has been resected. Nodular soft tissue in the vicinity of both the left and right oophorectomy bed, likely postoperative in nature. Attention on follow-up. 2. Previously noted uterine/cervical mass is resolved, consistent with treatment response. 3. Prominent, previously FDG avid iliac and retroperitoneal lymph nodes are slightly diminished in size, consistent with treatment response. 4. Hypodense liver lesion of hepatic segment IVa measuring 1.5 x 1.2 cm, not previously appreciated by contrast enhanced CT or PET-CT, incompletely characterized although worrisome for hepatic metastasis. This could be further characterized by multiphasic contrast enhanced MRI. 5. Hepatomegaly and hepatic steatosis. 6. Splenomegaly.     05/21/2021 Surgery   Robotic-assisted modified radical hysterectomy with left oophorectomy, cystoscopy  Findings:  On EUA, some radiation changes noted with deviation of the cervix posteriorly. On intra-abdominal entry, normal upper abdominal survey. Normal omentum, small and large bowel. Surgically absent right adnexa. Left ovary pexed along sidewall above the pelvic brim (normal in appearance). Uterus 8-10 cm and somewhat bulbous. Significant retroperitoneal edema, some fibrosis. 2-3 cm tumor implant along the right parametrium and distal uterosacral ligament, free from the cervix itself. No obvious paracervical tumor extension. No gross adenopathy. No ascites.  On cystoscopy, bladder intact, good efflux noted from bilateral ureteral orifices.   05/21/2021 Pathology Results   A. UTERUS AND CERVIX, HYSTERECTOMY:  - Microscopic fragment in the upper endocervical canal suspicious for  treated adenocarcinoma associated with extensive treatment response.  - Fibrosis and cytologic atypia in the endometrium consistent with  treatment effects.  - See comment.   B. PERIMETRIUM, RIGHT, EXCISION  - Amorphous  material associated with histiocytic response.  - Connective tissue with inflammation, fibrosis and dystrophic  calcifications.  -  No malignancy identified.   C. OVARY, LEFT, OOPHORECTOMY:  - Benign ovary.  - No malignancy identified.   COMMENT:  There is a 1.7 cm polypoid mass involving the anterior endocervical canal which histologically shows dense fibrosis with scattered glands with cytologic atypia consistent with therapy effects.  This area has a microscopic, partially detached fragment with marked atypia and architectural changes suspicious for microscopic residual adenocarcinoma.  Elsewhere, the endometrium shows foci of similar fibrosis with patchy cytologic atypia consistent with therapy effects.  There are foci where fibrosis extends into the adjacent upper portions of the myometrium.  There is insufficient residual suspicious tissue present to resolve the location of the primary in this case.  The pattern of fibrotic apparent therapy affects is more suggestive of an ovarian metastasis from an endocervical or endometrial primary.    07/16/2021 PET scan   1. Interval hysterectomy with mild residual uptake in the surgical bed. This examination will serve as baseline for future comparison. No evidence of distant metastatic disease. 2. Relatively patchy/mottled uptake throughout the spine may be treatment related. No definite focal lesion. 3. Severely enlarged steatotic liver. 4. Splenomegaly.   10/28/2021 Procedure   Successful removal of Port-A-Cath without immediate post procedural complication.     Interval History: Doing well.  Denies any abdominal or pelvic pain.  Denies any vaginal bleeding or discharge.  Reports baseline bowel bladder function.  Has hot flashes sometimes at night.  Patch is sticking much better but she is taking it off at night and only wearing it during the daytime.  Mood symptoms have overall improved, still feels depressed intermittently.  Past  Medical/Surgical History: Past Medical History:  Diagnosis Date   Anxiety    Asthma    Depression    History of chemotherapy    02-22-2021  to 03-22-2021  for cervical cancer   History of COVID-19 02/2020   followed by pcp   (04-03-2021  per pt checks blood sugar once weekly)   History of external beam radiation therapy    02-25-2021  to 03-29-2021 for cervical cancer   History of radiation therapy    cervix - tandem and ring 04/09/2021  Dr Antony Blackbird   Iron deficiency anemia due to chronic blood loss    Malignant neoplasm cervix [facility])    oncologist--- dr gorsuch// radiation onologist-- dr Roselind Messier;   dx 01-16-2021;   01-24-2021  s/p robotic RSO and left salpingectomy for right ovarian mass (endometrioid carcinoma);  completed chemo 03-22-2021 and IMRT 03-29-2021 (scheduled for high dose brachytherapy 04-09-2021)   Migraines    migraine   Morbid obesity (HCC)    Type 2 diabetes mellitus (HCC)     Past Surgical History:  Procedure Laterality Date   IR IMAGING GUIDED PORT INSERTION  02/13/2021   IR REMOVAL TUN ACCESS W/ PORT W/O FL MOD SED  10/28/2021   OPERATIVE ULTRASOUND N/A 04/09/2021   Procedure: OPERATIVE ULTRASOUND;  Surgeon: Antony Blackbird, MD;  Location: [facility] [place];  Service: Urology;  Laterality: N/A;   ROBOTIC ASSISTED TOTAL HYSTERECTOMY N/A 05/21/2021   Procedure: XI ROBOTIC ASSISTED TOTAL HYSTERECTOMY;  Surgeon: Carver Fila, MD;  Location: [facility];  Service: Gynecology;  Laterality: N/A;   TANDEM RING INSERTION N/A 04/09/2021   Procedure: TANDEM RING INSERTION;  Surgeon: Antony Blackbird, MD;  Location: [facility];  Service: Urology;  Laterality: N/A;   TONSILLECTOMY     child   XI ROBOTIC ASSISTED OOPHORECTOMY N/A 01/24/2021   Procedure: XI ROBOTIC  ASSISTED RIGHT OOPHORECTOMY, OOPHOROPEXY OF LEFT OVARY;  Surgeon: Carver Fila, MD;  Location: [facility];  Service: Gynecology;  Laterality: N/A;   XI ROBOTIC ASSISTED OOPHORECTOMY Left  05/21/2021   Procedure: XI ROBOTIC ASSISTED LEFT OOPHORECTOMY;  Surgeon: Carver Fila, MD;  Location: [facility];  Service: Gynecology;  Laterality: Left;   XI ROBOTIC ASSISTED SALPINGECTOMY Bilateral 01/24/2021   Procedure: XI ROBOTIC ASSISTED SALPINGECTOMY; RIGHT URETERAL LYSIS; LYSIS OF ADHESIONS;  Surgeon: Carver Fila, MD;  Location: [facility];  Service: Gynecology;  Laterality: Bilateral;    Family History  Problem Relation Age of Onset   Asthma Mother    Diabetes Mother    Hypertension Mother    Breast cancer Mother    Asthma Father    Diabetes Father    Kidney disease Father    Diabetes Sister    Asthma Sister    Asthma Brother    Diabetes Brother    Cancer Maternal Aunt    Diabetes Maternal Uncle    Diabetes Maternal Grandmother    Hypertension Maternal Grandmother    Anemia Maternal Grandmother    Heart attack Paternal Grandfather    Cancer Paternal Grandfather    Colon cancer Neg Hx    Ovarian cancer Neg Hx    Endometrial cancer Neg Hx    Pancreatic cancer Neg Hx    Prostate cancer Neg Hx     Social History   Socioeconomic History   Marital status: Single    Spouse name: Not on file   Number of children: Not on file   Years of education: Not on file   Highest education level: Not on file  Occupational History   Not on file  Tobacco Use   Smoking status: Never   Smokeless tobacco: Never  Vaping Use   Vaping status: Never Used  Substance and Sexual Activity   Alcohol use: Never   Drug use: Never   Sexual activity: Not Currently    Birth control/protection: None    Comment: female partner  Other Topics Concern   Not on file  Social History Narrative   Not on file   Social Determinants of Health   Financial Resource Strain: Not on file  Food Insecurity: Not on file  Transportation Needs: Not on file  Physical Activity: Not on file  Stress: Not on file  Social Connections: Not on file    Current Medications:  Current Outpatient  Medications:    albuterol (VENTOLIN HFA) 108 (90 Base) MCG/ACT inhaler, Inhale 2 puffs into the lungs every 6 (six) hours as needed for wheezing or shortness of breath., Disp: 8 g, Rfl: 0   Ascorbic Acid (VITAMIN C) 500 MG CHEW, 1 capsule in the evening with iron tablets Orally Once a day for 90 days, Disp: , Rfl:    metFORMIN (GLUCOPHAGE) 500 MG tablet, Take 1 tablet (500 mg total) by mouth 2 (two) times daily with a meal., Disp: 60 tablet, Rfl: 1   propranolol (INDERAL) 40 MG tablet, Take 40 mg by mouth daily as needed (migraines)., Disp: , Rfl:    sertraline (ZOLOFT) 50 MG tablet, Take 1 tablet (50 mg total) by mouth daily., Disp: 30 tablet, Rfl: 6  Review of Systems: + Wheezing, hot flashes, headache, anxiety Denies appetite changes, fevers, chills, fatigue, unexplained weight changes. Denies hearing loss, neck lumps or masses, mouth sores, ringing in ears or voice changes. Denies cough.  Denies shortness of breath. Denies chest pain or palpitations. Denies leg swelling. Denies abdominal  distention, pain, blood in stools, constipation, diarrhea, nausea, vomiting, or early satiety. Denies pain with intercourse, dysuria, frequency, hematuria or incontinence. Denies pelvic pain, vaginal bleeding or vaginal discharge.   Denies joint pain, back pain or muscle pain/cramps. Denies itching, rash, or wounds. Denies dizziness, numbness or seizures. Denies swollen lymph nodes or glands, denies easy bruising or bleeding. Denies depression, confusion, or decreased concentration.  Physical Exam: BP 130/83 (BP Location: Left Arm, Patient Position: Sitting)   Pulse 96   Temp 98.5 F (36.9 C) (Oral)   Resp 18   Ht 5\' 6"  (1.676 m)   Wt (!) 301 lb (136.5 kg)   LMP 03/25/2021 (Approximate)   SpO2 96%   BMI 48.58 kg/m  General: Alert, oriented, no acute distress. HEENT: Normocephalic, atraumatic, sclera anicteric. Chest: Clear to auscultation bilaterally.  No wheezes or rhonchi. Cardiovascular:  Heart rate in 90s, regular rhythm, no murmurs. Abdomen: Obese, soft, nontender.  Normoactive bowel sounds.  No masses or hepatosplenomegaly appreciated.  Well-healed incisions. Extremities: Grossly normal range of motion.  Warm, well perfused.  No edema bilaterally. Skin: No rashes or lesions noted. GU: Normal appearing external genitalia without erythema, excoriation, or lesions.  Speculum exam reveals somewhat atrophic vaginal mucosa with radiation changes present.  Cuff intact, no lesions visible.  Bimanual exam reveals cuff intact, no masses or nodularity.  This is confirmed on rectovaginal exam.   Laboratory & Radiologic Studies: 11/29/22: CT A/P 1. Status post hysterectomy without evidence of recurrent or metastatic disease in the abdomen or pelvis. 2. Decreased size of the nonspecific right lower quadrant lymph nodes. 3. Hepatosplenomegaly with diffuse hepatic steatosis  Assessment & Plan: Alyssa Becker is a 27 y.o. 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  PDL1 10%. Last pap 05/2022 - NIML, HR HPV negative.   Patient is overall doing well and is NED on exam.    We reviewed most recent imaging.  No evidence of metastatic disease.   She continues on estrogen replacement although she is not keeping her patch on all the time.  I discussed the importance of changing her patch only twice a week so that she has more constant levels of estrogen.  I suspect that this is why she is having breakthrough hot flashes.   The patient is not interested in speaking with a psychologist or therapist about her mood symptoms.  She is overall feeling much better but interested in starting an antidepressant today.  Prescription sent for 50 mg of sertraline.  Printed out some information and discussed with her some of the more  common side effects.  Stressed the importance of stopping the medication and contacting me if she has any suicidal ideation.   Per NCCN surveillance recommendations, we will continue with visits every 3 months.  These will include a pelvic exam.  Pap test and HPV testing will performed yearly.   22 minutes of total time was spent for this patient encounter, including preparation, face-to-face counseling with the patient and coordination of care, and documentation of the encounter.  Eugene Garnet, MD  Division of Gynecologic Oncology  Department of Obstetrics and Gynecology  [facility]

## 2022-12-12 NOTE — Patient Instructions (Signed)
It was good to see you today.  I do not see or feel any evidence of cancer recurrence on your exam.  I will see you for follow-up in 3 months.  As always, if you develop any new and concerning symptoms before your next visit, please call to see me sooner.

## 2023-01-28 ENCOUNTER — Emergency Department (HOSPITAL_COMMUNITY)
Admission: EM | Admit: 2023-01-28 | Discharge: 2023-01-28 | Disposition: A | Discharge status: Home / Self Care | Payer: Medicaid Other | Attending: Emergency Medicine | Admitting: Emergency Medicine

## 2023-01-28 ENCOUNTER — Encounter (HOSPITAL_COMMUNITY): Payer: Self-pay

## 2023-01-28 ENCOUNTER — Other Ambulatory Visit: Payer: Self-pay

## 2023-01-28 DIAGNOSIS — E1165 Type 2 diabetes mellitus with hyperglycemia: Secondary | ICD-10-CM | POA: Insufficient documentation

## 2023-01-28 DIAGNOSIS — Z7984 Long term (current) use of oral hypoglycemic drugs: Secondary | ICD-10-CM | POA: Diagnosis not present

## 2023-01-28 DIAGNOSIS — R739 Hyperglycemia, unspecified: Secondary | ICD-10-CM

## 2023-01-28 DIAGNOSIS — J45909 Unspecified asthma, uncomplicated: Secondary | ICD-10-CM | POA: Insufficient documentation

## 2023-01-28 LAB — COMPREHENSIVE METABOLIC PANEL
ALT: 35 U/L (ref 0–44)
AST: 25 U/L (ref 15–41)
Albumin: 3.9 g/dL (ref 3.5–5.0)
Alkaline Phosphatase: 127 U/L — ABNORMAL HIGH (ref 38–126)
Anion gap: 9 (ref 5–15)
BUN: 14 mg/dL (ref 6–20)
CO2: 27 mmol/L (ref 22–32)
Calcium: 9.7 mg/dL (ref 8.9–10.3)
Chloride: 97 mmol/L — ABNORMAL LOW (ref 98–111)
Creatinine, Ser: 0.72 mg/dL (ref 0.44–1.00)
GFR, Estimated: >60 mL/min (ref >60)
Glucose, Bld: 292 mg/dL — ABNORMAL HIGH (ref 70–99)
Potassium: 3.9 mmol/L (ref 3.5–5.1)
Sodium: 133 mmol/L — ABNORMAL LOW (ref 135–145)
Total Bilirubin: 0.6 mg/dL (ref 0.0–1.2)
Total Protein: 7.3 g/dL (ref 6.5–8.1)

## 2023-01-28 LAB — CBC
HCT: 44.4 % (ref 36.0–46.0)
Hemoglobin: 15.1 g/dL — ABNORMAL HIGH (ref 12.0–15.0)
MCH: 26.8 pg (ref 26.0–34.0)
MCHC: 34 g/dL (ref 30.0–36.0)
MCV: 78.7 fL — ABNORMAL LOW (ref 80.0–100.0)
Platelets: 246 10*3/uL (ref 150–400)
RBC: 5.64 MIL/uL — ABNORMAL HIGH (ref 3.87–5.11)
RDW: 14.1 % (ref 11.5–15.5)
WBC: 7.9 10*3/uL (ref 4.0–10.5)
nRBC: 0 % (ref 0.0–0.2)

## 2023-01-28 LAB — CBG MONITORING, ED
Glucose-Capillary: 229 mg/dL — ABNORMAL HIGH (ref 70–99)
Glucose-Capillary: 278 mg/dL — ABNORMAL HIGH (ref 70–99)

## 2023-01-28 MED ORDER — SODIUM CHLORIDE 0.9 % IV BOLUS
1000.0000 mL | Freq: Once | INTRAVENOUS | Status: AC
  Administered 2023-01-28: 1000 mL via INTRAVENOUS

## 2023-01-28 MED ORDER — KETOROLAC TROMETHAMINE 15 MG/ML IJ SOLN
15.0000 mg | Freq: Once | INTRAMUSCULAR | Status: AC
  Administered 2023-01-28: 15 mg via INTRAVENOUS
  Filled 2023-01-28: qty 1

## 2023-01-28 NOTE — ED Triage Notes (Signed)
 Pt states her sugars have been increasing, they have been in the 300's, pt states she has also been getting headaches when her sugars are high.

## 2023-01-28 NOTE — ED Notes (Signed)
 Pt has headache- PA aware- will give Toradol per MD order.

## 2023-01-28 NOTE — Discharge Instructions (Signed)
 As we discussed, your workup in the ER today was reassuring for acute findings.  Laboratory evaluation did not reveal any emergent cause of your elevated blood sugar.  I recommend that you call your primary doctor at your earliest convenience to schedule a follow-up appointment to discuss any medication changes or manage treatment you may need moving forward for your higher blood sugars.  Will likely also need your A1c checked which can be done through her primary doctor.  Return if development of any new or worsening symptoms.

## 2023-01-28 NOTE — ED Provider Notes (Signed)
 [place] EMERGENCY DEPARTMENT AT [facility] Provider Note   CSN: [id number] Arrival date & time: 01/28/23  1551     History  Chief Complaint  Patient presents with   Hyperglycemia    Alyssa Becker is a 28 y.o. female.  Patient with history of morbid obesity, diabetes, asthma presents today with complaints of hyperglycemia.  She states that over the last few weeks her sugars have been higher than normal.  States that up until a few weeks ago her blood sugar was running in the 100-200s.  After the first of the year she has noted her sugars have been in the 300s.  She does not feel like she is changed anything in her diet or exercise habits.  She does state that she feels more generally unwell with her sugar at this level but denies any specific symptoms.  She has been taking her metformin  as prescribed.  She checks her sugars regularly.  She has not discussed these readings with her primary care doctor.  She denies neck stiffness fevers, chills, chest pain, shortness of breath, cough, congestion, nausea, vomiting, abdominal pain, or dysuria.   Hyperglycemia      Home Medications Prior to Admission medications   Medication Sig Start Date End Date Taking? Authorizing Provider  albuterol  (VENTOLIN  HFA) 108 (90 Base) MCG/ACT inhaler Inhale 2 puffs into the lungs every 6 (six) hours as needed for wheezing or shortness of breath. 05/17/22   Leath-Warren, Etta PARAS, NP  Ascorbic Acid (VITAMIN C) 500 MG CHEW 1 capsule in the evening with iron  tablets Orally Once a day for 90 days    [provider]  metFORMIN  (GLUCOPHAGE ) 500 MG tablet Take 1 tablet (500 mg total) by mouth 2 (two) times daily with a meal. 10/03/20   Lorence Ozell CROME, MD  propranolol (INDERAL) 40 MG tablet Take 40 mg by mouth daily as needed (migraines).    [provider]  sertraline  (ZOLOFT ) 50 MG tablet Take 1 tablet (50 mg total) by mouth daily. 12/12/22   Viktoria Comer SAUNDERS, MD   prochlorperazine  (COMPAZINE ) 10 MG tablet Take 1 tablet (10 mg total) by mouth every 6 (six) hours as needed (Nausea or vomiting). Patient not taking: Reported on 02/18/2021 02/05/21 03/28/21  Lonn Hicks, MD      Allergies    Pineapple and Prednisone    Review of Systems   Review of Systems  All other systems reviewed and are negative.   Physical Exam Updated Vital Signs BP 122/60   Pulse 97   Temp 98.8 F (37.1 C) (Oral)   Resp 20   Ht 5' 6 (1.676 m)   Wt 132.9 kg   LMP 03/25/2021 (Approximate)   SpO2 94%   BMI 47.29 kg/m  Physical Exam Vitals and nursing note reviewed.  Constitutional:      General: She is not in acute distress.    Appearance: Normal appearance. She is normal weight. She is not ill-appearing, toxic-appearing or diaphoretic.  HENT:     Head: Normocephalic and atraumatic.  Cardiovascular:     Rate and Rhythm: Normal rate and regular rhythm.     Heart sounds: Normal heart sounds.  Pulmonary:     Effort: Pulmonary effort is normal. No respiratory distress.     Breath sounds: Normal breath sounds.  Abdominal:     General: Abdomen is flat.     Palpations: Abdomen is soft.     Tenderness: There is no abdominal tenderness.  Musculoskeletal:  General: Normal range of motion.     Cervical back: Normal range of motion and neck supple.     Right lower leg: No edema.     Left lower leg: No edema.  Skin:    General: Skin is warm and dry.  Neurological:     General: No focal deficit present.     Mental Status: She is alert.  Psychiatric:        Mood and Affect: Mood normal.        Behavior: Behavior normal.     ED Results / Procedures / Treatments   Labs (all labs ordered are listed, but only abnormal results are displayed) Labs Reviewed  CBC - Abnormal; Notable for the following components:      Result Value   RBC 5.64 (*)    Hemoglobin 15.1 (*)    MCV 78.7 (*)    All other components within normal limits  COMPREHENSIVE METABOLIC PANEL -  Abnormal; Notable for the following components:   Sodium 133 (*)    Chloride 97 (*)    Glucose, Bld 292 (*)    Alkaline Phosphatase 127 (*)    All other components within normal limits  CBG MONITORING, ED - Abnormal; Notable for the following components:   Glucose-Capillary 229 (*)    All other components within normal limits  CBG MONITORING, ED - Abnormal; Notable for the following components:   Glucose-Capillary 278 (*)    All other components within normal limits  URINALYSIS, ROUTINE W REFLEX MICROSCOPIC    EKG None  Radiology No results found.  Procedures Procedures    Medications Ordered in ED Medications  sodium chloride  0.9 % bolus 1,000 mL (0 mLs Intravenous Stopped 01/28/23 2227)  ketorolac  (TORADOL ) 15 MG/ML injection 15 mg (15 mg Intravenous Given 01/28/23 2106)    ED Course/ Medical Decision Making/ A&P                                 Medical Decision Making Amount and/or Complexity of Data Reviewed Labs: ordered.  Risk Prescription drug management.   This patient is a 28 y.o. female who presents to the ED for concern of hyperglycemia, this involves an extensive number of treatment options, and is a complaint that carries with it a high risk of complications and morbidity. The emergent differential diagnosis prior to evaluation includes, but is not limited to,  DKA/HHS, hyperglycemia, infection/sepsis . This is not an exhaustive differential.   Past Medical History / Co-morbidities / Social History:  has a past medical history of Anxiety, Asthma, Depression, History of chemotherapy, History of COVID-19 (02/2020), History of external beam radiation therapy, History of radiation therapy, Iron  deficiency anemia due to chronic blood loss, Malignant neoplasm cervix (HCC), Migraines, Morbid obesity (HCC), and Type 2 diabetes mellitus (HCC).  Additional history: Chart reviewed. Pertinent results include: patient on metformin  500 mg BID for her diabetes  Physical  Exam: Physical exam performed. The pertinent findings include: no acute physical exam abnormalities  Lab Tests: I ordered, and personally interpreted labs.  The pertinent results include:  CBG 229, no leukocytosis. Na 133, chloride 97   Medications: I ordered medication including fluids  for dehydration/hyperglycemia. Reevaluation of the patient after these medicines showed that the patient improved. I have reviewed the patients home medicines and have made adjustments as needed.   Disposition: After consideration of the diagnostic results and the patients response to treatment, I feel  that emergency department workup does not suggest an emergent condition requiring admission or immediate intervention beyond what has been performed at this time. The plan is: discharge with close outpatient follow-up and return precautions. Patients labs without signs of DKA or HHS. She has no infectious signs or symptoms. Suspect she may need her metformin  dosage optimized vs considering other medications such as GLP-1. Will leave this up to her pcp who she follows with. Evaluation and diagnostic testing in the emergency department does not suggest an emergent condition requiring admission or immediate intervention beyond what has been performed at this time.  Plan for discharge with close PCP follow-up.  Patient is understanding and amenable with plan, educated on red flag symptoms that would prompt immediate return.  Patient discharged in stable condition.  Final Clinical Impression(s) / ED Diagnoses Final diagnoses:  Hyperglycemia    Rx / DC Orders ED Discharge Orders     None     An After Visit Summary was printed and given to the patient.     Nora Lauraine LABOR, PA-C 01/28/23 2346    Mannie Pac T, DO 01/29/23 2243

## 2023-01-28 NOTE — ED Notes (Signed)
 [id number]

## 2023-03-13 ENCOUNTER — Encounter: Payer: Self-pay | Admitting: Gynecologic Oncology

## 2023-03-13 ENCOUNTER — Inpatient Hospital Stay: Payer: Medicaid Other | Attending: Gynecologic Oncology | Admitting: Gynecologic Oncology

## 2023-03-13 VITALS — BP 124/84 | HR 98 | Temp 98.7°F | Resp 20 | Wt 297.2 lb

## 2023-03-13 DIAGNOSIS — Z8541 Personal history of malignant neoplasm of cervix uteri: Secondary | ICD-10-CM | POA: Diagnosis not present

## 2023-03-13 DIAGNOSIS — E8940 Asymptomatic postprocedural ovarian failure: Secondary | ICD-10-CM | POA: Insufficient documentation

## 2023-03-13 DIAGNOSIS — Z9221 Personal history of antineoplastic chemotherapy: Secondary | ICD-10-CM | POA: Insufficient documentation

## 2023-03-13 DIAGNOSIS — Z90710 Acquired absence of both cervix and uterus: Secondary | ICD-10-CM | POA: Diagnosis not present

## 2023-03-13 DIAGNOSIS — Z08 Encounter for follow-up examination after completed treatment for malignant neoplasm: Secondary | ICD-10-CM | POA: Diagnosis present

## 2023-03-13 DIAGNOSIS — C539 Malignant neoplasm of cervix uteri, unspecified: Secondary | ICD-10-CM

## 2023-03-13 DIAGNOSIS — Z90722 Acquired absence of ovaries, bilateral: Secondary | ICD-10-CM | POA: Insufficient documentation

## 2023-03-13 DIAGNOSIS — Z9079 Acquired absence of other genital organ(s): Secondary | ICD-10-CM | POA: Diagnosis not present

## 2023-03-13 DIAGNOSIS — Z6841 Body Mass Index (BMI) 40.0 and over, adult: Secondary | ICD-10-CM | POA: Diagnosis not present

## 2023-03-13 DIAGNOSIS — Z79890 Hormone replacement therapy (postmenopausal): Secondary | ICD-10-CM | POA: Insufficient documentation

## 2023-03-13 DIAGNOSIS — Z923 Personal history of irradiation: Secondary | ICD-10-CM | POA: Insufficient documentation

## 2023-03-13 MED ORDER — ESTRADIOL 1 MG PO TABS: 1.0000 mg | ORAL_TABLET | Freq: Every day | ORAL | 12 refills | Status: DC

## 2023-03-13 NOTE — Patient Instructions (Signed)
 It was good to see you today.  I do not see or feel any evidence of cancer recurrence on your exam.  I will see you for follow-up in 3 months.  I sent a new prescription for the estrogen pill to your pharmacy. Please let me know if you have any issue getting this prescription.  As always, if you develop any new and concerning symptoms before your next visit, please call to see me sooner.

## 2023-03-13 NOTE — Progress Notes (Signed)
 Called patient whom left before receiving gas card to ask if she would like it to be mailed.  She states she would. Erica in registration will place in outgoing mail to patient.  She has my card for any additional financial questions or concerns.

## 2023-03-13 NOTE — Progress Notes (Signed)
 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 Large indeterminate right adnexal mass with solid hypervascular and cystic components. This appears separate from the base of the appendix on preceding CT and is likely arising from the right ovary based on relationship to the gonadal vessels on CT. Given the patient's young age, hypervascularity and leukocytosis, findings could represent an atypical tubo-ovarian abscess. However, the appearance is concerning for ovarian malignancy despite the patient's young age. Recommend prompt gynecology consultation. Pelvic MRI without and with contrast may be helpful for further evaluation. 2. The left ovary appears normal. Mild nonspecific thickening of the endometrium, within physiologic limits.   01/02/2021 Pathology Results   SPECIMEN ADEQUACY: Satisfactory for evaluation; transformation zone component PRESENT. INTERPRETATION: - Adenocarcinoma, NOS   01/15/2021 Imaging   MRI pelvis  13.0 cm cystic and solid mass arising from the right ovary, highly suspicious for ovarian carcinoma.   Mild endometrial thickening and 4.5 cm soft tissue mass within the endocervical canal. Differential diagnosis includes prolapsing endometrial polyp or carcinoma, or primary cervical carcinoma.   No evidence of pelvic metastatic disease.   01/16/2021 Initial Diagnosis   Malignant neoplasm of cervix (HCC)   01/22/2021 PET scan   1. Large cystic/necrotic right adnexal mass is markedly hypermetabolic and consistent with known neoplasm. 2. Extensive hypermetabolic tumor involving the endometrium and cervix. 3. Scattered borderline enlarged and mildly hypermetabolic retroperitoneal and external  iliac nodes. 4. No findings for metastatic disease involving the chest or bony structures.   01/24/2021 Surgery   Pre-operative Diagnosis: Complex adnexal mass, elevated CA-125, locally invasive cervical adenocarcinoma versus endometrial adenocarcinoma   Post-operative Diagnosis: same, high-grade carcinoma of the right ovary, suspected history of superinfection of the adnexal mass   Operation: Robotic-assisted laparoscopic bilateral salpingectomy, right oophorectomy, lysis of adhesions of approximately 45 minutes, right ureterolysis   Surgeon: Eugene Garnet MD    Operative Findings: On EUA, cervix enlarged and firm, fleshy tumor replacing the endocervix.  No definitive parametrial involvement appreciated.  Even gentle exam because significant bleeding so vagina was packed with lap sponges for the remainder of surgery.  On intra-abdominal exam, normal upper abdominal survey including omentum, liver edge, diaphragm, and stomach.  Normal small bowel.  Some physiologic adhesions of the sigmoid epiploica to the left sidewall.  Normal-appearing left ovary measuring approximately 3-4 cm.  Normal-appearing bilateral fallopian tubes.  Uterus approximately 8-10 cm and overall normal in appearance.  Right ovary replaced by a smooth but vascular 10 cm mass with multiple loculations.  Ovary with dense adhesions to the right pelvic sidewall.  Upon very careful manipulation of the mass for its removal, there was some drainage of fluid noted that was somewhat purulent.  This was sent for culture and Gram stain.  Upon contained mass removal with fractionation of the mass, tumor versus necrotic material was noted.  Frozen section consistent with high-grade carcinoma, unable to differentiate on frozen section whether this is the same cancer being seen on her cervical biopsies.  Right ureterolysis performed given oozing services from the broad ligament and peritoneum where adhesions were lysed between the ovary and  peritoneum, to assure ureter not in close proximity.  No obvious adenopathy.  No ascites.  Left ovary transposed out of the  pelvis along the left gutter, clips placed along the sidewall as well as the superior and inferior aspect of the ovary.     01/24/2021 Pathology Results   FINAL MICROSCOPIC DIAGNOSIS:   A. OVARY AND FALLOPIAN TUBE, RIGHT, SALPINGO OOPHORECTOMY:  - Endometrioid carcinoma, moderately differentiated, involving right ovary  - No evidence of ovarian surface involvement by carcinoma  - Segment of fallopian tube, negative for carcinoma  - See oncology table  - See comment   B. FALLOPIAN TUBE, LEFT, SALPINGECTOMY:  - Segment of fallopian tube, negative for carcinoma   A.  Immunostain for p16 shows patchy staining.  Immunostain for p53 shows a normal, wild-type expression pattern.  This immunoprofile is consistent with above interpretation.  Patient's previous cervical  biopsy was reviewed - it shows moderately differentiated carcinoma with a very similar histomorphology and very likely presents metastasis from the ovarian tumor to the cervix.    02/05/2021 Cancer Staging   Staging form: Cervix Uteri, AJCC Version 9 - Pathologic stage from 02/05/2021: FIGO Stage II (pT2, pN0, cM0) - Signed by Artis Delay, MD on 02/05/2021 Stage prefix: Initial diagnosis   02/14/2021 Procedure   Successful placement of a right internal jugular approach power injectable Port-A-Cath. The catheter is ready for immediate use   02/22/2021 - 03/22/2021 Chemotherapy   Patient is on Treatment Plan : Cervical cancer Cisplatin q7d     02/25/2021 - 03/29/2021 Radiation Therapy   Radiation Treatment Dates: 02/25/2021 through 04/09/2021 Pelvic IMRT: 02/25/21 through 03/29/21 HDR-brachy: 04/09/21 Site Technique Total Dose (Gy) Dose per Fx (Gy) Completed Fx Beam Energies  Pelvis: Pelvis IMRT 45/45 1.8 25/25 6X  Cervix: Cervix_Bst_Fx1 HDR-brachy 5.5/5.5 5.5 1/1 Ir-192         05/13/2021 Imaging   1. Status post  bilateral oophorectomy. Previously seen large hypermetabolic right adnexal mass has been resected. Nodular soft tissue in the vicinity of both the left and right oophorectomy bed, likely postoperative in nature. Attention on follow-up. 2. Previously noted uterine/cervical mass is resolved, consistent with treatment response. 3. Prominent, previously FDG avid iliac and retroperitoneal lymph nodes are slightly diminished in size, consistent with treatment response. 4. Hypodense liver lesion of hepatic segment IVa measuring 1.5 x 1.2 cm, not previously appreciated by contrast enhanced CT or PET-CT, incompletely characterized although worrisome for hepatic metastasis. This could be further characterized by multiphasic contrast enhanced MRI. 5. Hepatomegaly and hepatic steatosis. 6. Splenomegaly.     05/21/2021 Surgery   Robotic-assisted modified radical hysterectomy with left oophorectomy, cystoscopy  Findings:  On EUA, some radiation changes noted with deviation of the cervix posteriorly. On intra-abdominal entry, normal upper abdominal survey. Normal omentum, small and large bowel. Surgically absent right adnexa. Left ovary pexed along sidewall above the pelvic brim (normal in appearance). Uterus 8-10 cm and somewhat bulbous. Significant retroperitoneal edema, some fibrosis. 2-3 cm tumor implant along the right parametrium and distal uterosacral ligament, free from the cervix itself. No obvious paracervical tumor extension. No gross adenopathy. No ascites.  On cystoscopy, bladder intact, good efflux noted from bilateral ureteral orifices.   05/21/2021 Pathology Results   A. UTERUS AND CERVIX, HYSTERECTOMY:  - Microscopic fragment in the upper endocervical canal suspicious for  treated adenocarcinoma associated with extensive treatment response.  - Fibrosis and cytologic atypia in the endometrium consistent with  treatment effects.  - See comment.   B. PERIMETRIUM, RIGHT, EXCISION  - Amorphous  material associated with histiocytic response.  - Connective tissue with inflammation, fibrosis and dystrophic  calcifications.  -  No malignancy identified.   C. OVARY, LEFT, OOPHORECTOMY:  - Benign ovary.  - No malignancy identified.   COMMENT:  There is a 1.7 cm polypoid mass involving the anterior endocervical canal which histologically shows dense fibrosis with scattered glands with cytologic atypia consistent with therapy effects.  This area has a microscopic, partially detached fragment with marked atypia and architectural changes suspicious for microscopic residual adenocarcinoma.  Elsewhere, the endometrium shows foci of similar fibrosis with patchy cytologic atypia consistent with therapy effects.  There are foci where fibrosis extends into the adjacent upper portions of the myometrium.  There is insufficient residual suspicious tissue present to resolve the location of the primary in this case.  The pattern of fibrotic apparent therapy affects is more suggestive of an ovarian metastasis from an endocervical or endometrial primary.    07/16/2021 PET scan   1. Interval hysterectomy with mild residual uptake in the surgical bed. This examination will serve as baseline for future comparison. No evidence of distant metastatic disease. 2. Relatively patchy/mottled uptake throughout the spine may be treatment related. No definite focal lesion. 3. Severely enlarged steatotic liver. 4. Splenomegaly.   10/28/2021 Procedure   Successful removal of Port-A-Cath without immediate post procedural complication.     Interval History: Doing well.  Denies any vaginal bleeding or discharge.  Reports baseline bowel bladder function.  Has been doing well on estrogen replacement, denies any hot flashes or mood symptoms.  Is overall feeling much happier the last few months.  Has been going to the gym, working out, and physically feeling better.  Past Medical/Surgical History: Past Medical History:   Diagnosis Date   Anxiety    Asthma    Depression    History of chemotherapy    02-22-2021  to 03-22-2021  for cervical cancer   History of COVID-19 02/2020   followed by pcp   (04-03-2021  per pt checks blood sugar once weekly)   History of external beam radiation therapy    02-25-2021  to 03-29-2021 for cervical cancer   History of radiation therapy    cervix - tandem and ring 04/09/2021  Dr Antony Blackbird   Iron deficiency anemia due to chronic blood loss    Malignant neoplasm cervix [facility])    oncologist--- dr gorsuch// radiation onologist-- dr Roselind Messier;   dx 01-16-2021;   01-24-2021  s/p robotic RSO and left salpingectomy for right ovarian mass (endometrioid carcinoma);  completed chemo 03-22-2021 and IMRT 03-29-2021 (scheduled for high dose brachytherapy 04-09-2021)   Migraines    migraine   Morbid obesity (HCC)    Type 2 diabetes mellitus (HCC)     Past Surgical History:  Procedure Laterality Date   IR IMAGING GUIDED PORT INSERTION  02/13/2021   IR REMOVAL TUN ACCESS W/ PORT W/O FL MOD SED  10/28/2021   OPERATIVE ULTRASOUND N/A 04/09/2021   Procedure: OPERATIVE ULTRASOUND;  Surgeon: Antony Blackbird, MD;  Location: [facility];  Service: Urology;  Laterality: N/A;   ROBOTIC ASSISTED TOTAL HYSTERECTOMY N/A 05/21/2021   Procedure: XI ROBOTIC ASSISTED TOTAL HYSTERECTOMY;  Surgeon: Carver Fila, MD;  Location: [facility];  Service: Gynecology;  Laterality: N/A;   TANDEM RING INSERTION N/A 04/09/2021   Procedure: TANDEM RING INSERTION;  Surgeon: Antony Blackbird, MD;  Location: [facility];  Service: Urology;  Laterality: N/A;   TONSILLECTOMY     child   XI ROBOTIC ASSISTED OOPHORECTOMY N/A 01/24/2021   Procedure: XI ROBOTIC ASSISTED RIGHT OOPHORECTOMY, OOPHOROPEXY OF LEFT OVARY;  Surgeon: Carver Fila, MD;  Location: [facility];  Service: Gynecology;  Laterality: N/A;   XI ROBOTIC ASSISTED OOPHORECTOMY Left 05/21/2021   Procedure: XI ROBOTIC ASSISTED LEFT  OOPHORECTOMY;  Surgeon: Carver Fila, MD;  Location: [facility];  Service: Gynecology;  Laterality: Left;   XI ROBOTIC ASSISTED SALPINGECTOMY Bilateral 01/24/2021   Procedure: XI ROBOTIC ASSISTED SALPINGECTOMY; RIGHT URETERAL LYSIS; LYSIS OF ADHESIONS;  Surgeon: Carver Fila, MD;  Location: [facility];  Service: Gynecology;  Laterality: Bilateral;    Family History  Problem Relation Age of Onset   Asthma Mother    Diabetes Mother    Hypertension Mother    Breast cancer Mother    Asthma Father    Diabetes Father    Kidney disease Father    Diabetes Sister    Asthma Sister    Asthma Brother    Diabetes Brother    Cancer Maternal Aunt    Diabetes Maternal Uncle    Diabetes Maternal Grandmother    Hypertension Maternal Grandmother    Anemia Maternal Grandmother    Heart attack Paternal Grandfather    Cancer Paternal Grandfather    Colon cancer Neg Hx    Ovarian cancer Neg Hx    Endometrial cancer Neg Hx    Pancreatic cancer Neg Hx    Prostate cancer Neg Hx     Social History   Socioeconomic History   Marital status: Single    Spouse name: Not on file   Number of children: Not on file   Years of education: Not on file   Highest education level: Not on file  Occupational History   Not on file  Tobacco Use   Smoking status: Never   Smokeless tobacco: Never  Vaping Use   Vaping status: Never Used  Substance and Sexual Activity   Alcohol use: Never   Drug use: Never   Sexual activity: Not Currently    Birth control/protection: None    Comment: female partner  Other Topics Concern   Not on file  Social History Narrative   Not on file   Social Drivers of Health   Financial Resource Strain: Not on file  Food Insecurity: Not on file  Transportation Needs: Not on file  Physical Activity: Not on file  Stress: Not on file  Social Connections: Not on file    Current Medications:  Current Outpatient Medications:    estradiol (ESTRACE) 1 MG tablet, Take 1  tablet (1 mg total) by mouth daily., Disp: 30 tablet, Rfl: 12   albuterol (VENTOLIN HFA) 108 (90 Base) MCG/ACT inhaler, Inhale 2 puffs into the lungs every 6 (six) hours as needed for wheezing or shortness of breath., Disp: 8 g, Rfl: 0   Ascorbic Acid (VITAMIN C) 500 MG CHEW, 1 capsule in the evening with iron tablets Orally Once a day for 90 days, Disp: , Rfl:    metFORMIN (GLUCOPHAGE) 500 MG tablet, Take 1 tablet (500 mg total) by mouth 2 (two) times daily with a meal., Disp: 60 tablet, Rfl: 1   propranolol (INDERAL) 40 MG tablet, Take 40 mg by mouth daily as needed (migraines)., Disp: , Rfl:    sertraline (ZOLOFT) 50 MG tablet, Take 1 tablet (50 mg total) by mouth daily., Disp: 30 tablet, Rfl: 6  Review of Systems: + headaches intermittently Denies appetite changes, fevers, chills, fatigue, unexplained weight changes. Denies hearing loss, neck lumps or masses, mouth sores, ringing in ears or voice changes. Denies cough or wheezing.  Denies shortness  of breath. Denies chest pain or palpitations. Denies leg swelling. Denies abdominal distention, pain, blood in stools, constipation, diarrhea, nausea, vomiting, or early satiety. Denies pain with intercourse, dysuria, frequency, hematuria or incontinence. Denies hot flashes, pelvic pain, vaginal bleeding or vaginal discharge.   Denies joint pain, back pain or muscle pain/cramps. Denies itching, rash, or wounds. Denies dizziness, numbness or seizures. Denies swollen lymph nodes or glands, denies easy bruising or bleeding. Denies anxiety, depression, confusion, or decreased concentration.  Physical Exam: BP 124/84 (BP Location: Left Arm, Patient Position: Sitting)   Pulse 98   Temp 98.7 F (37.1 C) (Oral)   Resp 20   Wt 297 lb 3.2 oz (134.8 kg)   LMP 03/25/2021 (Approximate)   SpO2 98%   BMI 47.97 kg/m  General: Alert, oriented, no acute distress. HEENT: Normocephalic, atraumatic, sclera anicteric. Chest: Clear to auscultation  bilaterally.  No wheezes or rhonchi. Cardiovascular: Heart rate in 90s, regular rhythm, no murmurs. Abdomen: Obese, soft, nontender.  Normoactive bowel sounds.  No masses or hepatosplenomegaly appreciated.  Well-healed incisions. Extremities: Grossly normal range of motion.  Warm, well perfused.  No edema bilaterally. Skin: No rashes or lesions noted. GU: Normal appearing external genitalia without erythema, excoriation, or lesions.  Speculum exam reveals minimally atrophic vaginal mucosa with radiation changes present.  Cuff intact, no lesions visible.  Bimanual exam reveals cuff intact, no masses or nodularity.    Laboratory & Radiologic Studies: None new  Assessment & Plan: Alyssa Becker is a 28 y.o. 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  PDL1 10%. Last pap 05/2022 - NIML, HR HPV negative.   Patient is overall doing well and is NED on exam.    Last imaging in 11/2022 showed no evidence of metastatic disease.   She continues on estrogen replacement.  New prescription sent for Estrace pill.   Encouragement given on healthy lifestyle changes made since her last visit.  Per NCCN surveillance recommendations, we will continue with visits every 3 months.  These will include a pelvic exam.  Pap test and HPV testing will performed yearly (due 05/2023).   22 minutes of total time was spent for this patient encounter, including preparation, face-to-face counseling with the patient and coordination of care, and documentation of the encounter.  Eugene Garnet, MD  Division of Gynecologic Oncology  Department of Obstetrics and Gynecology  [facility]

## 2023-05-27 ENCOUNTER — Other Ambulatory Visit: Payer: Self-pay

## 2023-05-27 ENCOUNTER — Encounter (HOSPITAL_COMMUNITY): Payer: Self-pay | Admitting: *Deleted

## 2023-05-27 ENCOUNTER — Emergency Department (HOSPITAL_COMMUNITY)
Admission: EM | Admit: 2023-05-27 | Discharge: 2023-05-27 | Disposition: A | Discharge status: Home / Self Care | Attending: Emergency Medicine | Admitting: Emergency Medicine

## 2023-05-27 ENCOUNTER — Emergency Department (HOSPITAL_COMMUNITY)

## 2023-05-27 DIAGNOSIS — Z7984 Long term (current) use of oral hypoglycemic drugs: Secondary | ICD-10-CM | POA: Insufficient documentation

## 2023-05-27 DIAGNOSIS — J45909 Unspecified asthma, uncomplicated: Secondary | ICD-10-CM | POA: Insufficient documentation

## 2023-05-27 DIAGNOSIS — R0602 Shortness of breath: Secondary | ICD-10-CM | POA: Insufficient documentation

## 2023-05-27 DIAGNOSIS — J45901 Unspecified asthma with (acute) exacerbation: Secondary | ICD-10-CM

## 2023-05-27 DIAGNOSIS — R0902 Hypoxemia: Secondary | ICD-10-CM | POA: Diagnosis not present

## 2023-05-27 LAB — CBC WITH DIFFERENTIAL/PLATELET
Abs Immature Granulocytes: 0.06 10*3/uL (ref 0.00–0.07)
Basophils Absolute: 0.1 10*3/uL (ref 0.0–0.1)
Basophils Relative: 1 %
Eosinophils Absolute: 0.6 10*3/uL — ABNORMAL HIGH (ref 0.0–0.5)
Eosinophils Relative: 5 %
HCT: 47.8 % — ABNORMAL HIGH (ref 36.0–46.0)
Hemoglobin: 16.4 g/dL — ABNORMAL HIGH (ref 12.0–15.0)
Immature Granulocytes: 1 %
Lymphocytes Relative: 31 %
Lymphs Abs: 3.5 10*3/uL (ref 0.7–4.0)
MCH: 26.9 pg (ref 26.0–34.0)
MCHC: 34.3 g/dL (ref 30.0–36.0)
MCV: 78.4 fL — ABNORMAL LOW (ref 80.0–100.0)
Monocytes Absolute: 0.6 10*3/uL (ref 0.1–1.0)
Monocytes Relative: 5 %
Neutro Abs: 6.5 10*3/uL (ref 1.7–7.7)
Neutrophils Relative %: 57 %
Platelets: 324 10*3/uL (ref 150–400)
RBC: 6.1 MIL/uL — ABNORMAL HIGH (ref 3.87–5.11)
RDW: 13.9 % (ref 11.5–15.5)
WBC: 11.2 10*3/uL — ABNORMAL HIGH (ref 4.0–10.5)
nRBC: 0 % (ref 0.0–0.2)

## 2023-05-27 LAB — RESP PANEL BY RT-PCR (RSV, FLU A&B, COVID)  RVPGX2
Influenza A by PCR: NEGATIVE
Influenza B by PCR: NEGATIVE
Resp Syncytial Virus by PCR: NEGATIVE
SARS Coronavirus 2 by RT PCR: NEGATIVE

## 2023-05-27 LAB — BASIC METABOLIC PANEL WITH GFR
Anion gap: 8 (ref 5–15)
BUN: 12 mg/dL (ref 6–20)
CO2: 26 mmol/L (ref 22–32)
Calcium: 9.6 mg/dL (ref 8.9–10.3)
Chloride: 99 mmol/L (ref 98–111)
Creatinine, Ser: 0.59 mg/dL (ref 0.44–1.00)
GFR, Estimated: >60 mL/min (ref >60)
Glucose, Bld: 225 mg/dL — ABNORMAL HIGH (ref 70–99)
Potassium: 4.1 mmol/L (ref 3.5–5.1)
Sodium: 133 mmol/L — ABNORMAL LOW (ref 135–145)

## 2023-05-27 LAB — HCG, SERUM, QUALITATIVE: Preg, Serum: NEGATIVE

## 2023-05-27 MED ORDER — DEXAMETHASONE SODIUM PHOSPHATE 10 MG/ML IJ SOLN
10.0000 mg | Freq: Once | INTRAMUSCULAR | Status: AC
  Administered 2023-05-27: 10 mg via INTRAVENOUS
  Filled 2023-05-27: qty 1

## 2023-05-27 MED ORDER — IBUPROFEN 800 MG PO TABS
800.0000 mg | ORAL_TABLET | Freq: Once | ORAL | Status: AC
  Administered 2023-05-27: 800 mg via ORAL
  Filled 2023-05-27: qty 1

## 2023-05-27 MED ORDER — ACETAMINOPHEN 500 MG PO TABS
1000.0000 mg | ORAL_TABLET | Freq: Once | ORAL | Status: AC
  Administered 2023-05-27: 1000 mg via ORAL
  Filled 2023-05-27: qty 2

## 2023-05-27 MED ORDER — ALBUTEROL SULFATE (2.5 MG/3ML) 0.083% IN NEBU
10.0000 mg/h | INHALATION_SOLUTION | Freq: Once | RESPIRATORY_TRACT | Status: AC
  Administered 2023-05-27: 10 mg/h via RESPIRATORY_TRACT
  Filled 2023-05-27: qty 12

## 2023-05-27 NOTE — ED Provider Notes (Signed)
 [place] EMERGENCY DEPARTMENT AT [facility] Provider Note   CSN: [id number] Arrival date & time: 05/27/23  0358     History  Chief Complaint  Patient presents with   Shortness of Breath    Alyssa Becker is a 28 y.o. female.  Patient presents to the emergency department from home by ambulance for shortness of breath.  She does have a history of asthma.  Patient started wheezing tonight try to use her inhaler without improvement.  EMS report mild hypoxia, increased work of breathing upon their arrival.  She has been given magnesium  and 2 DuoNeb's during transport.       Home Medications Prior to Admission medications   Medication Sig Start Date End Date Taking? Authorizing Provider  albuterol  (VENTOLIN  HFA) 108 (90 Base) MCG/ACT inhaler Inhale 2 puffs into the lungs every 6 (six) hours as needed for wheezing or shortness of breath. 05/17/22   Leath-Warren, Belen Bowers, NP  Ascorbic Acid (VITAMIN C) 500 MG CHEW 1 capsule in the evening with iron  tablets Orally Once a day for 90 days    [provider]  estradiol  (ESTRACE ) 1 MG tablet Take 1 tablet (1 mg total) by mouth daily. 03/13/23   Suzi Essex, MD  metFORMIN  (GLUCOPHAGE ) 500 MG tablet Take 1 tablet (500 mg total) by mouth 2 (two) times daily with a meal. 10/03/20   Othelia Blinks, MD  propranolol (INDERAL) 40 MG tablet Take 40 mg by mouth daily as needed (migraines).    [provider]  sertraline  (ZOLOFT ) 50 MG tablet Take 1 tablet (50 mg total) by mouth daily. 12/12/22   Suzi Essex, MD  prochlorperazine  (COMPAZINE ) 10 MG tablet Take 1 tablet (10 mg total) by mouth every 6 (six) hours as needed (Nausea or vomiting). Patient not taking: Reported on 02/18/2021 02/05/21 03/28/21  Almeda Jacobs, MD      Allergies    Pineapple and Prednisone    Review of Systems   Review of Systems  Physical Exam Updated Vital Signs BP 129/60   Pulse 99   Temp 98.2 F (36.8 C) (Oral)   Resp 19    Ht 5\' 6"  (1.676 m)   Wt 132.5 kg   LMP 03/25/2021 (Approximate)   SpO2 98%   BMI 47.13 kg/m  Physical Exam Vitals and nursing note reviewed.  Constitutional:      General: She is not in acute distress.    Appearance: She is well-developed.  HENT:     Head: Normocephalic and atraumatic.     Mouth/Throat:     Mouth: Mucous membranes are moist.  Eyes:     General: Vision grossly intact. Gaze aligned appropriately.     Extraocular Movements: Extraocular movements intact.     Conjunctiva/sclera: Conjunctivae normal.  Cardiovascular:     Rate and Rhythm: Normal rate and regular rhythm.     Pulses: Normal pulses.     Heart sounds: Normal heart sounds, S1 normal and S2 normal. No murmur heard.    No friction rub. No gallop.  Pulmonary:     Effort: Tachypnea and accessory muscle usage present. No respiratory distress.     Breath sounds: Decreased air movement present. Decreased breath sounds and wheezing present.  Abdominal:     General: Bowel sounds are normal.     Palpations: Abdomen is soft.     Tenderness: There is no abdominal tenderness. There is no guarding or rebound.     Hernia: No hernia is present.  Musculoskeletal:  General: No swelling.     Cervical back: Full passive range of motion without pain, normal range of motion and neck supple. No spinous process tenderness or muscular tenderness. Normal range of motion.     Right lower leg: No edema.     Left lower leg: No edema.  Skin:    General: Skin is warm and dry.     Capillary Refill: Capillary refill takes less than 2 seconds.     Findings: No ecchymosis, erythema, rash or wound.  Neurological:     General: No focal deficit present.     Mental Status: She is alert and oriented to person, place, and time.     GCS: GCS eye subscore is 4. GCS verbal subscore is 5. GCS motor subscore is 6.     Cranial Nerves: Cranial nerves 2-12 are intact.     Sensory: Sensation is intact.     Motor: Motor function is intact.      Coordination: Coordination is intact.  Psychiatric:        Attention and Perception: Attention normal.        Mood and Affect: Mood normal.        Speech: Speech normal.        Behavior: Behavior normal.     ED Results / Procedures / Treatments   Labs (all labs ordered are listed, but only abnormal results are displayed) Labs Reviewed  CBC WITH DIFFERENTIAL/PLATELET - Abnormal; Notable for the following components:      Result Value   WBC 11.2 (*)    RBC 6.10 (*)    Hemoglobin 16.4 (*)    HCT 47.8 (*)    MCV 78.4 (*)    Eosinophils Absolute 0.6 (*)    All other components within normal limits  BASIC METABOLIC PANEL WITH GFR - Abnormal; Notable for the following components:   Sodium 133 (*)    Glucose, Bld 225 (*)    All other components within normal limits  RESP PANEL BY RT-PCR (RSV, FLU A&B, COVID)  [id number]  HCG, SERUM, QUALITATIVE    EKG None  Radiology DG Chest Port 1 View Result Date: 05/27/2023 CLINICAL DATA:  Shortness of breath EXAM: PORTABLE CHEST 1 VIEW COMPARISON:  07/04/2022 FINDINGS: Chronic elevation of the right diaphragm, history of hepatomegaly by CT. Normal heart size and mediastinal contours. No acute infiltrate or edema. No effusion or pneumothorax. No acute osseous findings. Artifact from EKG leads. IMPRESSION: Stable exam.  No evidence of acute disease. Electronically Signed   By: Ronnette Coke M.D.   On: 05/27/2023 04:49    Procedures Procedures    Medications Ordered in ED Medications  albuterol  (PROVENTIL ) (2.5 MG/3ML) 0.083% nebulizer solution (10 mg/hr Nebulization Given 05/27/23 0455)  dexamethasone  (DECADRON ) injection 10 mg (10 mg Intravenous Given 05/27/23 0420)  acetaminophen  (TYLENOL ) tablet 1,000 mg (1,000 mg Oral Given 05/27/23 0427)  ibuprofen  (ADVIL ) tablet 800 mg (800 mg Oral Given 05/27/23 0427)    ED Course/ Medical Decision Making/ A&P                                 Medical Decision Making Amount and/or Complexity of Data  Reviewed Labs: ordered. Radiology: ordered.  Risk OTC drugs. Prescription drug management.   Differential Diagnosis considered includes, but not limited to: Asthma exacerbation; Bronchitis; Pneumonia; CHF; ACS; PE  Patient awakened with shortness of breath.  She has a history of asthma.  Patient  was hypoxic at home, brought to the ED on a breathing treatment.  She has received magnesium  and 2 DuoNeb's during transport with improvement.  She still had active wheezing on arrival.  She reports a history of prednisone but I do see that she has safely gotten Decadron  in the past.  She was given 10 mg of Decadron  and placed on a continuous albuterol  nebulizer treatment.  After this treatment was performed she reported significant improvement.  Repeat examination reveals no wheezing in the left, slight wheezing in the base of the right lung with good air movement and no hypoxia.  Will discharge with continued bronchodilator therapy as needed.  No signs of bacterial infection.        Final Clinical Impression(s) / ED Diagnoses Final diagnoses:  None    Rx / DC Orders ED Discharge Orders     None         Camry Robello, Marine Sia, MD 05/27/23 [phone]

## 2023-05-27 NOTE — ED Triage Notes (Signed)
 Pt BIB [facility] from home for c/o sob; pt states she was dozing off when she suddenly woke up in respiratory distress  When ems arrived pt had audible wheezing and was 85% on room air  Ems administered 2 duonebs and 2GM magnesium  with improvement. Initial O2 sat with ems 85% but after treatment pt increased to 100% on RA  Pt c/o dry cough and denies any recent exposures to any triggers for respiratory distress

## 2023-05-31 ENCOUNTER — Emergency Department (HOSPITAL_COMMUNITY)
Admission: EM | Admit: 2023-05-31 | Discharge: 2023-06-01 | Disposition: A | Discharge status: Home / Self Care | Attending: Emergency Medicine | Admitting: Emergency Medicine

## 2023-05-31 ENCOUNTER — Emergency Department (HOSPITAL_COMMUNITY)

## 2023-05-31 ENCOUNTER — Encounter (HOSPITAL_COMMUNITY): Payer: Self-pay | Admitting: *Deleted

## 2023-05-31 ENCOUNTER — Other Ambulatory Visit: Payer: Self-pay

## 2023-05-31 DIAGNOSIS — J45909 Unspecified asthma, uncomplicated: Secondary | ICD-10-CM | POA: Insufficient documentation

## 2023-05-31 DIAGNOSIS — R079 Chest pain, unspecified: Secondary | ICD-10-CM | POA: Diagnosis present

## 2023-05-31 DIAGNOSIS — R0602 Shortness of breath: Secondary | ICD-10-CM | POA: Insufficient documentation

## 2023-05-31 DIAGNOSIS — R058 Other specified cough: Secondary | ICD-10-CM | POA: Insufficient documentation

## 2023-05-31 DIAGNOSIS — E1165 Type 2 diabetes mellitus with hyperglycemia: Secondary | ICD-10-CM | POA: Diagnosis not present

## 2023-05-31 DIAGNOSIS — Z7984 Long term (current) use of oral hypoglycemic drugs: Secondary | ICD-10-CM | POA: Insufficient documentation

## 2023-05-31 LAB — CBC
HCT: 47.3 % — ABNORMAL HIGH (ref 36.0–46.0)
Hemoglobin: 16.2 g/dL — ABNORMAL HIGH (ref 12.0–15.0)
MCH: 26.7 pg (ref 26.0–34.0)
MCHC: 34.2 g/dL (ref 30.0–36.0)
MCV: 77.9 fL — ABNORMAL LOW (ref 80.0–100.0)
Platelets: 274 10*3/uL (ref 150–400)
RBC: 6.07 MIL/uL — ABNORMAL HIGH (ref 3.87–5.11)
RDW: 13.8 % (ref 11.5–15.5)
WBC: 9.9 10*3/uL (ref 4.0–10.5)
nRBC: 0 % (ref 0.0–0.2)

## 2023-05-31 LAB — BASIC METABOLIC PANEL WITH GFR
Anion gap: 13 (ref 5–15)
BUN: 15 mg/dL (ref 6–20)
CO2: 22 mmol/L (ref 22–32)
Calcium: 9.6 mg/dL (ref 8.9–10.3)
Chloride: 100 mmol/L (ref 98–111)
Creatinine, Ser: 0.72 mg/dL (ref 0.44–1.00)
GFR, Estimated: >60 mL/min (ref >60)
Glucose, Bld: 275 mg/dL — ABNORMAL HIGH (ref 70–99)
Potassium: 3.7 mmol/L (ref 3.5–5.1)
Sodium: 135 mmol/L (ref 135–145)

## 2023-05-31 LAB — D-DIMER, QUANTITATIVE: D-Dimer, Quant: 0.51 ug{FEU}/mL — ABNORMAL HIGH (ref 0.00–0.50)

## 2023-05-31 LAB — HCG, QUANTITATIVE, PREGNANCY: hCG, Beta Chain, Quant, S: 5 m[IU]/mL — ABNORMAL HIGH (ref <5)

## 2023-05-31 LAB — TROPONIN I (HIGH SENSITIVITY): Troponin I (High Sensitivity): <2 ng/L (ref <18)

## 2023-05-31 MED ORDER — LACTATED RINGERS IV BOLUS
1000.0000 mL | Freq: Once | INTRAVENOUS | Status: AC
  Administered 2023-05-31: 1000 mL via INTRAVENOUS

## 2023-05-31 MED ORDER — LIDOCAINE VISCOUS HCL 2 % MT SOLN
15.0000 mL | Freq: Once | OROMUCOSAL | Status: AC
  Administered 2023-05-31: 15 mL via ORAL
  Filled 2023-05-31: qty 15

## 2023-05-31 MED ORDER — IOHEXOL 350 MG/ML SOLN
75.0000 mL | Freq: Once | INTRAVENOUS | Status: AC | PRN
  Administered 2023-05-31: 75 mL via INTRAVENOUS

## 2023-05-31 MED ORDER — ALUM & MAG HYDROXIDE-SIMETH 200-200-20 MG/5ML PO SUSP
30.0000 mL | Freq: Once | ORAL | Status: AC
  Administered 2023-05-31: 30 mL via ORAL
  Filled 2023-05-31: qty 30

## 2023-05-31 NOTE — ED Notes (Signed)
 Pt states that her chest only hurts when she coughs

## 2023-05-31 NOTE — ED Triage Notes (Signed)
 Pt with c/o generalized chest pain. + SOB  +occ. Dry cough Pt with chronic asthma and has tried her inhaler.

## 2023-05-31 NOTE — ED Provider Notes (Signed)
  Provider Note MRN:  [id number]  Arrival date & time: 06/01/23    ED Course and Medical Decision Making  Assumed care of patient at sign-out or upon transfer.  Chest pain, shortness of breath, tachycardia, awaiting CT PE study.  1 AM update: On reassessment patient looks well, she says that her pain is resolved.  She is no longer tachycardic after fluids and medications.  Workup reassuring with negative troponin, PE study is normal.  Appropriate for discharge.  Procedures  Final Clinical Impressions(s) / ED Diagnoses     ICD-10-CM   1. Chest pain, unspecified type  R07.9       ED Discharge Orders          Ordered    omeprazole (PRILOSEC) 20 MG capsule  Daily        06/01/23 0101              Discharge Instructions      You were evaluated in the Emergency Department and after careful evaluation, we did not find any emergent condition requiring admission or further testing in the hospital.  Your exam/testing today is overall reassuring.  Symptoms may be due to acid reflux.  Recommend use of the omeprazole daily to prevent pain, follow-up with your primary care doctor.  Please return to the Emergency Department if you experience any worsening of your condition.   Thank you for allowing us  to be a part of your care.    Merrick Abe. Harless Lien, MD [facility] Emergency Medicine [facility] [email or web address]    Edson Graces, MD 06/01/23 [phone]

## 2023-05-31 NOTE — ED Provider Notes (Signed)
 [place] [employer] EMERGENCY DEPARTMENT AT [facility] Provider Note   CSN: [id number] Arrival date & time: 05/31/23  2115     History {Add pertinent medical, surgical, social history, OB history to HPI:1} Chief Complaint  Patient presents with   Chest Pain    Alyssa Becker is a 28 y.o. female with PMH as listed below who presents with  c/o generalized chest pain. + SOB. Reports intermittent chronic dry cough but not worse than normal lately. Pt with chronic asthma and has tried her inhaler, but hasn't had any wheezing and the inhaler didn't help. Nonradiating pain, was earlier 10/10. Similar to prior except no wheezing this time. No h/o DVT/PE, no leg swelling. No recent travel/hospitalizations/surgeries. No birth control. Has h/o cervical cancer s/p hysterectomy/radiation/chemotherapy with no active cancer.   Presented to ED on 05/27/23 for similar sxs, was hypoxic then with EMS, was given nebulizer and dexamethasone  with improvement and was DC'd.    Past Medical History:  Diagnosis Date   Anxiety    Asthma    Depression    History of chemotherapy    02-22-2021  to 03-22-2021  for cervical cancer   History of COVID-19 02/2020   followed by pcp   (04-03-2021  per pt checks blood sugar once weekly)   History of external beam radiation therapy    02-25-2021  to 03-29-2021 for cervical cancer   History of radiation therapy    cervix - tandem and ring 04/09/2021  Dr Retta Caster   Iron  deficiency anemia due to chronic blood loss    Malignant neoplasm cervix [facility])    oncologist--- dr gorsuch// radiation onologist-- dr Eloise Hake;   dx 01-16-2021;   01-24-2021  s/p robotic RSO and 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 Authorizing Provider  albuterol  (VENTOLIN  HFA) 108 (90 Base) MCG/ACT inhaler Inhale 2 puffs into the lungs every 6 (six) hours as needed for wheezing or shortness of breath. 05/17/22   Leath-Warren, Belen Bowers, NP  Ascorbic Acid (VITAMIN C) 500 MG CHEW 1 capsule in the evening with iron  tablets Orally Once a day for 90 days    [provider]  estradiol  (ESTRACE ) 1 MG tablet Take 1 tablet (1 mg total) by mouth daily. 03/13/23   Suzi Essex, MD  metFORMIN  (GLUCOPHAGE ) 500 MG tablet Take 1 tablet (500 mg total) by mouth 2 (two) times daily with a meal. 10/03/20   Othelia Blinks, MD  propranolol (INDERAL) 40 MG tablet Take 40 mg by mouth daily as needed (migraines).    [provider]  sertraline  (ZOLOFT ) 50 MG tablet Take 1 tablet (50 mg total) by mouth daily. 12/12/22   Suzi Essex, MD  prochlorperazine  (COMPAZINE ) 10 MG tablet Take 1 tablet (10 mg total) by mouth every 6 (six) hours as needed (Nausea or vomiting). Patient not taking: Reported on 02/18/2021 02/05/21 03/28/21  Almeda Jacobs, MD      Allergies    Pineapple and Prednisone    Review of Systems   Review of Systems A 10 point review of systems was performed and is negative unless otherwise reported in HPI.  Physical Exam Updated Vital Signs BP 129/85 (  BP Location: Right Arm)   Pulse (!) 119   Temp 98.6 F (37 C) (Oral)   Resp (!) 26   Ht 5\' 6"  (1.676 m)   Wt 132 kg   LMP 03/25/2021 (Approximate)   SpO2 97%   BMI 46.97 kg/m  Physical Exam General: Normal appearing {Desc; female/female:11659}, lying in bed.  HEENT: PERRLA, Sclera anicteric, MMM, trachea midline.  Cardiology: RRR, no murmurs/rubs/gallops. BL radial and DP pulses equal bilaterally.  Resp: Normal respiratory rate and effort. CTAB, no wheezes, rhonchi, crackles.  Abd: Soft, non-tender, non-distended. No rebound tenderness or guarding.  GU: Deferred. MSK: No peripheral edema or  signs of trauma. Extremities without deformity or TTP. No cyanosis or clubbing. Skin: warm, dry. No rashes or lesions. Back: No CVA tenderness Neuro: A&Ox4, CNs II-XII grossly intact. MAEs. Sensation grossly intact.  Psych: Normal mood and affect.   ED Results / Procedures / Treatments   Labs (all labs ordered are listed, but only abnormal results are displayed) Labs Reviewed  BASIC METABOLIC PANEL WITH GFR - Abnormal; Notable for the following components:      Result Value   Glucose, Bld 275 (*)    All other components within normal limits  CBC - Abnormal; Notable for the following components:   RBC 6.07 (*)    Hemoglobin 16.2 (*)    HCT 47.3 (*)    MCV 77.9 (*)    All other components within normal limits  D-DIMER, QUANTITATIVE  TROPONIN I (HIGH SENSITIVITY)    EKG EKG Interpretation Date/Time:  Sunday May 31 2023 21:26:49 EDT Ventricular Rate:  118 PR Interval:  136 QRS Duration:  88 QT Interval:  312 QTC Calculation: 437 R Axis:   82  Text Interpretation: Sinus tachycardia Otherwise normal ECG Confirmed by Annita Kindle [phone]) on 05/31/2023 9:33:41 PM  Radiology DG Chest 2 View Result Date: 05/31/2023 CLINICAL DATA:  Generalized chest pain and shortness of breath. EXAM: CHEST - 2 VIEW COMPARISON:  May 27, 2023 FINDINGS: The heart size and mediastinal contours are within normal limits. Low lung volumes are noted with stable mild to moderate severity elevation of the right hemidiaphragm. Both lungs are clear. The visualized skeletal structures are unremarkable. IMPRESSION: No active cardiopulmonary disease. Electronically Signed   By: Virgle Grime M.D.   On: 05/31/2023 22:33    Procedures Procedures  {Document cardiac monitor, telemetry assessment procedure when appropriate:1}  Medications Ordered in ED Medications  lactated ringers  bolus 1,000 mL (has no administration in time range)  alum & mag hydroxide-simeth (MAALOX/MYLANTA) 200-200-20 MG/5ML suspension 30  mL (has no administration in time range)    And  lidocaine  (XYLOCAINE ) 2 % viscous mouth solution 15 mL (has no administration in time range)    ED Course/ Medical Decision Making/ A&P                          Medical Decision Making Amount and/or Complexity of Data Reviewed Labs: ordered. Decision-making details documented in ED Course. Radiology: ordered. Decision-making details documented in ED Course.  Risk OTC drugs. Prescription drug management.    This patient presents to the ED for concern of ***, this involves an extensive number of treatment options, and is a complaint that carries with it a high risk of complications and morbidity.  I considered the following differential and admission for this acute, potentially life threatening condition.   MDM:    ***  Clinical Course as of 05/31/23 2246  Paulene Boron  May 31, 2023  2243 DG Chest 2 View No active cardiopulmonary disease. [HN]  2243 Basic metabolic panel(!) Hyperglycemia but otherwise unremarkable [HN]  2243 WBC: 9.9 No leukocytosis  [HN]  2243 Hemoglobin(!): 16.2 Similar to prior, will give some fluids in case component of volume contraction [HN]    Clinical Course User Index [HN] Merdis Stalling, MD    Labs: I Ordered, and personally interpreted labs.  The pertinent results include:  ***  Imaging Studies ordered: I ordered imaging studies including *** I independently visualized and interpreted imaging. I agree with the radiologist interpretation  Additional history obtained from ***.  External records from outside source obtained and reviewed including ***  Cardiac Monitoring: The patient was maintained on a cardiac monitor.  I personally viewed and interpreted the cardiac monitored which showed an underlying rhythm of: ***  Reevaluation: After the interventions noted above, I reevaluated the patient and found that they have :{resolved/improved/worsened:23923::"improved"}  Social Determinants of  Health: ***  Disposition:  ***  Co morbidities that complicate the patient evaluation  Past Medical History:  Diagnosis Date   Anxiety    Asthma    Depression    History of chemotherapy    02-22-2021  to 03-22-2021  for cervical cancer   History of COVID-19 02/2020   followed by pcp   (04-03-2021  per pt checks blood sugar once weekly)   History of external beam radiation therapy    02-25-2021  to 03-29-2021 for cervical cancer   History of radiation therapy    cervix - tandem and ring 04/09/2021  Dr Retta Caster   Iron  deficiency anemia due to chronic blood loss    Malignant neoplasm cervix [facility])    oncologist--- dr gorsuch// radiation onologist-- dr Eloise Hake;   dx 01-16-2021;   01-24-2021  s/p robotic RSO and left salpingectomy for right ovarian mass (endometrioid carcinoma);  completed chemo 03-22-2021 and IMRT 03-29-2021 (scheduled for high dose brachytherapy 04-09-2021)   Migraines    migraine   Morbid obesity (HCC)    Type 2 diabetes mellitus (HCC)      Medicines Meds ordered this encounter  Medications   lactated ringers  bolus 1,000 mL   AND Linked Order Group    alum & mag hydroxide-simeth (MAALOX/MYLANTA) 200-200-20 MG/5ML suspension 30 mL    lidocaine  (XYLOCAINE ) 2 % viscous mouth solution 15 mL    I have reviewed the patients home medicines and have made adjustments as needed  Problem List / ED Course: Problem List Items Addressed This Visit   None        {Document critical care time when appropriate:1} {Document review of labs and clinical decision tools ie heart score, Chads2Vasc2 etc:1}  {Document your independent review of radiology images, and any outside records:1} {Document your discussion with family members, caretakers, and with consultants:1} {Document social determinants of health affecting pt's care:1} {Document your decision making why or why not admission, treatments were needed:1}  This note was created using dictation software, which may  contain spelling or grammatical errors.

## 2023-06-01 MED ORDER — OMEPRAZOLE 20 MG PO CPDR: 20.0000 mg | DELAYED_RELEASE_CAPSULE | Freq: Every day | ORAL | 1 refills | Status: AC

## 2023-06-01 NOTE — Discharge Instructions (Signed)
 You were evaluated in the Emergency Department and after careful evaluation, we did not find any emergent condition requiring admission or further testing in the hospital.  Your exam/testing today is overall reassuring.  Symptoms may be due to acid reflux.  Recommend use of the omeprazole daily to prevent pain, follow-up with your primary care doctor.  Please return to the Emergency Department if you experience any worsening of your condition.   Thank you for allowing us  to be a part of your care.

## 2023-06-03 ENCOUNTER — Ambulatory Visit (HOSPITAL_COMMUNITY)
Admission: RE | Admit: 2023-06-03 | Discharge: 2023-06-03 | Disposition: A | Discharge status: Home / Self Care | Payer: Medicaid Other | Source: Ambulatory Visit | Attending: Gynecologic Oncology | Admitting: Gynecologic Oncology

## 2023-06-03 DIAGNOSIS — C539 Malignant neoplasm of cervix uteri, unspecified: Secondary | ICD-10-CM | POA: Diagnosis present

## 2023-06-03 MED ORDER — IOHEXOL 300 MG/ML  SOLN
100.0000 mL | Freq: Once | INTRAMUSCULAR | Status: AC | PRN
Start: 2023-06-03 — End: 2023-06-03
  Administered 2023-06-03: 100 mL via INTRAVENOUS

## 2023-06-03 MED ORDER — SODIUM CHLORIDE (PF) 0.9 % IJ SOLN
INTRAMUSCULAR | Status: AC
  Filled 2023-06-03: qty 50

## 2023-06-03 MED ORDER — IOHEXOL 9 MG/ML PO SOLN
1000.0000 mL | Freq: Once | ORAL | Status: AC
  Administered 2023-06-03: 1000 mL via ORAL

## 2023-06-03 MED ORDER — IOHEXOL 9 MG/ML PO SOLN
ORAL | Status: AC
  Filled 2023-06-03: qty 1000

## 2023-06-04 ENCOUNTER — Ambulatory Visit: Payer: Self-pay | Admitting: Gynecologic Oncology

## 2023-06-04 NOTE — Telephone Encounter (Signed)
-----   Message from Alyssa Becker sent at 06/04/2023  8:06 AM EDT ----- Please let patient know no findings concerning for recurrence on recent CT. Thank you!

## 2023-06-04 NOTE — Telephone Encounter (Signed)
 Attempted to reach patient to relay results. Left voicemail requesting call back to [phone].

## 2023-06-04 NOTE — Progress Notes (Signed)
 Please let patient know no findings concerning for recurrence on recent CT. Thank you!

## 2023-06-05 NOTE — Telephone Encounter (Signed)
-----   Message from Suzi Essex sent at 06/04/2023  8:06 AM EDT ----- Please let patient know no findings concerning for recurrence on recent CT. Thank you!

## 2023-06-05 NOTE — Telephone Encounter (Signed)
 2nd attempt to reach patient to relay results through [employer] id [phone] left voicemail requesting call back.

## 2023-06-08 NOTE — Telephone Encounter (Signed)
 3rd attempt to reach patient. Left voicemail requesting call back to [phone].

## 2023-06-08 NOTE — Telephone Encounter (Signed)
-----   Message from Suzi Essex sent at 06/04/2023  8:06 AM EDT ----- Please let patient know no findings concerning for recurrence on recent CT. Thank you!

## 2023-06-10 NOTE — Telephone Encounter (Signed)
-----   Message from Suzi Essex sent at 06/04/2023  8:06 AM EDT ----- Please let patient know no findings concerning for recurrence on recent CT. Thank you!

## 2023-06-10 NOTE — Telephone Encounter (Signed)
 4th attempt to reach patient through [employer] id # [phone] to relay results of CT scan and to remind patient of her follow up appt. With Dr. Orvil Bland on Thursday, May 29 th at 1:15 with arrival time of 1:00 for check in. Left voicemail requesting call back.

## 2023-06-17 ENCOUNTER — Telehealth: Payer: Self-pay

## 2023-06-17 NOTE — Telephone Encounter (Signed)
 Ms.Standage sent a MyChart message stating she was not feeling well and appointment for tomorrow with Dr.Tucker needs to be rescheduled.   Appointment rescheduled to 7/24 @ 2:00. Pt agreed to date/time

## 2023-06-18 ENCOUNTER — Inpatient Hospital Stay: Payer: Medicaid Other | Admitting: Gynecologic Oncology

## 2023-08-11 ENCOUNTER — Telehealth: Payer: Self-pay | Admitting: *Deleted

## 2023-08-11 NOTE — Telephone Encounter (Signed)
 Attempted to reach patient via [employer] id # [phone] for meaningful use call. Left voicemail to remind Alyssa Becker of her appt. With Dr. Viktoria on Thursday, July 24 th. At 2 pm, arrival time of 1:45 pm.

## 2023-08-12 ENCOUNTER — Telehealth: Payer: Self-pay | Admitting: *Deleted

## 2023-08-12 NOTE — Telephone Encounter (Signed)
 2nd attempt to reach patient through [employer] id [phone] for meaningful use call and reminder of follow up appointment with Dr. Viktoria tomorrow 7/24 at 2pm. LVM with reminder and request for call back to [phone].

## 2023-08-13 ENCOUNTER — Inpatient Hospital Stay: Attending: Gynecologic Oncology | Admitting: Gynecologic Oncology

## 2023-08-13 ENCOUNTER — Encounter: Payer: Self-pay | Admitting: Gynecologic Oncology

## 2023-08-13 VITALS — BP 139/78 | HR 100 | Temp 98.1°F | Resp 20 | Wt 285.0 lb

## 2023-08-13 DIAGNOSIS — Z90710 Acquired absence of both cervix and uterus: Secondary | ICD-10-CM | POA: Insufficient documentation

## 2023-08-13 DIAGNOSIS — Z6841 Body Mass Index (BMI) 40.0 and over, adult: Secondary | ICD-10-CM

## 2023-08-13 DIAGNOSIS — Z8541 Personal history of malignant neoplasm of cervix uteri: Secondary | ICD-10-CM | POA: Diagnosis present

## 2023-08-13 DIAGNOSIS — Z1151 Encounter for screening for human papillomavirus (HPV): Secondary | ICD-10-CM | POA: Insufficient documentation

## 2023-08-13 DIAGNOSIS — Z79890 Hormone replacement therapy (postmenopausal): Secondary | ICD-10-CM | POA: Diagnosis not present

## 2023-08-13 DIAGNOSIS — Z90721 Acquired absence of ovaries, unilateral: Secondary | ICD-10-CM | POA: Diagnosis not present

## 2023-08-13 DIAGNOSIS — Z9221 Personal history of antineoplastic chemotherapy: Secondary | ICD-10-CM | POA: Diagnosis not present

## 2023-08-13 DIAGNOSIS — C539 Malignant neoplasm of cervix uteri, unspecified: Secondary | ICD-10-CM | POA: Diagnosis not present

## 2023-08-13 DIAGNOSIS — Z923 Personal history of irradiation: Secondary | ICD-10-CM | POA: Diagnosis not present

## 2023-08-13 DIAGNOSIS — E8940 Asymptomatic postprocedural ovarian failure: Secondary | ICD-10-CM | POA: Diagnosis not present

## 2023-08-13 DIAGNOSIS — Z9079 Acquired absence of other genital organ(s): Secondary | ICD-10-CM | POA: Diagnosis not present

## 2023-08-13 MED ORDER — ESTRADIOL 1 MG PO TABS: 1.0000 mg | ORAL_TABLET | Freq: Every day | ORAL | 12 refills | Status: DC

## 2023-08-13 MED ORDER — ESTRADIOL 0.1 MG/24HR TD PTTW: 1.0000 | MEDICATED_PATCH | TRANSDERMAL | 12 refills | Status: AC

## 2023-08-13 NOTE — Patient Instructions (Signed)
 It was good to see you today.  I do not see or feel any evidence of cancer recurrence on your exam.  I will see you for follow-up in 3 months.  As always, if you develop any new and concerning symptoms before your next visit, please call to see me sooner.

## 2023-08-13 NOTE — Progress Notes (Signed)
 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 Large indeterminate right adnexal mass with solid hypervascular and cystic components. This appears separate from the base of the appendix on preceding CT and is likely arising from the right ovary based on relationship to the gonadal vessels on CT. Given the patient's young age, hypervascularity and leukocytosis, findings could represent an atypical tubo-ovarian abscess. However, the appearance is concerning for ovarian malignancy despite the patient's young age. Recommend prompt gynecology consultation. Pelvic MRI without and with contrast may be helpful for further evaluation. 2. The left ovary appears normal. Mild nonspecific thickening of the endometrium, within physiologic limits.   01/02/2021 Pathology Results   SPECIMEN ADEQUACY: Satisfactory for evaluation; transformation zone component PRESENT. INTERPRETATION: - Adenocarcinoma, NOS   01/15/2021 Imaging   MRI pelvis  13.0 cm cystic and solid mass arising from the right ovary, highly suspicious for ovarian carcinoma.   Mild endometrial thickening and 4.5 cm soft tissue mass within the endocervical canal. Differential diagnosis includes prolapsing endometrial polyp or carcinoma, or primary cervical carcinoma.   No evidence of pelvic metastatic disease.   01/16/2021 Initial Diagnosis   Malignant neoplasm of cervix (HCC)   01/22/2021 PET scan   1. Large cystic/necrotic right adnexal mass is markedly hypermetabolic and consistent with known neoplasm. 2. Extensive hypermetabolic tumor involving the endometrium and cervix. 3. Scattered borderline enlarged and mildly hypermetabolic retroperitoneal and external  iliac nodes. 4. No findings for metastatic disease involving the chest or bony structures.   01/24/2021 Surgery   Pre-operative Diagnosis: Complex adnexal mass, elevated CA-125, locally invasive cervical adenocarcinoma versus endometrial adenocarcinoma   Post-operative Diagnosis: same, high-grade carcinoma of the right ovary, suspected history of superinfection of the adnexal mass   Operation: Robotic-assisted laparoscopic bilateral salpingectomy, right oophorectomy, lysis of adhesions of approximately 45 minutes, right ureterolysis   Surgeon: Viktoria Crank MD    Operative Findings: On EUA, cervix enlarged and firm, fleshy tumor replacing the endocervix.  No definitive parametrial involvement appreciated.  Even gentle exam because significant bleeding so vagina was packed with lap sponges for the remainder of surgery.  On intra-abdominal exam, normal upper abdominal survey including omentum, liver edge, diaphragm, and stomach.  Normal small bowel.  Some physiologic adhesions of the sigmoid epiploica to the left sidewall.  Normal-appearing left ovary measuring approximately 3-4 cm.  Normal-appearing bilateral fallopian tubes.  Uterus approximately 8-10 cm and overall normal in appearance.  Right ovary replaced by a smooth but vascular 10 cm mass with multiple loculations.  Ovary with dense adhesions to the right pelvic sidewall.  Upon very careful manipulation of the mass for its removal, there was some drainage of fluid noted that was somewhat purulent.  This was sent for culture and Gram stain.  Upon contained mass removal with fractionation of the mass, tumor versus necrotic material was noted.  Frozen section consistent with high-grade carcinoma, unable to differentiate on frozen section whether this is the same cancer being seen on her cervical biopsies.  Right ureterolysis performed given oozing services from the broad ligament and peritoneum where adhesions were lysed between the ovary and  peritoneum, to assure ureter not in close proximity.  No obvious adenopathy.  No ascites.  Left ovary transposed out of the  pelvis along the left gutter, clips placed along the sidewall as well as the superior and inferior aspect of the ovary.     01/24/2021 Pathology Results   FINAL MICROSCOPIC DIAGNOSIS:   A. OVARY AND FALLOPIAN TUBE, RIGHT, SALPINGO OOPHORECTOMY:  - Endometrioid carcinoma, moderately differentiated, involving right ovary  - No evidence of ovarian surface involvement by carcinoma  - Segment of fallopian tube, negative for carcinoma  - See oncology table  - See comment   B. FALLOPIAN TUBE, LEFT, SALPINGECTOMY:  - Segment of fallopian tube, negative for carcinoma   A.  Immunostain for p16 shows patchy staining.  Immunostain for p53 shows a normal, wild-type expression pattern.  This immunoprofile is consistent with above interpretation.  Patient's previous cervical  biopsy was reviewed - it shows moderately differentiated carcinoma with a very similar histomorphology and very likely presents metastasis from the ovarian tumor to the cervix.    02/05/2021 Cancer Staging   Staging form: Cervix Uteri, AJCC Version 9 - Pathologic stage from 02/05/2021: FIGO Stage II (pT2, pN0, cM0) - Signed by Lonn Hicks, MD on 02/05/2021 Stage prefix: Initial diagnosis   02/14/2021 Procedure   Successful placement of a right internal jugular approach power injectable Port-A-Cath. The catheter is ready for immediate use   02/22/2021 - 03/22/2021 Chemotherapy   Patient is on Treatment Plan : Cervical cancer Cisplatin  q7d     02/25/2021 - 03/29/2021 Radiation Therapy   Radiation Treatment Dates: 02/25/2021 through 04/09/2021 Pelvic IMRT: 02/25/21 through 03/29/21 HDR-brachy: 04/09/21 Site Technique Total Dose (Gy) Dose per Fx (Gy) Completed Fx Beam Energies  Pelvis: Pelvis IMRT 45/45 1.8 25/25 6X  Cervix: Cervix_Bst_Fx1 HDR-brachy 5.5/5.5 5.5 1/1 Ir-192         05/13/2021 Imaging   1. Status post  bilateral oophorectomy. Previously seen large hypermetabolic right adnexal mass has been resected. Nodular soft tissue in the vicinity of both the left and right oophorectomy bed, likely postoperative in nature. Attention on follow-up. 2. Previously noted uterine/cervical mass is resolved, consistent with treatment response. 3. Prominent, previously FDG avid iliac and retroperitoneal lymph nodes are slightly diminished in size, consistent with treatment response. 4. Hypodense liver lesion of hepatic segment IVa measuring 1.5 x 1.2 cm, not previously appreciated by contrast enhanced CT or PET-CT, incompletely characterized although worrisome for hepatic metastasis. This could be further characterized by multiphasic contrast enhanced MRI. 5. Hepatomegaly and hepatic steatosis. 6. Splenomegaly.     05/21/2021 Surgery   Robotic-assisted modified radical hysterectomy with left oophorectomy, cystoscopy  Findings:  On EUA, some radiation changes noted with deviation of the cervix posteriorly. On intra-abdominal entry, normal upper abdominal survey. Normal omentum, small and large bowel. Surgically absent right adnexa. Left ovary pexed along sidewall above the pelvic brim (normal in appearance). Uterus 8-10 cm and somewhat bulbous. Significant retroperitoneal edema, some fibrosis. 2-3 cm tumor implant along the right parametrium and distal uterosacral ligament, free from the cervix itself. No obvious paracervical tumor extension. No gross adenopathy. No ascites.  On cystoscopy, bladder intact, good efflux noted from bilateral ureteral orifices.   05/21/2021 Pathology Results   A. UTERUS AND CERVIX, HYSTERECTOMY:  - Microscopic fragment in the upper endocervical canal suspicious for  treated adenocarcinoma associated with extensive treatment response.  - Fibrosis and cytologic atypia in the endometrium consistent with  treatment effects.  - See comment.   B. PERIMETRIUM, RIGHT, EXCISION  - Amorphous  material associated with histiocytic response.  - Connective tissue with inflammation, fibrosis and dystrophic  calcifications.  -  No malignancy identified.   C. OVARY, LEFT, OOPHORECTOMY:  - Benign ovary.  - No malignancy identified.   COMMENT:  There is a 1.7 cm polypoid mass involving the anterior endocervical canal which histologically shows dense fibrosis with scattered glands with cytologic atypia consistent with therapy effects.  This area has a microscopic, partially detached fragment with marked atypia and architectural changes suspicious for microscopic residual adenocarcinoma.  Elsewhere, the endometrium shows foci of similar fibrosis with patchy cytologic atypia consistent with therapy effects.  There are foci where fibrosis extends into the adjacent upper portions of the myometrium.  There is insufficient residual suspicious tissue present to resolve the location of the primary in this case.  The pattern of fibrotic apparent therapy affects is more suggestive of an ovarian metastasis from an endocervical or endometrial primary.    07/16/2021 PET scan   1. Interval hysterectomy with mild residual uptake in the surgical bed. This examination will serve as baseline for future comparison. No evidence of distant metastatic disease. 2. Relatively patchy/mottled uptake throughout the spine may be treatment related. No definite focal lesion. 3. Severely enlarged steatotic liver. 4. Splenomegaly.   10/28/2021 Procedure   Successful removal of Port-A-Cath without immediate post procedural complication.    Interval History: Doing well.  Denies any vaginal bleeding.  Reports baseline bowel and bladder function.  Denies any menopausal symptoms now that taking hormone replacement regularly.  Denies any abdominal or pelvic pain.  Past Medical/Surgical History: Past Medical History:  Diagnosis Date   Anxiety    Asthma    Depression    History of chemotherapy    02-22-2021  to 03-22-2021   for cervical cancer   History of COVID-19 02/2020   followed by pcp   (04-03-2021  per pt checks blood sugar once weekly)   History of external beam radiation therapy    02-25-2021  to 03-29-2021 for cervical cancer   History of radiation therapy    cervix - tandem and ring 04/09/2021  Dr Lynwood Nasuti   Iron  deficiency anemia due to chronic blood loss    Malignant neoplasm cervix [facility])    oncologist--- dr gorsuch// radiation onologist-- dr nasuti;   dx 01-16-2021;   01-24-2021  s/p robotic RSO and left salpingectomy for right ovarian mass (endometrioid carcinoma);  completed chemo 03-22-2021 and IMRT 03-29-2021 (scheduled for high dose brachytherapy 04-09-2021)   Migraines    migraine   Morbid obesity (HCC)    Type 2 diabetes mellitus (HCC)     Past Surgical History:  Procedure Laterality Date   IR IMAGING GUIDED PORT INSERTION  02/13/2021   IR REMOVAL TUN ACCESS W/ PORT W/O FL MOD SED  10/28/2021   OPERATIVE ULTRASOUND N/A 04/09/2021   Procedure: OPERATIVE ULTRASOUND;  Surgeon: Nasuti Lynwood, MD;  Location: [facility] [place];  Service: Urology;  Laterality: N/A;   ROBOTIC ASSISTED TOTAL HYSTERECTOMY N/A 05/21/2021   Procedure: XI ROBOTIC ASSISTED TOTAL HYSTERECTOMY;  Surgeon: Viktoria Comer SAUNDERS, MD;  Location: [facility];  Service: Gynecology;  Laterality: N/A;   TANDEM RING INSERTION N/A 04/09/2021   Procedure: TANDEM RING INSERTION;  Surgeon: Nasuti Lynwood, MD;  Location: [facility];  Service: Urology;  Laterality: N/A;   TONSILLECTOMY     child   XI ROBOTIC ASSISTED OOPHORECTOMY N/A 01/24/2021   Procedure: XI ROBOTIC ASSISTED RIGHT OOPHORECTOMY, OOPHOROPEXY OF LEFT OVARY;  Surgeon: Viktoria Comer SAUNDERS, MD;  Location: [facility];  Service: Gynecology;  Laterality: N/A;   XI ROBOTIC ASSISTED OOPHORECTOMY  Left 05/21/2021   Procedure: XI ROBOTIC ASSISTED LEFT OOPHORECTOMY;  Surgeon: Viktoria Comer SAUNDERS, MD;  Location: [facility];  Service: Gynecology;  Laterality: Left;   XI  ROBOTIC ASSISTED SALPINGECTOMY Bilateral 01/24/2021   Procedure: XI ROBOTIC ASSISTED SALPINGECTOMY; RIGHT URETERAL LYSIS; LYSIS OF ADHESIONS;  Surgeon: Viktoria Comer SAUNDERS, MD;  Location: [facility];  Service: Gynecology;  Laterality: Bilateral;    Family History  Problem Relation Age of Onset   Asthma Mother    Diabetes Mother    Hypertension Mother    Breast cancer Mother    Asthma Father    Diabetes Father    Kidney disease Father    Diabetes Sister    Asthma Sister    Asthma Brother    Diabetes Brother    Cancer Maternal Aunt    Diabetes Maternal Uncle    Diabetes Maternal Grandmother    Hypertension Maternal Grandmother    Anemia Maternal Grandmother    Heart attack Paternal Grandfather    Cancer Paternal Grandfather    Colon cancer Neg Hx    Ovarian cancer Neg Hx    Endometrial cancer Neg Hx    Pancreatic cancer Neg Hx    Prostate cancer Neg Hx     Social History   Socioeconomic History   Marital status: Single    Spouse name: Not on file   Number of children: Not on file   Years of education: Not on file   Highest education level: Not on file  Occupational History   Not on file  Tobacco Use   Smoking status: Never   Smokeless tobacco: Never  Vaping Use   Vaping status: Never Used  Substance and Sexual Activity   Alcohol use: Never   Drug use: Never   Sexual activity: Not Currently    Birth control/protection: None    Comment: female partner  Other Topics Concern   Not on file  Social History Narrative   Not on file   Social Drivers of Health   Financial Resource Strain: Not on file  Food Insecurity: Not on file  Transportation Needs: Not on file  Physical Activity: Not on file  Stress: Not on file  Social Connections: Not on file    Current Medications:  Current Outpatient Medications:    estradiol  (VIVELLE -DOT) 0.1 MG/24HR patch, Place 1 patch (0.1 mg total) onto the skin 2 (two) times a week., Disp: 8 patch, Rfl: 12   albuterol  (VENTOLIN   HFA) 108 (90 Base) MCG/ACT inhaler, Inhale 2 puffs into the lungs every 6 (six) hours as needed for wheezing or shortness of breath., Disp: 8 g, Rfl: 0   Ascorbic Acid (VITAMIN C) 500 MG CHEW, 1 capsule in the evening with iron  tablets Orally Once a day for 90 days, Disp: , Rfl:    metFORMIN  (GLUCOPHAGE ) 500 MG tablet, Take 1 tablet (500 mg total) by mouth 2 (two) times daily with a meal., Disp: 60 tablet, Rfl: 1   omeprazole  (PRILOSEC) 20 MG capsule, Take 1 capsule (20 mg total) by mouth daily., Disp: 30 capsule, Rfl: 1   propranolol (INDERAL) 40 MG tablet, Take 40 mg by mouth daily as needed (migraines)., Disp: , Rfl:    sertraline  (ZOLOFT ) 50 MG tablet, Take 1 tablet (50 mg total) by mouth daily., Disp: 30 tablet, Rfl: 6  Review of Systems: + headache Denies appetite changes, fevers, chills, fatigue, unexplained weight changes. Denies hearing loss, neck lumps or masses, mouth sores, ringing in ears or voice changes. Denies cough or  wheezing.  Denies shortness of breath. Denies chest pain or palpitations. Denies leg swelling. Denies abdominal distention, pain, blood in stools, constipation, diarrhea, nausea, vomiting, or early satiety. Denies pain with intercourse, dysuria, frequency, hematuria or incontinence. Denies hot flashes, pelvic pain, vaginal bleeding or vaginal discharge.   Denies joint pain, back pain or muscle pain/cramps. Denies itching, rash, or wounds. Denies dizziness, numbness or seizures. Denies swollen lymph nodes or glands, denies easy bruising or bleeding. Denies anxiety, depression, confusion, or decreased concentration.  Physical Exam: BP 139/78 (BP Location: Right Arm, Patient Position: Sitting)   Pulse 100   Temp 98.1 F (36.7 C) (Oral)   Resp 20   Wt 285 lb (129.3 kg)   LMP 03/25/2021 (Approximate)   SpO2 100%   BMI 46.00 kg/m  General: Alert, oriented, no acute distress. HEENT: Normocephalic, atraumatic, sclera anicteric. Chest: Clear to auscultation  bilaterally.  No wheezes or rhonchi. Cardiovascular: Heart rate in 90s, regular rhythm, no murmurs. Abdomen: Obese, soft, nontender.  Normoactive bowel sounds.  No masses or hepatosplenomegaly appreciated.  Well-healed incisions. Extremities: Grossly normal range of motion.  Warm, well perfused.  No edema bilaterally. Skin: No rashes or lesions noted. GU: Normal appearing external genitalia without erythema, excoriation, or lesions.  Speculum exam reveals minimally atrophic vaginal mucosa with radiation changes present.  Cuff intact, no lesions visible.  Bimanual exam reveals cuff intact, no masses or nodularity.    Laboratory & Radiologic Studies: 05/2023: CT A/P Status post hysterectomy with no evidence for malignancy within the abdomen or pelvis. Hepatic steatosis with persistent nonspecific hepatosplenomegaly.  Assessment & Plan: Alyssa Becker is a 28 y.o. 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  PDL1 10%. Last pap 05/2022 - NIML, HR HPV negative.   Patient is overall doing well and is NED on exam. HPV and pap performed today.   Most recent imaging is without evidence of metastatic disease.   She is doing much better from a menopausal standpoint now that she is wearing her patch all the time.  Refill sent to her pharmacy.  Overall mood is significantly better as well.   Per NCCN surveillance recommendations, we will continue with visits every 3 months.  These will include a pelvic exam.  Pap test and HPV testing will performed yearly.   22 minutes of total time was spent for this patient encounter, including preparation, face-to-face counseling with the patient and coordination of care, and documentation of the encounter.  Comer Dollar, MD  Division of Gynecologic Oncology  Department of  Obstetrics and Gynecology  [facility]

## 2023-08-27 ENCOUNTER — Ambulatory Visit: Payer: Self-pay | Admitting: Gynecologic Oncology

## 2023-08-27 LAB — CYTOLOGY - PAP
Comment: NEGATIVE
Diagnosis: UNDETERMINED — AB
High risk HPV: NEGATIVE

## 2023-10-28 ENCOUNTER — Other Ambulatory Visit: Payer: Self-pay

## 2023-10-28 ENCOUNTER — Encounter (HOSPITAL_COMMUNITY): Payer: Self-pay

## 2023-10-28 ENCOUNTER — Emergency Department (HOSPITAL_COMMUNITY)

## 2023-10-28 ENCOUNTER — Emergency Department (HOSPITAL_COMMUNITY)
Admission: EM | Admit: 2023-10-28 | Discharge: 2023-10-29 | Disposition: A | Discharge status: Home / Self Care | Attending: Emergency Medicine | Admitting: Emergency Medicine

## 2023-10-28 DIAGNOSIS — W19XXXA Unspecified fall, initial encounter: Secondary | ICD-10-CM | POA: Insufficient documentation

## 2023-10-28 DIAGNOSIS — M25561 Pain in right knee: Secondary | ICD-10-CM | POA: Insufficient documentation

## 2023-10-28 NOTE — ED Triage Notes (Signed)
 Pt reports she fell on her right knee about 3 weeks ago and it still in sore.

## 2023-10-29 MED ORDER — NAPROXEN 500 MG PO TABS: 500.0000 mg | ORAL_TABLET | Freq: Two times a day (BID) | ORAL | 0 refills | Status: DC

## 2023-10-29 MED ORDER — NAPROXEN 250 MG PO TABS
500.0000 mg | ORAL_TABLET | Freq: Once | ORAL | Status: AC
  Administered 2023-10-29: 500 mg via ORAL
  Filled 2023-10-29: qty 2

## 2023-10-29 NOTE — ED Notes (Signed)
 Unable to apply knee  brace. ACE wrap applied. Went over [employer]. All questions answered.  Wheeled out to lobby by family.

## 2023-10-29 NOTE — ED Provider Notes (Signed)
 [place] EMERGENCY DEPARTMENT AT [facility] [place]  Provider Note  CSN: [id number] Arrival date & time: 10/28/23 2222  History Chief Complaint  Patient presents with   Knee Injury    Alyssa Becker is a 28 y.o. female reports she fell and injured her R knee about 3 weeks ago. Has not seen anyone about it yet. She reports she has '20 knee braces from prior knee problems but none of them fit right'. She has been taking OTC analgesia without improvement.    Home Medications Prior to Admission medications   Medication Sig Start Date End Date Taking? Authorizing Provider  naproxen (NAPROSYN) 500 MG tablet Take 1 tablet (500 mg total) by mouth 2 (two) times daily. 10/29/23  Yes Roselyn Carlin NOVAK, MD  albuterol  (VENTOLIN  HFA) 108 [id number] Base) MCG/ACT inhaler Inhale 2 puffs into the lungs every 6 (six) hours as needed for wheezing or shortness of breath. 05/17/22   Leath-Warren, Etta PARAS, NP  Ascorbic Acid (VITAMIN C) 500 MG CHEW 1 capsule in the evening with iron  tablets Orally Once a day for 90 days    [provider]  estradiol  (VIVELLE -DOT) 0.1 MG/24HR patch Place 1 patch (0.1 mg total) onto the skin 2 (two) times a week. 08/13/23   Viktoria Comer SAUNDERS, MD  metFORMIN  (GLUCOPHAGE ) 500 MG tablet Take 1 tablet (500 mg total) by mouth 2 (two) times daily with a meal. 10/03/20   Lorence Ozell CROME, MD  omeprazole  (PRILOSEC) 20 MG capsule Take 1 capsule (20 mg total) by mouth daily. 06/01/23   Theadore Ozell HERO, MD  propranolol (INDERAL) 40 MG tablet Take 40 mg by mouth daily as needed (migraines).    [provider]  sertraline  (ZOLOFT ) 50 MG tablet Take 1 tablet (50 mg total) by mouth daily. 12/12/22   Viktoria Comer SAUNDERS, MD  prochlorperazine  (COMPAZINE ) 10 MG tablet Take 1 tablet (10 mg total) by mouth every 6 (six) hours as needed (Nausea or vomiting). Patient not taking: Reported on 02/18/2021 02/05/21 03/28/21  Lonn Hicks, MD     Allergies    Pineapple and  Prednisone   Review of Systems   Review of Systems Please see HPI for pertinent positives and negatives  Physical Exam BP (!) 159/94 (BP Location: Right Arm)   Pulse 95   Temp 98.2 F (36.8 C) (Oral)   Resp 18   Wt 132.5 kg   LMP 03/25/2021 (Approximate)   SpO2 99%   BMI 47.13 kg/m   Physical Exam Vitals and nursing note reviewed.  HENT:     Head: Normocephalic.     Nose: Nose normal.  Eyes:     Extraocular Movements: Extraocular movements intact.  Pulmonary:     Effort: Pulmonary effort is normal.  Musculoskeletal:        General: Tenderness present. No swelling.     Cervical back: Neck supple.     Comments: Decreased ROM due to pain, no laxity, no deformity  Skin:    Findings: No rash (on exposed skin).  Neurological:     Mental Status: She is alert and oriented to person, place, and time.  Psychiatric:        Mood and Affect: Mood normal.     ED Results / Procedures / Treatments   EKG None  Procedures Procedures  Medications Ordered in the ED Medications  naproxen (NAPROSYN) tablet 500 mg (has no administration in time range)    Initial Impression and Plan  Patient here with knee pain, some  chronic component per patient but a recent injury has made pain worse. I personally viewed the images from radiology studies and agree with radiologist interpretation: Xray is neg, will give a hinged knee brace. Rx for Naprosyn, Ortho followup  ED Course       MDM Rules/Calculators/A&P Medical Decision Making Problems Addressed: Acute pain of right knee: acute illness or injury  Amount and/or Complexity of Data Reviewed Radiology: ordered and independent interpretation performed. Decision-making details documented in ED Course.  Risk Prescription drug management.     Final Clinical Impression(s) / ED Diagnoses Final diagnoses:  Acute pain of right knee    Rx / DC Orders ED Discharge Orders          Ordered    naproxen (NAPROSYN) 500 MG tablet   2 times daily        10/29/23 0105             Roselyn Carlin NOVAK, MD 10/29/23 [phone]

## 2023-11-16 ENCOUNTER — Ambulatory Visit: Admitting: Orthopedic Surgery

## 2023-11-16 ENCOUNTER — Encounter: Payer: Self-pay | Admitting: Orthopedic Surgery

## 2023-11-16 VITALS — BP 122/86 | HR 104 | Ht 66.0 in | Wt 292.0 lb

## 2023-11-16 DIAGNOSIS — M25561 Pain in right knee: Secondary | ICD-10-CM

## 2023-11-16 DIAGNOSIS — T148XXA Other injury of unspecified body region, initial encounter: Secondary | ICD-10-CM | POA: Diagnosis not present

## 2023-11-16 MED ORDER — SULINDAC 150 MG PO TABS
150.0000 mg | ORAL_TABLET | Freq: Two times a day (BID) | ORAL | 5 refills | Status: AC
Start: 2023-11-16 — End: ?

## 2023-11-16 NOTE — Progress Notes (Signed)
  Intake history:  Chief Complaint  Patient presents with   Knee Pain    Right      BP 122/86   Pulse (!) 104   Ht 5' 6 (1.676 m)   Wt 292 lb (132.5 kg)   LMP 03/25/2021 (Approximate)   BMI 47.13 kg/m  Body mass index is 47.13 kg/m.  Pharmacy? ______[facility]__________  WHAT ARE WE SEEING YOU FOR TODAY?   Right knee   How long has this bothered you? (DOI?DOS?WS?)  3 weeks ago  Was there an injury? Yes fell on concrete   Anticoag.  No  Diabetes Yes  Heart disease No  Hypertension No  SMOKING HX No  Kidney disease No  Any ALLERGIES ___________ Allergies  Allergen Reactions   Pineapple Anaphylaxis and Swelling    Throat swells   Prednisone Other (See Comments)    Bloating   ___________________________________   Treatment:  Have you taken:  Tylenol  No doesn't work for me  Advil  No doesn't work for me  Had PT No  Had injection No  Other  _________________________

## 2023-11-16 NOTE — Progress Notes (Signed)
 Emergency room follow-up visit  New patient to the practice  Chief Complaint  Patient presents with   Knee Pain    Right     This is a 28 year old female who landed directly on her right knee about 3 weeks ago.  She went to the emergency room and x-rays were negative for any fracture.  She still has pain in the anterior portion of her knee which is getting better slightly although she tried Tylenol  and ibuprofen  and did not not get much relief from that.  She does say the knee is getting better  Review of systems is negative for mechanical symptoms  Past Medical History:  Diagnosis Date   Anxiety    Asthma    Depression    History of chemotherapy    02-22-2021  to 03-22-2021  for cervical cancer   History of COVID-19 02/2020   followed by pcp   (04-03-2021  per pt checks blood sugar once weekly)   History of external beam radiation therapy    02-25-2021  to 03-29-2021 for cervical cancer   History of radiation therapy    cervix - tandem and ring 04/09/2021  Dr Lynwood Nasuti   Iron  deficiency anemia due to chronic blood loss    Malignant neoplasm cervix [facility])    oncologist--- dr gorsuch// radiation onologist-- dr nasuti;   dx 01-16-2021;   01-24-2021  s/p robotic RSO and left salpingectomy for right ovarian mass (endometrioid carcinoma);  completed chemo 03-22-2021 and IMRT 03-29-2021 (scheduled for high dose brachytherapy 04-09-2021)   Migraines    migraine   Morbid obesity (HCC)    Type 2 diabetes mellitus (HCC)     Allergies  Allergen Reactions   Pineapple Anaphylaxis and Swelling    Throat swells   Prednisone Other (See Comments)    Bloating    BP 122/86   Pulse (!) 104   Ht 5' 6 (1.676 m)   Wt 292 lb (132.5 kg)   LMP 03/25/2021 (Approximate)   BMI 47.13 kg/m   She is awake and alert she is oriented x 3  BMI noted  Grooming hygiene normal  Mood affect normal  Right knee full range of motion Tenderness over the patella and patellar tendon Stable  anterior posterior drawer and collateral ligaments Straight leg raise intact  No hip pain with flexion of the hip  Outside imaging read by another physician  Interpretation of the 4 views of the right knee is that there is no fracture or dislocation  Impression Encounter Diagnosis  Name Primary?   Contusion of bone Yes    Plan  Patient education to the natural history of bone contusion 6 to 8-week process for this to heal Medicaid allows 5 anti-inflammatories she has tried ibuprofen  we will try sulindac  Meds ordered this encounter  Medications   sulindac (CLINORIL) 150 MG tablet    Sig: Take 1 tablet (150 mg total) by mouth 2 (two) times daily.    Dispense:  60 tablet    Refill:  5

## 2023-11-19 ENCOUNTER — Inpatient Hospital Stay: Admitting: Gynecologic Oncology

## 2023-11-19 ENCOUNTER — Telehealth: Payer: Self-pay | Admitting: *Deleted

## 2023-11-19 DIAGNOSIS — C539 Malignant neoplasm of cervix uteri, unspecified: Secondary | ICD-10-CM

## 2023-11-19 NOTE — Telephone Encounter (Signed)
 Patient called and reschedule her appt from today to 11/5. Patient not feeling well

## 2023-11-25 ENCOUNTER — Inpatient Hospital Stay: Attending: Gynecologic Oncology | Admitting: Gynecologic Oncology

## 2023-11-25 VITALS — BP 135/62 | HR 80 | Temp 98.8°F | Resp 16 | Ht 66.0 in | Wt 283.0 lb

## 2023-11-25 DIAGNOSIS — Z9221 Personal history of antineoplastic chemotherapy: Secondary | ICD-10-CM | POA: Insufficient documentation

## 2023-11-25 DIAGNOSIS — Z8541 Personal history of malignant neoplasm of cervix uteri: Secondary | ICD-10-CM | POA: Diagnosis present

## 2023-11-25 DIAGNOSIS — Z923 Personal history of irradiation: Secondary | ICD-10-CM | POA: Insufficient documentation

## 2023-11-25 DIAGNOSIS — Z08 Encounter for follow-up examination after completed treatment for malignant neoplasm: Secondary | ICD-10-CM | POA: Insufficient documentation

## 2023-11-25 DIAGNOSIS — Z90710 Acquired absence of both cervix and uterus: Secondary | ICD-10-CM | POA: Diagnosis not present

## 2023-11-25 DIAGNOSIS — Z90722 Acquired absence of ovaries, bilateral: Secondary | ICD-10-CM | POA: Insufficient documentation

## 2023-11-25 DIAGNOSIS — C539 Malignant neoplasm of cervix uteri, unspecified: Secondary | ICD-10-CM

## 2023-11-25 DIAGNOSIS — Z9079 Acquired absence of other genital organ(s): Secondary | ICD-10-CM | POA: Diagnosis not present

## 2023-11-25 NOTE — Patient Instructions (Addendum)
 It was good to see you today.  I do not see or feel any evidence of cancer recurrence on your exam.   We will see you for follow-up in 3 months or sooner if needed.   As always, if you develop any new and concerning symptoms before your next visit, please call to see me sooner.  Please let us  know or your PCP if the area on your left inner thigh becomes more red, more painful, more swollen. It looks like it is healing currently. You can always apply a warm compress in this area as well to promote drainage.  Symptoms to report to your health care team include vaginal bleeding, rectal bleeding, bloating, weight loss without effort, new and persistent pain, new and  persistent fatigue, new leg swelling, new masses (i.e., bumps in your neck or groin), new and persistent cough, new and persistent nausea and vomiting, change in bowel or bladder habits, and any other concerns.

## 2023-11-25 NOTE — Progress Notes (Signed)
 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 Large indeterminate right adnexal mass with solid hypervascular and cystic components. This appears separate from the base of the appendix on preceding CT and is likely arising from the right ovary based on relationship to the gonadal vessels on CT. Given the patient's young age, hypervascularity and leukocytosis, findings could represent an atypical tubo-ovarian abscess. However, the appearance is concerning for ovarian malignancy despite the patient's young age. Recommend prompt gynecology consultation. Pelvic MRI without and with contrast may be helpful for further evaluation. 2. The left ovary appears normal. Mild nonspecific thickening of the endometrium, within physiologic limits.   01/02/2021 Pathology Results   SPECIMEN ADEQUACY: Satisfactory for evaluation; transformation zone component PRESENT. INTERPRETATION: - Adenocarcinoma, NOS   01/15/2021 Imaging   MRI pelvis  13.0 cm cystic and solid mass arising from the right ovary, highly suspicious for ovarian carcinoma.   Mild endometrial thickening and 4.5 cm soft tissue mass within the endocervical canal. Differential diagnosis includes prolapsing endometrial polyp or carcinoma, or primary cervical carcinoma.   No evidence of pelvic metastatic disease.   01/16/2021 Initial Diagnosis   Malignant neoplasm of cervix (HCC)   01/22/2021 PET scan   1. Large cystic/necrotic right adnexal mass is markedly hypermetabolic and consistent with known neoplasm. 2. Extensive hypermetabolic tumor involving the endometrium and cervix. 3. Scattered borderline enlarged and mildly hypermetabolic retroperitoneal and external  iliac nodes. 4. No findings for metastatic disease involving the chest or bony structures.   01/24/2021 Surgery   Pre-operative Diagnosis: Complex adnexal mass, elevated CA-125, locally invasive cervical adenocarcinoma versus endometrial adenocarcinoma   Post-operative Diagnosis: same, high-grade carcinoma of the right ovary, suspected history of superinfection of the adnexal mass   Operation: Robotic-assisted laparoscopic bilateral salpingectomy, right oophorectomy, lysis of adhesions of approximately 45 minutes, right ureterolysis   Surgeon: Viktoria Crank MD    Operative Findings: On EUA, cervix enlarged and firm, fleshy tumor replacing the endocervix.  No definitive parametrial involvement appreciated.  Even gentle exam because significant bleeding so vagina was packed with lap sponges for the remainder of surgery.  On intra-abdominal exam, normal upper abdominal survey including omentum, liver edge, diaphragm, and stomach.  Normal small bowel.  Some physiologic adhesions of the sigmoid epiploica to the left sidewall.  Normal-appearing left ovary measuring approximately 3-4 cm.  Normal-appearing bilateral fallopian tubes.  Uterus approximately 8-10 cm and overall normal in appearance.  Right ovary replaced by a smooth but vascular 10 cm mass with multiple loculations.  Ovary with dense adhesions to the right pelvic sidewall.  Upon very careful manipulation of the mass for its removal, there was some drainage of fluid noted that was somewhat purulent.  This was sent for culture and Gram stain.  Upon contained mass removal with fractionation of the mass, tumor versus necrotic material was noted.  Frozen section consistent with high-grade carcinoma, unable to differentiate on frozen section whether this is the same cancer being seen on her cervical biopsies.  Right ureterolysis performed given oozing services from the broad ligament and peritoneum where adhesions were lysed between the ovary and  peritoneum, to assure ureter not in close proximity.  No obvious adenopathy.  No ascites.  Left ovary transposed out of the  pelvis along the left gutter, clips placed along the sidewall as well as the superior and inferior aspect of the ovary.     01/24/2021 Pathology Results   FINAL MICROSCOPIC DIAGNOSIS:   A. OVARY AND FALLOPIAN TUBE, RIGHT, SALPINGO OOPHORECTOMY:  - Endometrioid carcinoma, moderately differentiated, involving right ovary  - No evidence of ovarian surface involvement by carcinoma  - Segment of fallopian tube, negative for carcinoma  - See oncology table  - See comment   B. FALLOPIAN TUBE, LEFT, SALPINGECTOMY:  - Segment of fallopian tube, negative for carcinoma   A.  Immunostain for p16 shows patchy staining.  Immunostain for p53 shows a normal, wild-type expression pattern.  This immunoprofile is consistent with above interpretation.  Patient's previous cervical  biopsy was reviewed - it shows moderately differentiated carcinoma with a very similar histomorphology and very likely presents metastasis from the ovarian tumor to the cervix.    02/05/2021 Cancer Staging   Staging form: Cervix Uteri, AJCC Version 9 - Pathologic stage from 02/05/2021: FIGO Stage II (pT2, pN0, cM0) - Signed by Lonn Hicks, MD on 02/05/2021 Stage prefix: Initial diagnosis   02/14/2021 Procedure   Successful placement of a right internal jugular approach power injectable Port-A-Cath. The catheter is ready for immediate use   02/22/2021 - 03/22/2021 Chemotherapy   Patient is on Treatment Plan : Cervical cancer Cisplatin  q7d     02/25/2021 - 03/29/2021 Radiation Therapy   Radiation Treatment Dates: 02/25/2021 through 04/09/2021 Pelvic IMRT: 02/25/21 through 03/29/21 HDR-brachy: 04/09/21 Site Technique Total Dose (Gy) Dose per Fx (Gy) Completed Fx Beam Energies  Pelvis: Pelvis IMRT 45/45 1.8 25/25 6X  Cervix: Cervix_Bst_Fx1 HDR-brachy 5.5/5.5 5.5 1/1 Ir-192         05/13/2021 Imaging   1. Status post  bilateral oophorectomy. Previously seen large hypermetabolic right adnexal mass has been resected. Nodular soft tissue in the vicinity of both the left and right oophorectomy bed, likely postoperative in nature. Attention on follow-up. 2. Previously noted uterine/cervical mass is resolved, consistent with treatment response. 3. Prominent, previously FDG avid iliac and retroperitoneal lymph nodes are slightly diminished in size, consistent with treatment response. 4. Hypodense liver lesion of hepatic segment IVa measuring 1.5 x 1.2 cm, not previously appreciated by contrast enhanced CT or PET-CT, incompletely characterized although worrisome for hepatic metastasis. This could be further characterized by multiphasic contrast enhanced MRI. 5. Hepatomegaly and hepatic steatosis. 6. Splenomegaly.     05/21/2021 Surgery   Robotic-assisted modified radical hysterectomy with left oophorectomy, cystoscopy  Findings:  On EUA, some radiation changes noted with deviation of the cervix posteriorly. On intra-abdominal entry, normal upper abdominal survey. Normal omentum, small and large bowel. Surgically absent right adnexa. Left ovary pexed along sidewall above the pelvic brim (normal in appearance). Uterus 8-10 cm and somewhat bulbous. Significant retroperitoneal edema, some fibrosis. 2-3 cm tumor implant along the right parametrium and distal uterosacral ligament, free from the cervix itself. No obvious paracervical tumor extension. No gross adenopathy. No ascites.  On cystoscopy, bladder intact, good efflux noted from bilateral ureteral orifices.   05/21/2021 Pathology Results   A. UTERUS AND CERVIX, HYSTERECTOMY:  - Microscopic fragment in the upper endocervical canal suspicious for  treated adenocarcinoma associated with extensive treatment response.  - Fibrosis and cytologic atypia in the endometrium consistent with  treatment effects.  - See comment.   B. PERIMETRIUM, RIGHT, EXCISION  - Amorphous  material associated with histiocytic response.  - Connective tissue with inflammation, fibrosis and dystrophic  calcifications.  -  No malignancy identified.   C. OVARY, LEFT, OOPHORECTOMY:  - Benign ovary.  - No malignancy identified.   COMMENT:  There is a 1.7 cm polypoid mass involving the anterior endocervical canal which histologically shows dense fibrosis with scattered glands with cytologic atypia consistent with therapy effects.  This area has a microscopic, partially detached fragment with marked atypia and architectural changes suspicious for microscopic residual adenocarcinoma.  Elsewhere, the endometrium shows foci of similar fibrosis with patchy cytologic atypia consistent with therapy effects.  There are foci where fibrosis extends into the adjacent upper portions of the myometrium.  There is insufficient residual suspicious tissue present to resolve the location of the primary in this case.  The pattern of fibrotic apparent therapy affects is more suggestive of an ovarian metastasis from an endocervical or endometrial primary.    07/16/2021 PET scan   1. Interval hysterectomy with mild residual uptake in the surgical bed. This examination will serve as baseline for future comparison. No evidence of distant metastatic disease. 2. Relatively patchy/mottled uptake throughout the spine may be treatment related. No definite focal lesion. 3. Severely enlarged steatotic liver. 4. Splenomegaly.   10/28/2021 Procedure   Successful removal of Port-A-Cath without immediate post procedural complication.   CT AP 06/03/2023. Last pap on 08/13/2023.  Interval History: Patient presents today to the office for continued follow-up.  She reports doing well since her last visit.  She went for a trip to [place]  over the summer for 2 weeks and had a nice time.  She has lost weight and is being mindful of her dietary choices.  She continues to tolerate her diet with no nausea or emesis.  She does report  early satiety on this current diet.  This started after her dietary changes.  No abdominal bloating, abdominal, or pelvic pain.  Bladder is functioning without difficulty without dysuria or hematuria.  No vaginal bleeding or discharge reported.  Bowels are functioning at baseline.  No lower extremity edema reported.  She does report a recent fall landing on concrete on her right knee.  She has seen a provider and had imaging.  She was instructed to limit her activity.  She is tolerating the current estrogen patch dose well with minimal symptoms and she is sleeping well.  Past Medical/Surgical History: Past Medical History:  Diagnosis Date   Anxiety    Asthma    Depression    History of chemotherapy    02-22-2021  to 03-22-2021  for cervical cancer   History of COVID-19 02/2020   followed by pcp   (04-03-2021  per pt checks blood sugar once weekly)   History of external beam radiation therapy    02-25-2021  to 03-29-2021 for cervical cancer   History of radiation therapy    cervix - tandem and ring 04/09/2021  Dr Lynwood Nasuti   Iron  deficiency anemia due to chronic blood loss    Malignant neoplasm cervix [facility])    oncologist--- dr gorsuch// radiation onologist-- dr nasuti;   dx 01-16-2021;   01-24-2021  s/p robotic RSO and left salpingectomy for right ovarian mass (endometrioid carcinoma);  completed chemo 03-22-2021 and IMRT 03-29-2021 (scheduled for high dose brachytherapy 04-09-2021)   Migraines    migraine   Morbid obesity (HCC)    Type 2 diabetes mellitus (HCC)     Past Surgical History:  Procedure Laterality Date   IR IMAGING GUIDED PORT INSERTION  02/13/2021   IR REMOVAL TUN ACCESS W/ PORT W/O FL MOD SED  10/28/2021   OPERATIVE ULTRASOUND N/A 04/09/2021   Procedure: OPERATIVE ULTRASOUND;  Surgeon: Shannon Agent, MD;  Location[facility];  Service: Urology;  Laterality: N/A;   ROBOTIC ASSISTED TOTAL HYSTERECTOMY N/A 05/21/2021   Procedure: XI ROBOTIC ASSISTED TOTAL  HYSTERECTOMY;  Surgeon: Viktoria Comer SAUNDERS, MD;  Location: [facility];  Service: Gynecology;  Laterality: N/A;   TANDEM RING INSERTION N/A 04/09/2021   Procedure: TANDEM RING INSERTION;  Surgeon: Shannon Agent, MD;  Location: [facility];  Service: Urology;  Laterality: N/A;   TONSILLECTOMY     child   XI ROBOTIC ASSISTED OOPHORECTOMY N/A 01/24/2021   Procedure: XI ROBOTIC ASSISTED RIGHT OOPHORECTOMY, OOPHOROPEXY OF LEFT OVARY;  Surgeon: Viktoria Comer SAUNDERS, MD;  Location: [facility];  Service: Gynecology;  Laterality: N/A;   XI ROBOTIC ASSISTED OOPHORECTOMY Left 05/21/2021   Procedure: XI ROBOTIC ASSISTED LEFT OOPHORECTOMY;  Surgeon: Viktoria Comer SAUNDERS, MD;  Location: [facility];  Service: Gynecology;  Laterality: Left;   XI ROBOTIC ASSISTED SALPINGECTOMY Bilateral 01/24/2021   Procedure: XI ROBOTIC ASSISTED SALPINGECTOMY; RIGHT URETERAL LYSIS; LYSIS OF ADHESIONS;  Surgeon: Viktoria Comer SAUNDERS, MD;  Location: [facility];  Service: Gynecology;  Laterality: Bilateral;    Family History  Problem Relation Age of Onset   Asthma Mother    Diabetes Mother    Hypertension Mother    Breast cancer Mother    Asthma Father    Diabetes Father    Kidney disease Father    Diabetes Sister    Asthma Sister    Asthma Brother    Diabetes Brother    Cancer Maternal Aunt    Diabetes Maternal Uncle    Diabetes Maternal Grandmother    Hypertension Maternal Grandmother    Anemia Maternal Grandmother    Heart attack Paternal Grandfather    Cancer Paternal Grandfather    Colon cancer Neg Hx    Ovarian cancer Neg Hx    Endometrial cancer Neg Hx    Pancreatic cancer Neg Hx    Prostate cancer Neg Hx     Social History   Socioeconomic History   Marital status: Single    Spouse name: Not on file   Number of children: Not on file   Years of education: Not on file   Highest education level: Not on file  Occupational History   Not on file  Tobacco Use   Smoking status: Never   Smokeless tobacco:  Never  Vaping Use   Vaping status: Never Used  Substance and Sexual Activity   Alcohol use: Never   Drug use: Never   Sexual activity: Not Currently    Birth control/protection: None    Comment: female partner  Other Topics Concern   Not on file  Social History Narrative   Not on file   Social Drivers of Health   Financial Resource Strain: Not on file  Food Insecurity: Not on file  Transportation Needs: Not on file  Physical Activity: Not on file  Stress: Not on file  Social Connections: Not on file    Current Medications:  Current Outpatient Medications:    albuterol  (VENTOLIN  HFA) 108 (90 Base) MCG/ACT inhaler, Inhale 2 puffs into the lungs every 6 (six) hours as needed for wheezing or shortness of breath., Disp: 8 g, Rfl: 0   Ascorbic Acid (VITAMIN C) 500 MG CHEW, 1 capsule in the evening with iron  tablets Orally Once a day for 90 days, Disp: , Rfl:    estradiol  (VIVELLE -DOT) 0.1 MG/24HR patch,  Place 1 patch (0.1 mg total) onto the skin 2 (two) times a week., Disp: 8 patch, Rfl: 12   metFORMIN  (GLUCOPHAGE ) 500 MG tablet, Take 1 tablet (500 mg total) by mouth 2 (two) times daily with a meal., Disp: 60 tablet, Rfl: 1   omeprazole  (PRILOSEC) 20 MG capsule, Take 1 capsule (20 mg total) by mouth daily., Disp: 30 capsule, Rfl: 1   propranolol (INDERAL) 40 MG tablet, Take 40 mg by mouth daily as needed (migraines)., Disp: , Rfl:    sertraline  (ZOLOFT ) 50 MG tablet, Take 1 tablet (50 mg total) by mouth daily., Disp: 30 tablet, Rfl: 6   sulindac (CLINORIL) 150 MG tablet, Take 1 tablet (150 mg total) by mouth 2 (two) times daily., Disp: 60 tablet, Rfl: 5  Review of Systems: On ROS intake form, negative for new symptoms. Denies appetite changes, fevers, chills, fatigue, unexplained weight changes. Denies hearing loss, neck lumps or masses, mouth sores, ringing in ears or voice changes. Denies cough or wheezing.  Denies shortness of breath. Denies chest pain or palpitations. Denies  leg swelling. Denies abdominal distention, pain, blood in stools, constipation, diarrhea, nausea, vomiting, or early satiety. Denies pain with intercourse, dysuria, frequency, hematuria or incontinence. Denies hot flashes, pelvic pain, vaginal bleeding or vaginal discharge.   Denies joint pain, back pain or muscle pain/cramps. Denies itching, rash, or wounds. Denies dizziness, numbness or seizures. Denies swollen lymph nodes or glands, denies easy bruising or bleeding. Denies anxiety, depression, confusion, or decreased concentration.  Physical Exam: BP 135/62 (BP Location: Left Arm, Patient Position: Sitting)   Pulse 80   Temp 98.8 F (37.1 C) (Oral)   Resp 16   Ht 5' 6 (1.676 m)   Wt 283 lb (128.4 kg)   LMP 03/25/2021 (Approximate)   SpO2 98%   BMI 45.68 kg/m  General: Alert, oriented, no acute distress. HEENT: Normocephalic, atraumatic, sclera anicteric. Chest: Clear to auscultation bilaterally.  No wheezes or rhonchi. Cardiovascular: Heart rate and rhythm regular, no murmurs. Abdomen: Obese, soft, nontender.  Normoactive bowel sounds.  No masses or hepatosplenomegaly appreciated.  Well-healed incisions. Extremities: Grossly normal range of motion.  Warm, well perfused.  No edema bilaterally. There is mild edema around the right knee from recent fall and mild tenderness. Skin: No rashes or lesions noted. GU: Normal appearing external genitalia without erythema, excoriation, or lesions.  Speculum exam reveals minimally atrophic vaginal mucosa with radiation changes present.  Cuff intact, no lesions visible.  Bimanual exam reveals cuff intact, no masses or nodularity.    Laboratory & Radiologic Studies: Last pap 07/2023: ASCUS- HPV high risk negative. 05/2023: CT A/P Status post hysterectomy with no evidence for malignancy within the abdomen or pelvis. Hepatic steatosis with persistent nonspecific hepatosplenomegaly.  Assessment & Plan: Alyssa Becker is a 28 y.o. 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  PDL1 10%. Last pap  08/13/2023-ASCUS, HR HPV negative.   Patient is overall doing well and is NED on exam. She will continue on her current estrogen dose.   Per NCCN surveillance recommendations, we will continue with visits every 3 months.  These will include a pelvic exam.  Pap test and HPV testing will performed yearly.   20 minutes of total time was spent for this patient encounter, including preparation, face-to-face counseling with the patient and coordination of care, and documentation of the encounter.  Isaac Lacson  Barak Bialecki NP [facility] GYN Oncology

## 2024-01-10 ENCOUNTER — Encounter (HOSPITAL_COMMUNITY): Payer: Self-pay | Admitting: Emergency Medicine

## 2024-01-10 ENCOUNTER — Other Ambulatory Visit: Payer: Self-pay

## 2024-01-10 ENCOUNTER — Emergency Department (HOSPITAL_COMMUNITY)
Admission: EM | Admit: 2024-01-10 | Discharge: 2024-01-11 | Disposition: A | Attending: Emergency Medicine | Admitting: Emergency Medicine

## 2024-01-10 ENCOUNTER — Emergency Department (HOSPITAL_COMMUNITY)

## 2024-01-10 DIAGNOSIS — R0602 Shortness of breath: Secondary | ICD-10-CM | POA: Diagnosis present

## 2024-01-10 DIAGNOSIS — R739 Hyperglycemia, unspecified: Secondary | ICD-10-CM | POA: Insufficient documentation

## 2024-01-10 DIAGNOSIS — J45901 Unspecified asthma with (acute) exacerbation: Secondary | ICD-10-CM | POA: Diagnosis not present

## 2024-01-10 LAB — BASIC METABOLIC PANEL WITH GFR
Anion gap: 11 (ref 5–15)
BUN: 13 mg/dL (ref 6–20)
CO2: 25 mmol/L (ref 22–32)
Calcium: 9.1 mg/dL (ref 8.9–10.3)
Chloride: 98 mmol/L (ref 98–111)
Creatinine, Ser: 0.71 mg/dL (ref 0.44–1.00)
GFR, Estimated: >60 mL/min
Glucose, Bld: 387 mg/dL — ABNORMAL HIGH (ref 70–99)
Potassium: 4.8 mmol/L (ref 3.5–5.1)
Sodium: 135 mmol/L (ref 135–145)

## 2024-01-10 LAB — CBC WITH DIFFERENTIAL/PLATELET
Abs Immature Granulocytes: 0.03 K/uL (ref 0.00–0.07)
Basophils Absolute: 0.1 K/uL (ref 0.0–0.1)
Basophils Relative: 1 %
Eosinophils Absolute: 0.3 K/uL (ref 0.0–0.5)
Eosinophils Relative: 4 %
HCT: 47 % — ABNORMAL HIGH (ref 36.0–46.0)
Hemoglobin: 16.2 g/dL — ABNORMAL HIGH (ref 12.0–15.0)
Immature Granulocytes: 0 %
Lymphocytes Relative: 33 %
Lymphs Abs: 2.6 K/uL (ref 0.7–4.0)
MCH: 27.2 pg (ref 26.0–34.0)
MCHC: 34.5 g/dL (ref 30.0–36.0)
MCV: 79 fL — ABNORMAL LOW (ref 80.0–100.0)
Monocytes Absolute: 0.4 K/uL (ref 0.1–1.0)
Monocytes Relative: 5 %
Neutro Abs: 4.5 K/uL (ref 1.7–7.7)
Neutrophils Relative %: 57 %
Platelets: 291 K/uL (ref 150–400)
RBC: 5.95 MIL/uL — ABNORMAL HIGH (ref 3.87–5.11)
RDW: 13.8 % (ref 11.5–15.5)
WBC: 7.9 K/uL (ref 4.0–10.5)
nRBC: 0 % (ref 0.0–0.2)

## 2024-01-10 LAB — RESP PANEL BY RT-PCR (RSV, FLU A&B, COVID)  RVPGX2
Influenza A by PCR: NEGATIVE
Influenza B by PCR: NEGATIVE
Resp Syncytial Virus by PCR: NEGATIVE
SARS Coronavirus 2 by RT PCR: NEGATIVE

## 2024-01-10 MED ORDER — IPRATROPIUM-ALBUTEROL 0.5-2.5 (3) MG/3ML IN SOLN
6.0000 mL | Freq: Once | RESPIRATORY_TRACT | Status: AC
  Administered 2024-01-10: 6 mL via RESPIRATORY_TRACT
  Filled 2024-01-10: qty 6

## 2024-01-10 MED ORDER — DEXAMETHASONE SOD PHOSPHATE PF 10 MG/ML IJ SOLN
16.0000 mg | Freq: Once | INTRAMUSCULAR | Status: AC
  Administered 2024-01-10: 16 mg via INTRAVENOUS

## 2024-01-10 MED ORDER — ALBUTEROL SULFATE (2.5 MG/3ML) 0.083% IN NEBU
2.5000 mg | INHALATION_SOLUTION | Freq: Once | RESPIRATORY_TRACT | Status: AC
  Administered 2024-01-10: 2.5 mg via RESPIRATORY_TRACT
  Filled 2024-01-10: qty 3

## 2024-01-10 MED ORDER — INSULIN ASPART 100 UNIT/ML IJ SOLN
8.0000 [IU] | INTRAMUSCULAR | Status: AC
  Administered 2024-01-10: 8 [IU] via SUBCUTANEOUS
  Filled 2024-01-10: qty 1

## 2024-01-10 MED ORDER — LACTATED RINGERS IV BOLUS
1000.0000 mL | Freq: Once | INTRAVENOUS | Status: AC
  Administered 2024-01-10: 1000 mL via INTRAVENOUS

## 2024-01-10 MED ORDER — ALBUTEROL SULFATE HFA 108 (90 BASE) MCG/ACT IN AERS
2.0000 | INHALATION_SPRAY | Freq: Once | RESPIRATORY_TRACT | Status: AC
  Administered 2024-01-10: 2 via RESPIRATORY_TRACT
  Filled 2024-01-10: qty 6.7

## 2024-01-10 NOTE — ED Triage Notes (Signed)
 Pt to the ED with complaints of shortness of breath and a cough that began today.  Pt reports the cough as non-productive.  Pt reports chronic asthma and is wheezing during triage.  Pt states she is out of her rescue inhaler, but last had a nebulizer treatment at 1930 this evening.

## 2024-01-10 NOTE — Discharge Instructions (Signed)
 You were seen for your asthma exacerbation in the emergency department.   At home, please use your inhalers for any wheezing.  The Decadron  we have given you is a long acting steroid so you do not need additional doses at home.  Check your MyChart online for the results of any tests that had not resulted by the time you left the emergency department.   Follow-up with your primary doctor in 2-3 days regarding your visit.    Return immediately to the emergency department if you experience any of the following: Difficulty breathing, chest pain, or any other concerning symptoms.    Thank you for visiting our Emergency Department. It was a pleasure taking care of you today.

## 2024-01-10 NOTE — ED Provider Notes (Signed)
 " [place] EMERGENCY DEPARTMENT AT [facility] Provider Note   CSN: [id number] Arrival date & time: 01/10/24  2034     Patient presents with: Shortness of Breath   Alyssa Becker is a 28 y.o. female.   28 year old female history of asthma with no intubations who presents to the emergency department with shortness of breath and cough.  Started feeling sick today.  Dry cough.  No runny nose, sore throat, or fever.  No chest pain.  No leg swelling.  Not on hormones or OCPs.  No history of DVT or PE.  No history of cancer.  No surgery in the past month.  No hemoptysis.  Has been taking her albuterol  nebulizer at home without much relief       Prior to Admission medications  Medication Sig Start Date End Date Taking? Authorizing Provider  albuterol  (VENTOLIN  HFA) 108 (90 Base) MCG/ACT inhaler Inhale 2 puffs into the lungs every 6 (six) hours as needed for wheezing or shortness of breath. 05/17/22   Leath-Warren, Etta PARAS, NP  Ascorbic Acid (VITAMIN C) 500 MG CHEW 1 capsule in the evening with iron  tablets Orally Once a day for 90 days    [provider]  estradiol  (VIVELLE -DOT) 0.1 MG/24HR patch Place 1 patch (0.1 mg total) onto the skin 2 (two) times a week. 08/13/23   Viktoria Comer SAUNDERS, MD  metFORMIN  (GLUCOPHAGE ) 500 MG tablet Take 1 tablet (500 mg total) by mouth 2 (two) times daily with a meal. 10/03/20   Lorence Ozell CROME, MD  omeprazole  (PRILOSEC) 20 MG capsule Take 1 capsule (20 mg total) by mouth daily. 06/01/23   Theadore Ozell HERO, MD  propranolol (INDERAL) 40 MG tablet Take 40 mg by mouth daily as needed (migraines).    [provider]  sertraline  (ZOLOFT ) 50 MG tablet Take 1 tablet (50 mg total) by mouth daily. 12/12/22   Viktoria Comer SAUNDERS, MD  sulindac  (CLINORIL ) 150 MG tablet Take 1 tablet (150 mg total) by mouth 2 (two) times daily. 11/16/23   Margrette Taft BRAVO, MD  prochlorperazine  (COMPAZINE ) 10 MG tablet Take 1 tablet (10 mg total) by mouth  every 6 (six) hours as needed (Nausea or vomiting). Patient not taking: Reported on 02/18/2021 02/05/21 03/28/21  Lonn Hicks, MD    Allergies: Pineapple and Prednisone    Review of Systems  Updated Vital Signs BP (!) 110/54   Pulse 85   Temp 98.4 F (36.9 C) (Oral)   Resp 14   Ht 5' 6 (1.676 m)   Wt 127.9 kg   LMP 03/25/2021   SpO2 96%   BMI 45.52 kg/m   Physical Exam Vitals and nursing note reviewed.  Constitutional:      General: She is not in acute distress.    Appearance: She is well-developed.  HENT:     Head: Normocephalic and atraumatic.     Right Ear: External ear normal.     Left Ear: External ear normal.     Nose: Nose normal.  Eyes:     Extraocular Movements: Extraocular movements intact.     Conjunctiva/sclera: Conjunctivae normal.     Pupils: Pupils are equal, round, and reactive to light.  Cardiovascular:     Rate and Rhythm: Normal rate and regular rhythm.     Heart sounds: No murmur heard. Pulmonary:     Effort: Pulmonary effort is normal. No respiratory distress.     Breath sounds: Wheezing (Bilateral expiratory) present.  Musculoskeletal:  Cervical back: Normal range of motion and neck supple.     Right lower leg: No edema.     Left lower leg: No edema.  Skin:    General: Skin is warm and dry.  Neurological:     Mental Status: She is alert and oriented to person, place, and time. Mental status is at baseline.  Psychiatric:        Mood and Affect: Mood normal.     (all labs ordered are listed, but only abnormal results are displayed) Labs Reviewed  BASIC METABOLIC PANEL WITH GFR - Abnormal; Notable for the following components:      Result Value   Glucose, Bld 387 (*)    All other components within normal limits  CBC WITH DIFFERENTIAL/PLATELET - Abnormal; Notable for the following components:   RBC 5.95 (*)    Hemoglobin 16.2 (*)    HCT 47.0 (*)    MCV 79.0 (*)    All other components within normal limits  RESP PANEL BY RT-PCR (RSV,  FLU A&B, COVID)  [id number]    EKG: None  Radiology: DG Chest 2 View Result Date: 01/10/2024 CLINICAL DATA:  Chest pain and cough EXAM: CHEST - 2 VIEW COMPARISON:  05/31/2023 FINDINGS: The heart size and mediastinal contours are within normal limits. Both lungs are clear. The visualized skeletal structures are unremarkable. IMPRESSION: No active cardiopulmonary disease. Electronically Signed   By: Oneil Devonshire M.D.   On: 01/10/2024 22:02     Procedures   Medications Ordered in the ED  albuterol  (PROVENTIL ) (2.5 MG/3ML) 0.083% nebulizer solution 2.5 mg (2.5 mg Nebulization Given 01/10/24 2123)  ipratropium-albuterol  (DUONEB) 0.5-2.5 (3) MG/3ML nebulizer solution 6 mL (6 mLs Nebulization Given 01/10/24 2222)  dexamethasone  (DECADRON ) injection 16 mg (16 mg Intravenous Given 01/10/24 2211)  albuterol  (VENTOLIN  HFA) 108 (90 Base) MCG/ACT inhaler 2 puff (2 puffs Inhalation Given 01/10/24 2210)  lactated ringers  bolus 1,000 mL (1,000 mLs Intravenous New Bag/Given 01/10/24 2243)  insulin  aspart (novoLOG ) injection 8 Units (8 Units Subcutaneous Given 01/10/24 2239)                                    Medical Decision Making Amount and/or Complexity of Data Reviewed Labs: ordered. Radiology: ordered.  Risk Prescription drug management.   Alyssa Becker is a 28 year old female history of asthma with no intubations who presents to the emergency department with shortness of breath and cough  Initial Ddx:  Asthma exacerbation, URI, pneumonia, PE  MDM:  Feel the patient likely has an asthma exacerbation based on their symptoms.  On exam is not in acute distress.  Does have expiratory wheezing.  Satting well on room air.  Will obtain a COVID and flu to assess for URI.  Will also obtain a chest x-ray to evaluate for pneumonia or any other causes of their shortness of breath.  Given their findings on lung auscultation feel that the cause is likely due to obstructive airway disease rather than  a pulmonary embolism.  Does have a documented allergy to prednisone so we will give her Decadron  instead.  Plan:  Labs Chest x-ray COVID/flu Decadron  Nebs  ED Summary/Re-evaluation:  Labs were obtained which showed hyperglycemia.  No signs of DKA.  Hemoglobin was 16 indicating hemoconcentration.  This may be due to insensible losses and dehydration from her illness.  Was given IV fluids.  Also given rapid acting insulin  since she received Decadron .  No signs of an allergic reaction.  COVID and flu negative.  Chest x-ray without pneumonia.  Patient discharged home with an inhaler.  Will hold off on additional doses of steroid given the mild severity of her symptoms and hyperglycemia that she has at this point in time which I do not want to exacerbate.  This patient presents to the ED for concern of complaints listed in HPI, this involves an extensive number of treatment options, and is a complaint that carries with it a high risk of complications and morbidity. Disposition including potential need for admission considered.   Dispo: DC Home. Return precautions discussed including, but not limited to, those listed in the AVS. Allowed pt time to ask questions which were answered fully prior to dc.  Additional history obtained from family Records reviewed Outpatient Clinic Notes The following labs were independently interpreted: Chemistry and show Hyperglycemia I independently reviewed the following imaging with scope of interpretation limited to determining acute life threatening conditions related to emergency care: Chest x-ray and agree with the radiologist interpretation with the following exceptions: none I personally reviewed and interpreted cardiac monitoring: normal sinus rhythm  I personally reviewed and interpreted the pt's EKG: see above for interpretation  I have reviewed the patients home medications and made adjustments as needed   Final diagnoses:  Mild asthma with exacerbation,  unspecified whether persistent  Hyperglycemia    ED Discharge Orders     None          Yolande Lamar BROCKS, MD 01/10/24 2322  "

## 2024-01-10 NOTE — ED Notes (Signed)
 RN contacted respiratory to complete nebulizer.

## 2024-01-10 NOTE — ED Notes (Signed)
 Patient transported to X-ray

## 2024-03-18 ENCOUNTER — Inpatient Hospital Stay: Admitting: Gynecologic Oncology
